# Patient Record
Sex: Male | Born: 1960
Health system: Southern US, Community
[De-identification: ages and names within clinical notes are randomized; demographics above are authoritative.]

## PROBLEM LIST (undated history)

## (undated) DIAGNOSIS — S42009A Fracture of unspecified part of unspecified clavicle, initial encounter for closed fracture: Secondary | ICD-10-CM

## (undated) DIAGNOSIS — S42401A Unspecified fracture of lower end of right humerus, initial encounter for closed fracture: Secondary | ICD-10-CM

## (undated) DIAGNOSIS — F339 Major depressive disorder, recurrent, unspecified: Secondary | ICD-10-CM

## (undated) DIAGNOSIS — J439 Emphysema, unspecified: Secondary | ICD-10-CM

## (undated) DIAGNOSIS — M1990 Unspecified osteoarthritis, unspecified site: Secondary | ICD-10-CM

## (undated) DIAGNOSIS — C801 Malignant (primary) neoplasm, unspecified: Secondary | ICD-10-CM

## (undated) DIAGNOSIS — K5790 Diverticulosis of intestine, part unspecified, without perforation or abscess without bleeding: Secondary | ICD-10-CM

## (undated) DIAGNOSIS — K649 Unspecified hemorrhoids: Secondary | ICD-10-CM

## (undated) DIAGNOSIS — D126 Benign neoplasm of colon, unspecified: Secondary | ICD-10-CM

## (undated) DIAGNOSIS — J449 Chronic obstructive pulmonary disease, unspecified: Secondary | ICD-10-CM

## (undated) HISTORY — DX: Chronic obstructive pulmonary disease, unspecified: J44.9

## (undated) HISTORY — DX: Major depressive disorder, recurrent, unspecified: F33.9

## (undated) HISTORY — DX: Emphysema, unspecified: J43.9

## (undated) HISTORY — DX: Fracture of unspecified part of unspecified clavicle, initial encounter for closed fracture: S42.009A

## (undated) HISTORY — DX: Benign neoplasm of colon, unspecified: D12.6

## (undated) HISTORY — DX: Unspecified fracture of lower end of right humerus, initial encounter for closed fracture: S42.401A

## (undated) HISTORY — DX: Unspecified osteoarthritis, unspecified site: M19.90

## (undated) HISTORY — PX: POLYPECTOMY: SHX149

## (undated) HISTORY — PX: JOINT REPLACEMENT: SHX530

## (undated) HISTORY — DX: Unspecified hemorrhoids: K64.9

## (undated) HISTORY — DX: Diverticulosis of intestine, part unspecified, without perforation or abscess without bleeding: K57.90

## (undated) HISTORY — PX: COLONOSCOPY: SHX174

---
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
## 1981-10-28 HISTORY — PX: WISDOM TOOTH EXTRACTION: SHX21

## 1984-10-28 DIAGNOSIS — S42009A Fracture of unspecified part of unspecified clavicle, initial encounter for closed fracture: Secondary | ICD-10-CM

## 1984-10-28 HISTORY — DX: Fracture of unspecified part of unspecified clavicle, initial encounter for closed fracture: S42.009A

## 2010-12-28 ENCOUNTER — Ambulatory Visit: Payer: Self-pay | Admitting: Internal Medicine

## 2011-07-08 ENCOUNTER — Other Ambulatory Visit: Payer: Self-pay | Admitting: Internal Medicine

## 2011-07-13 ENCOUNTER — Other Ambulatory Visit: Payer: Self-pay | Admitting: Internal Medicine

## 2011-10-29 HISTORY — PX: OTHER SURGICAL HISTORY: SHX169

## 2012-02-21 ENCOUNTER — Encounter: Payer: Self-pay | Admitting: Internal Medicine

## 2012-02-21 ENCOUNTER — Ambulatory Visit (INDEPENDENT_AMBULATORY_CARE_PROVIDER_SITE_OTHER): Payer: BC Managed Care – PPO | Admitting: Internal Medicine

## 2012-02-21 VITALS — BP 130/72 | HR 82 | Temp 98.1°F | Ht 68.5 in | Wt 154.0 lb

## 2012-02-21 DIAGNOSIS — F419 Anxiety disorder, unspecified: Secondary | ICD-10-CM | POA: Insufficient documentation

## 2012-02-21 DIAGNOSIS — F411 Generalized anxiety disorder: Secondary | ICD-10-CM

## 2012-02-21 DIAGNOSIS — H6691 Otitis media, unspecified, right ear: Secondary | ICD-10-CM | POA: Insufficient documentation

## 2012-02-21 DIAGNOSIS — H6692 Otitis media, unspecified, left ear: Secondary | ICD-10-CM

## 2012-02-21 DIAGNOSIS — Z0000 Encounter for general adult medical examination without abnormal findings: Secondary | ICD-10-CM

## 2012-02-21 DIAGNOSIS — H6690 Otitis media, unspecified, unspecified ear: Secondary | ICD-10-CM

## 2012-02-21 MED ORDER — AMOXICILLIN-POT CLAVULANATE 875-125 MG PO TABS: 1.0000 | ORAL_TABLET | Freq: Two times a day (BID) | ORAL | Status: AC

## 2012-02-21 MED ORDER — BUPROPION HCL ER (XL) 150 MG PO TB24: 150.0000 mg | ORAL_TABLET | Freq: Every day | ORAL | Status: DC

## 2012-02-21 MED ORDER — CLONAZEPAM 0.5 MG PO TABS: 0.5000 mg | ORAL_TABLET | Freq: Three times a day (TID) | ORAL | Status: DC | PRN

## 2012-02-21 MED ORDER — ANTIPYRINE-BENZOCAINE 5.4-1.4 % OT SOLN: 3.0000 [drp] | OTIC | Status: AC | PRN

## 2012-02-21 NOTE — Assessment & Plan Note (Signed)
Will restart Wellbutrin and Klonopin as this has worked well for him in the past. Followup in 3 months or as needed.

## 2012-02-21 NOTE — Progress Notes (Signed)
Subjective:    Patient ID: Paul Fields, male    DOB: [birth year], 51 y.o.   MRN: [id number]  HPI 51 year old male with history of anxiety presents for followup. In regards to his anxiety, he has recently been out of his Wellbutrin and Klonopin. He would like to restart these medications, as he notes significant improvement in his anxiety in the past while using them. He reports that his home situation has improved somewhat since his last visit. He also notes that things are going well at work.  He is concerned today about right ear pain. This has been persistent for approximately one week. He notes a history in the past of recurrent ear infections in his right ear. He denies any fever or chills. He reports that the pain is worse when lying down on his right side. He denies any drainage from his ear. He denies any nasal congestion.  Outpatient Encounter Prescriptions as of 02/21/2012  Medication Sig Dispense Refill  . acetaminophen (TYLENOL) 500 MG tablet Take 500 mg by mouth every 6 (six) hours as needed.      . clonazePAM (KLONOPIN) 0.5 MG tablet Take 1 tablet (0.5 mg total) by mouth 3 (three) times daily as needed for anxiety.  60 tablet  3  . Naproxen Sodium (ALEVE) 220 MG CAPS Take 1 capsule by mouth as needed.      Marland Kitchen DISCONTD: clonazePAM (KLONOPIN) 0.5 MG tablet TAKE ONE TABLET BY MOUTH TWICE DAILY AS NEEDED  60 tablet  5  . amoxicillin-clavulanate (AUGMENTIN) 875-125 MG per tablet Take 1 tablet by mouth 2 (two) times daily.  20 tablet  0  . antipyrine-benzocaine (AURALGAN) otic solution Place 3 drops into both ears every 2 (two) hours as needed for pain.  10 mL  0  . buPROPion (WELLBUTRIN XL) 150 MG 24 hr tablet Take 1 tablet (150 mg total) by mouth daily.  30 tablet  6   BP 130/72  Pulse 82  Temp(Src) 98.1 F (36.7 C) (Oral)  Ht 5' 8.5" (1.74 m)  Wt 154 lb (69.854 kg)  BMI 23.08 kg/m2  SpO2 96%  Review of Systems  Constitutional: Negative for fever, chills, activity change,  appetite change, fatigue and unexpected weight change.  HENT: Positive for ear pain. Negative for congestion, rhinorrhea, postnasal drip and sinus pressure.   Eyes: Negative for visual disturbance.  Respiratory: Negative for cough and shortness of breath.   Cardiovascular: Negative for chest pain, palpitations and leg swelling.  Gastrointestinal: Negative for abdominal pain and abdominal distention.  Genitourinary: Negative for dysuria, urgency and difficulty urinating.  Musculoskeletal: Negative for arthralgias and gait problem.  Skin: Negative for color change and rash.  Hematological: Negative for adenopathy.  Psychiatric/Behavioral: Negative for sleep disturbance and dysphoric mood. The patient is nervous/anxious.        Objective:   Physical Exam  Constitutional: He is oriented to person, place, and time. He appears well-developed and well-nourished. No distress.  HENT:  Head: Normocephalic and atraumatic.  Right Ear: External ear normal.  Left Ear: External ear normal.  Nose: Nose normal.  Mouth/Throat: Oropharynx is clear and moist. No oropharyngeal exudate.       Both ear canals tortuous with some hair and cerumen obstructing view of TM.  Eyes: Conjunctivae and EOM are normal. Pupils are equal, round, and reactive to light. Right eye exhibits no discharge. Left eye exhibits no discharge. No scleral icterus.  Neck: Normal range of motion. Neck supple. No tracheal deviation present. No thyromegaly present.  Cardiovascular: Normal rate, regular rhythm and normal heart sounds.  Exam reveals no gallop and no friction rub.   No murmur heard. Pulmonary/Chest: Effort normal and breath sounds normal. No respiratory distress. He has no wheezes. He has no rales. He exhibits no tenderness.  Musculoskeletal: Normal range of motion. He exhibits no edema.  Lymphadenopathy:    He has no cervical adenopathy.  Neurological: He is alert and oriented to person, place, and time. No cranial nerve  deficit. Coordination normal.  Skin: Skin is warm and dry. No rash noted. He is not diaphoretic. No erythema. No pallor.  Psychiatric: He has a normal mood and affect. His behavior is normal. Judgment and thought content normal.          Assessment & Plan:

## 2012-02-21 NOTE — Assessment & Plan Note (Addendum)
Symptoms are most consistent with otitis media of the right ear. However, visualization of the TM is very difficult because of tortuous canal and some cerumen buildup. Will set him up with ENT for further evaluation. We will start Augmentin x10 days.

## 2012-05-22 ENCOUNTER — Other Ambulatory Visit (INDEPENDENT_AMBULATORY_CARE_PROVIDER_SITE_OTHER): Payer: BC Managed Care – PPO | Admitting: *Deleted

## 2012-05-22 DIAGNOSIS — Z0000 Encounter for general adult medical examination without abnormal findings: Secondary | ICD-10-CM

## 2012-05-22 LAB — COMPREHENSIVE METABOLIC PANEL
ALT: 19 U/L (ref 0–53)
Albumin: 4.2 g/dL (ref 3.5–5.2)
Alkaline Phosphatase: 49 U/L (ref 39–117)
Glucose, Bld: 102 mg/dL — ABNORMAL HIGH (ref 70–99)
Potassium: 4.1 mEq/L (ref 3.5–5.1)
Sodium: 137 mEq/L (ref 135–145)
Total Protein: 6.8 g/dL (ref 6.0–8.3)

## 2012-05-22 LAB — LIPID PANEL
Cholesterol: 169 mg/dL (ref 0–200)
LDL Cholesterol: 96 mg/dL (ref 0–99)
Total CHOL/HDL Ratio: 3

## 2012-05-27 ENCOUNTER — Encounter: Payer: Self-pay | Admitting: Internal Medicine

## 2012-05-27 ENCOUNTER — Ambulatory Visit (INDEPENDENT_AMBULATORY_CARE_PROVIDER_SITE_OTHER): Payer: BC Managed Care – PPO | Admitting: Internal Medicine

## 2012-05-27 VITALS — BP 140/80 | HR 78 | Temp 98.7°F | Ht 68.5 in | Wt 155.0 lb

## 2012-05-27 DIAGNOSIS — R233 Spontaneous ecchymoses: Secondary | ICD-10-CM

## 2012-05-27 DIAGNOSIS — Z0000 Encounter for general adult medical examination without abnormal findings: Secondary | ICD-10-CM | POA: Insufficient documentation

## 2012-05-27 DIAGNOSIS — Z720 Tobacco use: Secondary | ICD-10-CM

## 2012-05-27 DIAGNOSIS — F419 Anxiety disorder, unspecified: Secondary | ICD-10-CM

## 2012-05-27 DIAGNOSIS — Z1211 Encounter for screening for malignant neoplasm of colon: Secondary | ICD-10-CM

## 2012-05-27 DIAGNOSIS — F17200 Nicotine dependence, unspecified, uncomplicated: Secondary | ICD-10-CM

## 2012-05-27 DIAGNOSIS — F17210 Nicotine dependence, cigarettes, uncomplicated: Secondary | ICD-10-CM | POA: Insufficient documentation

## 2012-05-27 DIAGNOSIS — Z23 Encounter for immunization: Secondary | ICD-10-CM

## 2012-05-27 DIAGNOSIS — F411 Generalized anxiety disorder: Secondary | ICD-10-CM

## 2012-05-27 MED ORDER — VARENICLINE TARTRATE 0.5 MG X 11 & 1 MG X 42 PO MISC: ORAL | Status: AC

## 2012-05-27 NOTE — Assessment & Plan Note (Signed)
Patient reports spontaneous bruising over his arms however no abnormalities noted on exam today. Will check CBC and PTT with labs.

## 2012-05-27 NOTE — Assessment & Plan Note (Signed)
Encourage smoking cessation. Patient would like to try Chantix. Prescription written today. Patient will followup in 3 months.

## 2012-05-27 NOTE — Assessment & Plan Note (Signed)
General medical exam is normal today. Health maintenance is up to date except for colonoscopy which will be scheduled. Tetanus and pertussis vaccine given today. Pneumonia vaccine given today. Recent fasting lab work including kidney, liver function and cholesterol were normal.
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
## 2012-05-27 NOTE — Assessment & Plan Note (Signed)
Symptoms well controlled with current medications. Will continue. Followup in 3 months worsening or as needed.

## 2012-05-28 ENCOUNTER — Encounter: Payer: Self-pay | Admitting: Internal Medicine

## 2012-05-29 ENCOUNTER — Other Ambulatory Visit (INDEPENDENT_AMBULATORY_CARE_PROVIDER_SITE_OTHER): Payer: BC Managed Care – PPO | Admitting: *Deleted

## 2012-05-29 DIAGNOSIS — R233 Spontaneous ecchymoses: Secondary | ICD-10-CM

## 2012-05-29 LAB — CBC WITH DIFFERENTIAL/PLATELET
Basophils Absolute: 0 10*3/uL (ref 0.0–0.1)
Basophils Relative: 1 % (ref 0–1)
Eosinophils Relative: 2 % (ref 0–5)
Lymphocytes Relative: 28 % (ref 12–46)
Neutro Abs: 3.4 10*3/uL (ref 1.7–7.7)
Platelets: 203 10*3/uL (ref 150–400)
RDW: 13.4 % (ref 11.5–15.5)
WBC: 5.8 10*3/uL (ref 4.0–10.5)

## 2012-05-29 NOTE — Addendum Note (Signed)
Addended by: Jobie Quaker on: 05/29/2012 04:55 PM   Modules accepted: Orders

## 2012-05-30 LAB — APTT: aPTT: 30 seconds (ref 24–37)

## 2012-06-26 ENCOUNTER — Ambulatory Visit (AMBULATORY_SURGERY_CENTER): Payer: BC Managed Care – PPO

## 2012-06-26 ENCOUNTER — Encounter: Payer: Self-pay | Admitting: Internal Medicine

## 2012-06-26 VITALS — Ht 69.0 in | Wt 158.8 lb

## 2012-06-26 DIAGNOSIS — Z1211 Encounter for screening for malignant neoplasm of colon: Secondary | ICD-10-CM

## 2012-06-26 MED ORDER — NA SULFATE-K SULFATE-MG SULF 17.5-3.13-1.6 GM/177ML PO SOLN: 1.0000 | Freq: Once | ORAL | Status: DC

## 2012-07-10 ENCOUNTER — Encounter: Payer: Self-pay | Admitting: Internal Medicine

## 2012-07-10 ENCOUNTER — Ambulatory Visit (AMBULATORY_SURGERY_CENTER): Payer: BC Managed Care – PPO | Admitting: Internal Medicine

## 2012-07-10 ENCOUNTER — Encounter: Payer: Self-pay | Admitting: *Deleted

## 2012-07-10 VITALS — BP 144/75 | HR 73 | Temp 97.1°F | Resp 18 | Ht 68.0 in | Wt 160.0 lb

## 2012-07-10 DIAGNOSIS — K635 Polyp of colon: Secondary | ICD-10-CM

## 2012-07-10 DIAGNOSIS — D126 Benign neoplasm of colon, unspecified: Secondary | ICD-10-CM

## 2012-07-10 DIAGNOSIS — Z1211 Encounter for screening for malignant neoplasm of colon: Secondary | ICD-10-CM

## 2012-07-10 MED ORDER — SODIUM CHLORIDE 0.9 % IV SOLN: 500.0000 mL | INTRAVENOUS | Status: DC

## 2012-07-10 NOTE — Patient Instructions (Addendum)
Handouts were given to your care partner on polyps, diverticulosis, high fiber diet and hemorrhoids.  Please hold any aspirin, aspirin products and any anti-inflammatory medications for 3 weeks until 07/31/12 per Dr. Rhea Belton. You may resume other current medications today.  Please call first of next week to schedule a follow up appointment with Dr.  Rhea Belton for 1 month.  Please try to stop smoking.  Assistance phone numbers are on the picture page if needed. Please call if any questions or concerns.    YOU HAD AN ENDOSCOPIC PROCEDURE TODAY AT THE [facility]: Refer to the procedure report that was given to you for any specific questions about what was found during the examination.  If the procedure report does not answer your questions, please call your gastroenterologist to clarify.  If you requested that your care partner not be given the details of your procedure findings, then the procedure report has been included in a sealed envelope for you to review at your convenience later.  YOU SHOULD EXPECT: Some feelings of bloating in the abdomen. Passage of more gas than usual.  Walking can help get rid of the air that was put into your GI tract during the procedure and reduce the bloating. If you had a lower endoscopy (such as a colonoscopy or flexible sigmoidoscopy) you may notice spotting of blood in your stool or on the toilet paper. If you underwent a bowel prep for your procedure, then you may not have a normal bowel movement for a few days.  DIET: Your first meal following the procedure should be a light meal and then it is ok to progress to your normal diet.  A half-sandwich or bowl of soup is an example of a good first meal.  Heavy or fried foods are harder to digest and may make you feel nauseous or bloated.  Likewise meals heavy in dairy and vegetables can cause extra gas to form and this can also increase the bloating.  Drink plenty of fluids but you should avoid alcoholic beverages for  24 hours.  ACTIVITY: Your care partner should take you home directly after the procedure.  You should plan to take it easy, moving slowly for the rest of the day.  You can resume normal activity the day after the procedure however you should NOT DRIVE or use heavy machinery for 24 hours (because of the sedation medicines used during the test).    SYMPTOMS TO REPORT IMMEDIATELY: A gastroenterologist can be reached at any hour.  During normal business hours, 8:30 AM to 5:00 PM Monday through Friday, call [phone].  After hours and on weekends, please call the GI answering service at [phone] who will take a message and have the physician on call contact you.   Following lower endoscopy (colonoscopy or flexible sigmoidoscopy):  Excessive amounts of blood in the stool  Significant tenderness or worsening of abdominal pains  Swelling of the abdomen that is new, acute  Fever of 100F or higher    FOLLOW UP: If any biopsies were taken you will be contacted by phone or by letter within the next 1-3 weeks.  Call your gastroenterologist if you have not heard about the biopsies in 3 weeks.  Our staff will call the home number listed on your records the next business day following your procedure to check on you and address any questions or concerns that you may have at that time regarding the information given to you following your procedure. This is a  courtesy call and so if there is no answer at the home number and we have not heard from you through the emergency physician on call, we will assume that you have returned to your regular daily activities without incident.  SIGNATURES/CONFIDENTIALITY: You and/or your care partner have signed paperwork which will be entered into your electronic medical record.  These signatures attest to the fact that that the information above on your After Visit Summary has been reviewed and is understood.  Full responsibility of the confidentiality of this  discharge information lies with you and/or your care-partner.

## 2012-07-10 NOTE — Progress Notes (Signed)
PROPOFOL PER D MERRITT CRNA, ALL MEDS TITRATED PER CRNA DURING PROCEDURE. SEE SCANNED INTRA PROCEDURE REPORT. EWM 

## 2012-07-10 NOTE — Progress Notes (Signed)
No complaints noted in the recovery room. Maw  Patient did not experience any of the following events: a burn prior to discharge; a fall within the facility; wrong site/side/patient/procedure/implant event; or a hospital transfer or hospital admission upon discharge from the facility. (G8907) Patient did not have preoperative order for IV antibiotic SSI prophylaxis. (G8918)  

## 2012-07-10 NOTE — Op Note (Signed)
[facility] [address] [place], [address]   COLONOSCOPY PROCEDURE REPORT  PATIENT: Paul, Fields  MR#: [id number] BIRTHDATE: 18-Mar-1961 , 51  yrs. old GENDER: Male ENDOSCOPIST: Beverley Fiedler, MD REFERRED [id number], Jennifer PROCEDURE DATE:  07/10/2012 PROCEDURE:   Colonoscopy with snare polypectomy and Submucosal injection, any substance ASA CLASS:   Class III INDICATIONS:average risk screening. MEDICATIONS: MAC sedation, administered by CRNA and Propofol (Diprivan) 600 mg IV  DESCRIPTION OF PROCEDURE:   After the risks benefits and alternatives of the procedure were thoroughly explained, informed consent was obtained.  A digital rectal exam revealed no rectal mass.   The LB [id number]  endoscope was introduced through the anus and advanced to the cecum, which was identified by both the appendix and ileocecal valve. No adverse events experienced. The quality of the prep was Suprep good  The instrument was then slowly withdrawn as the colon was fully examined.      COLON FINDINGS: A sessile polyp measuring 10 mm in size was found at the cecum at the appendiceal orifice.  A saline + indigo carmine Injection was given to lift the mucosal wall.  A polypectomy was performed using snare cautery in piecemeal fashion  The resection was complete and the polyp tissue was completely retrieved.   A sessile polyp measuring 5 mm in size was found at the cecum.  A polypectomy was performed with a cold snare.  The resection was complete and the polyp tissue was completely retrieved.   A flat polyp measuring 15 mm in size was found in the ascending colon.  A saline + indigo carmine Injection was given to lift the mucosal wall.  A polypectomy was performed using snare cautery.  The resection was complete and the polyp tissue was completely retrieved.   Four sessile polyps measuring 5-10 mm in size were found in the ascending colon.  Polypectomy was performed  using cold snare and using hot snare.  All resections were complete and all polyp tissue was completely retrieved.   Three sessile polyps measuring 5-9 mm in size were found in the transverse colon. Polypectomy was performed using cold snare and using hot snare. All resections were complete and all polyp tissue was completely retrieved.   Two sessile polyps ranging between 3-19mm in size were found in the descending colon.  Polypectomy was performed using cold snare.  All resections were complete and all polyp tissue was completely retrieved.   Six sessile polyps measuring 5-12 mm in size were found in the sigmoid colon.  Polypectomy was performed using cold snare and using hot snare.  All resections were complete and all polyp tissue was completely retrieved.   Four sessile polyps ranging between 3-17mm in size were found in the rectosigmoid colon and rectum.  Polypectomy was performed using cold snare.  All resections were complete and all polyp tissue was completely retrieved.   Small internal hemorrhoids were found.   Mild diverticulosis was noted in the sigmoid colon.  Retroflexed views revealed internal hemorrhoids. The time to cecum=2 minutes 21 seconds.  Withdrawal time=48 minutes 10 seconds.  The scope was withdrawn and the procedure completed.  COMPLICATIONS: There were no complications.  ENDOSCOPIC IMPRESSION: 1.   Sessile polyp measuring 10 mm in size was found at the cecum.; polypectomy was performed using snare cautery 2.   Sessile polyp measuring 5 mm in size was found at the cecum; polypectomy was performed with a cold snare 3.   Flat polyp measuring 15  mm in size was found in the ascending colon; polypectomy was performed using snare cautery 4.   Four sessile polyps measuring 5-10 mm in size were found in the ascending colon; Polypectomy was performed using cold snare and using hot snare 5.   Three sessile polyps measuring 5-9 mm in size were found in the transverse  colon; Polypectomy was performed using cold snare and using hot snare 6.   Two sessile polyps ranging between 3-65mm in size were found in the descending colon; Polypectomy was performed using cold snare 7.   Six sessile polyps measuring 5-12 mm in size were found in the sigmoid colon; Polypectomy was performed using cold snare and using hot snare 8.   Four sessile polyps ranging between 3-52mm in size were found in the rectosigmoid colon and rectum; Polypectomy was performed using cold snare 9.   Small internal hemorrhoids 10.   Mild diverticulosis was noted in the sigmoid colon  RECOMMENDATIONS: 1.  Hold aspirin, aspirin products, and anti-inflammatory medication for 2 weeks. 2.  Await pathology results 3.  Call to schedule a follow-up appointment with GI Clinic 1 month 4.  You will receive a letter within 1-2 weeks with the results of your biopsy as well as final recommendations.  Please call my office if you have not received a letter after 3 weeks. 5.  One of your biggest health concerns is your smoking.  This increases your risk for most cancers and serious cardiovascular diseases such as strokes, heart attacks.  You should try your best to stop.  If you need assistance, please contact your PCP or Smoking Cessation Class at [facility] [phone]) or [facility] Quit-Line (1-800-QUIT-NOW). 6..   Repeat colonoscopy in 3-6 months given numerous polyps removed (1 in piecemeal fashion).   eSigned:  Beverley Fiedler, MD 07/10/2012 11:32 AM   cc: Ronna Polio MD and The Patient   PATIENT NAME:  Paul Fields, Paul Fields MR#: [id number]

## 2012-07-13 ENCOUNTER — Telehealth: Payer: Self-pay | Admitting: *Deleted

## 2012-07-13 NOTE — Telephone Encounter (Signed)
  Follow up Call-  Call back number 07/10/2012  Post procedure Call Back phone  # [phone]  Permission to leave phone message Yes     Left message.

## 2012-07-14 ENCOUNTER — Encounter: Payer: Self-pay | Admitting: Internal Medicine

## 2012-07-15 ENCOUNTER — Telehealth: Payer: Self-pay | Admitting: *Deleted

## 2012-07-15 DIAGNOSIS — K635 Polyp of colon: Secondary | ICD-10-CM

## 2012-07-15 NOTE — Telephone Encounter (Signed)
Informed pt of his referral to a [employer] [facility] d/t his numerous polyps; pt stated understanding.

## 2012-07-15 NOTE — Telephone Encounter (Signed)
Message copied by Florene Glen on Wed Jul 15, 2012  9:03 AM ------      Message from: Beverley Fiedler      Created: Tue Jul 14, 2012  2:59 PM       Multiple tubular adenomas on first colonoscopy.      Please refer to genetics for evaluation of polyposis syndrome/hereditary polyp syndrome.      Letter sent

## 2012-08-10 ENCOUNTER — Encounter: Payer: Self-pay | Admitting: Internal Medicine

## 2012-08-11 ENCOUNTER — Telehealth: Payer: Self-pay | Admitting: Genetic Counselor

## 2012-08-11 ENCOUNTER — Encounter: Payer: Self-pay | Admitting: Internal Medicine

## 2012-08-11 ENCOUNTER — Ambulatory Visit (INDEPENDENT_AMBULATORY_CARE_PROVIDER_SITE_OTHER): Payer: BC Managed Care – PPO | Admitting: Internal Medicine

## 2012-08-11 VITALS — BP 130/80 | HR 70 | Ht 68.5 in | Wt 158.6 lb

## 2012-08-11 DIAGNOSIS — Z86010 Personal history of colonic polyps: Secondary | ICD-10-CM

## 2012-08-11 DIAGNOSIS — Z720 Tobacco use: Secondary | ICD-10-CM

## 2012-08-11 DIAGNOSIS — F17200 Nicotine dependence, unspecified, uncomplicated: Secondary | ICD-10-CM

## 2012-08-11 NOTE — Progress Notes (Signed)
Patient ID: Paul Fields, male   DOB: Jul 22, 1961, 51 y.o.   MRN: [id number]  SUBJECTIVE: HPI [name] is a 51 yo male with PMH of multiple adenomatous colon polyps, mild COPD, and tobacco use who is seen in followup after colonoscopy. He is accompanied today by his wife. He had a colonoscopy on 07/10/2012 for multiple adenomatous colon polyps removed throughout his colon. He had a piecemeal polypectomy in his cecum. He reports he did well after the procedure without bleeding. He denies abdominal pain today. No nausea or vomiting. Appetite is good and weight is stable. His bowel patterns are unchanged which for him this every other day bowel movements which are more loose than formed but without blood or melena.  He is not aware of any family history of colon cancer or polyps. He has 3 children all younger than age 62. No fevers or chills. He is smoking but plans on starting Chantix soon. He has tried this in the past with some success but relapsed and began smoking again. He was referred to genetics but never found out about the appointment. This was rescheduled for him today for early December  Review of Systems  As per history of present illness, otherwise negative   Past Medical History  Diagnosis Date  . Elbow fracture, right 1968  . Clavicle fracture 1986  . COPD (chronic obstructive pulmonary disease)   . Diverticulosis   . Hemorrhoid     Current Outpatient Prescriptions  Medication Sig Dispense Refill  . acetaminophen (TYLENOL) 500 MG tablet Take 500 mg by mouth every 6 (six) hours as needed.      Marland Kitchen buPROPion (WELLBUTRIN XL) 150 MG 24 hr tablet Take 1 tablet (150 mg total) by mouth daily.  30 tablet  6  . clonazePAM (KLONOPIN) 0.5 MG tablet Take 1 tablet (0.5 mg total) by mouth 3 (three) times daily as needed for anxiety.  60 tablet  3  . Naproxen Sodium (ALEVE) 220 MG CAPS Take 1 capsule by mouth as needed.      . varenicline (CHANTIX STARTING MONTH PAK) 0.5 MG X 11 & 1 MG X 42  tablet Take one 0.5 mg tablet by mouth once daily for 3 days, then increase to one 0.5 mg tablet twice daily for 4 days, then increase to one 1 mg tablet twice daily.  53 tablet  0    No Known Allergies  Family History  Problem Relation Age of Onset  . COPD Father   . Colon cancer Neg Hx   . Parkinsonism Mother   . Diabetes Mother     borderline    History  Substance Use Topics  . Smoking status: Current Every Day Smoker -- 1.5 packs/day    Types: Cigarettes  . Smokeless tobacco: Never Used   Comment: tobacco info given 08/11/12  . Alcohol Use: 3.6 oz/week    6 Cans of beer per week     6 pk beer on weekends    OBJECTIVE: BP 130/80  Pulse 70  Ht 5' 8.5" (1.74 m)  Wt 158 lb 9.6 oz (71.94 kg)  BMI 23.76 kg/m2 Constitutional: Well-developed and well-nourished. No distress. HEENT: Normocephalic and atraumatic. Oropharynx is clear and moist. No oropharyngeal exudate. Conjunctivae are normal. Pupils are equal round and reactive to light. No scleral icterus. Cardiovascular: Normal rate, regular rhythm and intact distal pulses. No M/R/G Pulmonary/chest: Effort normal and breath sounds normal. No wheezing, rales or rhonchi. Abdominal: Soft, nontender, nondistended. Bowel sounds active throughout. There are  no masses palpable. No hepatosplenomegaly. Extremities: no clubbing, cyanosis, or edema Neurological: Alert and oriented to person place and time. Skin: Skin is warm and dry. No rashes noted. Psychiatric: Normal mood and affect. Behavior is normal.  ASSESSMENT AND PLAN: 51 yo male with PMH of multiple adenomatous colon polyps, mild COPD, and tobacco use who is seen in followup after colonoscopy.  1.  Multiple adenomatous colon polyps/surveillance colonoscopy -- the patient had multiple adenomatous polyps, one removed piecemeal from the cecum. He is aware of my recommendation a repeat the test in 3-6 months to ensure complete resection remove any remaining polyps. He would prefer  this test be done in December 2013, which is within the appropriate time interval. It will be scheduled in December for him. He is aware of our referral to genetics and plans to attend this appointment in December. We will await their recommendations. He is aware that he will be high risk from a screening and surveillance standpoint going forward and his repeat interval will be based on the findings at his next colonoscopy.  2.  Tobacco use -- he is again encouraged to discontinue smoking and is aware of the association of smoking and colon polyps. He plans to start Chantix soon and we were able to get him an insurance savings card from primary care for this medication.

## 2012-08-11 NOTE — Telephone Encounter (Signed)
Referring office called to schedule appt for Genetic pt was in office.  12/02 @ 11 will send out calendar/packet.

## 2012-08-11 NOTE — Patient Instructions (Addendum)
You have been scheduled for a colonoscopy with propofol. Please follow written instructions given to you at your visit today.  Please pick up your prep kit at the pharmacy within the next 1-3 days. If you use inhalers (even only as needed), please bring them with you on the day of your procedure.  

## 2012-09-28 ENCOUNTER — Other Ambulatory Visit: Payer: BC Managed Care – PPO | Admitting: Lab

## 2012-09-28 ENCOUNTER — Ambulatory Visit (HOSPITAL_BASED_OUTPATIENT_CLINIC_OR_DEPARTMENT_OTHER): Payer: BC Managed Care – PPO | Admitting: Genetic Counselor

## 2012-09-28 ENCOUNTER — Encounter: Payer: Self-pay | Admitting: Genetic Counselor

## 2012-09-28 DIAGNOSIS — K635 Polyp of colon: Secondary | ICD-10-CM

## 2012-09-28 DIAGNOSIS — IMO0002 Reserved for concepts with insufficient information to code with codable children: Secondary | ICD-10-CM

## 2012-09-28 DIAGNOSIS — Z8042 Family history of malignant neoplasm of prostate: Secondary | ICD-10-CM

## 2012-09-28 DIAGNOSIS — Z800 Family history of malignant neoplasm of digestive organs: Secondary | ICD-10-CM

## 2012-09-28 DIAGNOSIS — Z86010 Personal history of colonic polyps: Secondary | ICD-10-CM

## 2012-09-28 NOTE — Progress Notes (Signed)
Dr. Erick Blinks requested a consultation for genetic counseling and risk assessment for Paul Fields, a 51 y.o. male, for discussion of his personal history of colon polyps. He presents to clinic today to discuss the possibility of a genetic predisposition to cancer, and to further clarify his risks, as well as his family members' risks for cancer.   HISTORY OF PRESENT ILLNESS: Paul Fields is a 51 y.o. male with no personal history of cancer.  A colonoscopy in September 2013 found innumerable polyps.  A total of 20 were biopsied and found to be tubular adenomas.  Past Medical History  Diagnosis Date  . Elbow fracture, right 1968  . Clavicle fracture 1986  . COPD (chronic obstructive pulmonary disease)   . Diverticulosis   . Hemorrhoid     Past Surgical History  Procedure Date  . Elbow fracture surgery     Pins, then hardware removal  . Elbow hardware removal     right  . Wisdom tooth extraction   . Polyps 2013    20 polyps biopsies as tubular adenomas    History  Substance Use Topics  . Smoking status: Current Every Day Smoker -- 1.5 packs/day for 36 years    Types: Cigarettes  . Smokeless tobacco: Never Used     Comment: tobacco info given 08/11/12  . Alcohol Use: 3.6 oz/week    6 Cans of beer per week     Comment: 6 pk beer on weekends    PERSONAL RISK ASSESSMENT FACTORS: Colonoscopy in late 20s for bleeding - no polyps found Colonoscopy September 2013 - innumerable polyps, 20 bx - tubular adenomas Current, every day smoker - for 36 years.  Heaviest smoking 3 packs per day   FAMILY HISTORY:  We obtained a detailed, 4-generation family history.  Significant diagnoses are listed below: Family History  Problem Relation Age of Onset  . COPD Father   . Parkinsonism Mother   . Diabetes Mother     borderline  . Colon cancer Maternal Uncle     diagnosed in his 60s  . Alzheimer's disease Paternal Aunt   . COPD Paternal Uncle   . Lung cancer Maternal Grandfather       Forensic scientist  . Prostate cancer Maternal Uncle     diagnosed in his 71s  . Alzheimer's disease Paternal Aunt   . Heart attack Paternal Uncle   . Brain cancer Paternal Uncle 72  . Lymphoma Cousin     paternal cousin  The patient was found to have 20 tubular adenoms.  His mother was diagnosed at age 49 with cervical cancer.  She had two brothers - one developed colon cancer in his 46s and then again in his 34s and he died at 71.  The other is 25 and has prostate cancer.  The patient's maternal grandfather was diagnosed with lung cancer, but was a Forensic scientist. The patient's father had COPD and he thinks possibly lung cancer.  His father had three brothers and two sisters.  One brother has a diangosis of brain cancer at age 80.  Two brothers died and the two sisters have dementia.  The patient has a paternal cousin with lymphoma.  There is no other reported history of cancer.  Patient's maternal ancestors are of [place] descent, and paternal ancestors are of [place], [place] and [place] descent. There is no reported Ashkenazi Jewish ancestry. There is no known consanguinity.  GENETIC COUNSELING RISK ASSESSMENT, DISCUSSION, AND SUGGESTED FOLLOW UP: We reviewed the natural  history and genetic etiology of sporadic, familial and hereditary cancer syndromes. About 10% of colon cancer is hereditary.  The most common form of colon cancer associated with overwhelming polyps is a result of mutations in the APC gene.  We discussed that there are multiple hereditary polyposis genes and that we could test them in a panel.  We will consider the colon cancer panel from [facility].  The patient's personal history of colon polyps and family history of colon cancer is suggestive of the following possible diagnosis: hereditary colon cancer syndrome  We discussed that identification of a hereditary cancer syndrome may help his care providers tailor the patients medical management. If a mutation indicating hereditary  cancer syndrome is detected in this case, the [employer] recommendations would include increased cancer surveillance and possible prophylactic surgery. If a mutation is detected, the patient will be referred back to the referring provider and to any additional appropriate care providers to discuss the relevant options.   If a mutation is not found in the patient, this will decrease the likelihood of a hereditary colon cancer syndrome as the explanation for his colon polyps. Cancer surveillance options would be discussed for the patient according to the appropriate standard National Comprehensive Cancer Network and American Cancer Society guidelines, with consideration of their personal and family history risk factors. In this case, the patient will be referred back to their care providers for discussions of management.   After considering the risks, benefits, and limitations, the patient provided informed consent for  the following  testing: Colon Cancer Panel through [facility].   Per the patient's request, we will contact him by telephone to discuss these results. A follow up genetic counseling visit will be scheduled if indicated.  The patient was seen for a total of 60 minutes, greater than 50% of which was spent face-to-face counseling.  This plan is being carried out per Dr. Erick Blinks recommendations.  This note will also be sent to the referring provider via the electronic medical record. The patient will be supplied with a summary of this genetic counseling discussion as well as educational information on the discussed hereditary cancer syndromes following the conclusion of their visit.   Patient was discussed with Dr. Drue Second.   _______________________________________________________________________ For Office Staff:  Number of people involved in session: 3 Was an Intern/ student involved with case: no

## 2012-10-12 ENCOUNTER — Ambulatory Visit (AMBULATORY_SURGERY_CENTER): Payer: BC Managed Care – PPO | Admitting: Internal Medicine

## 2012-10-12 ENCOUNTER — Encounter: Payer: Self-pay | Admitting: Internal Medicine

## 2012-10-12 VITALS — BP 121/69 | HR 57 | Temp 97.9°F | Resp 15 | Ht 68.5 in | Wt 158.0 lb

## 2012-10-12 DIAGNOSIS — Z86010 Personal history of colonic polyps: Secondary | ICD-10-CM

## 2012-10-12 DIAGNOSIS — D126 Benign neoplasm of colon, unspecified: Secondary | ICD-10-CM

## 2012-10-12 MED ORDER — SODIUM CHLORIDE 0.9 % IV SOLN: 500.0000 mL | INTRAVENOUS | Status: DC

## 2012-10-12 NOTE — Progress Notes (Signed)
Called to room to assist during endoscopic procedure.  Patient ID and intended procedure confirmed with present staff. Received instructions for my participation in the procedure from the performing physician.  

## 2012-10-12 NOTE — Progress Notes (Signed)
Patient did not experience any of the following events: a burn prior to discharge; a fall within the facility; wrong site/side/patient/procedure/implant event; or a hospital transfer or hospital admission upon discharge from the facility. (G8907) Patient did not have preoperative order for IV antibiotic SSI prophylaxis. (G8918)  

## 2012-10-12 NOTE — Patient Instructions (Signed)
Colon polyps removed today. Hold aspirin, aspirin products, and anti-inflammatory medications for 2 weeks!! Repeat colonoscopy in 1 year. Call us with any questions or concerns. Thank you!!  YOU HAD AN ENDOSCOPIC PROCEDURE TODAY AT THE [facility]: Refer to the procedure report that was given to you for any specific questions about what was found during the examination.  If the procedure report does not answer your questions, please call your gastroenterologist to clarify.  If you requested that your care partner not be given the details of your procedure findings, then the procedure report has been included in a sealed envelope for you to review at your convenience later.  YOU SHOULD EXPECT: Some feelings of bloating in the abdomen. Passage of more gas than usual.  Walking can help get rid of the air that was put into your GI tract during the procedure and reduce the bloating. If you had a lower endoscopy (such as a colonoscopy or flexible sigmoidoscopy) you may notice spotting of blood in your stool or on the toilet paper. If you underwent a bowel prep for your procedure, then you may not have a normal bowel movement for a few days.  DIET: Your first meal following the procedure should be a light meal and then it is ok to progress to your normal diet.  A half-sandwich or bowl of soup is an example of a good first meal.  Heavy or fried foods are harder to digest and may make you feel nauseous or bloated.  Likewise meals heavy in dairy and vegetables can cause extra gas to form and this can also increase the bloating.  Drink plenty of fluids but you should avoid alcoholic beverages for 24 hours.  ACTIVITY: Your care partner should take you home directly after the procedure.  You should plan to take it easy, moving slowly for the rest of the day.  You can resume normal activity the day after the procedure however you should NOT DRIVE or use heavy machinery for 24 hours (because of the sedation  medicines used during the test).    SYMPTOMS TO REPORT IMMEDIATELY: A gastroenterologist can be reached at any hour.  During normal business hours, 8:30 AM to 5:00 PM Monday through Friday, call [phone].  After hours and on weekends, please call the GI answering service at [phone] who will take a message and have the physician on call contact you.   Following lower endoscopy (colonoscopy or flexible sigmoidoscopy):  Excessive amounts of blood in the stool  Significant tenderness or worsening of abdominal pains  Swelling of the abdomen that is new, acute  Fever of 100F or higher  FOLLOW UP: If any biopsies were taken you will be contacted by phone or by letter within the next 1-3 weeks.  Call your gastroenterologist if you have not heard about the biopsies in 3 weeks.  Our staff will call the home number listed on your records the next business day following your procedure to check on you and address any questions or concerns that you may have at that time regarding the information given to you following your procedure. This is a courtesy call and so if there is no answer at the home number and we have not heard from you through the emergency physician on call, we will assume that you have returned to your regular daily activities without incident.  SIGNATURES/CONFIDENTIALITY: You and/or your care partner have signed paperwork which will be entered into your electronic medical record.  These  signatures attest to the fact that that the information above on your After Visit Summary has been reviewed and is understood.  Full responsibility of the confidentiality of this discharge information lies with you and/or your care-partner.

## 2012-10-12 NOTE — Op Note (Signed)
[facility] [address] [place], [address]   COLONOSCOPY PROCEDURE REPORT  PATIENT: Paul Fields, Paul Fields  MR#: [id number] BIRTHDATE: 10/13/1961 , 51  yrs. old GENDER: Male ENDOSCOPIST: Beverley Fiedler, MD REFERRED BY: PROCEDURE DATE:  10/12/2012 PROCEDURE:   Colonoscopy with snare polypectomy ASA CLASS:   Class III INDICATIONS:Patient's personal history of adenomatous colon polyps and follow up for previously diagnosed colonic polyps. MEDICATIONS: MAC sedation, administered by CRNA and Propofol (Diprivan) 480 mg IV  DESCRIPTION OF PROCEDURE:   After the risks benefits and alternatives of the procedure were thoroughly explained, informed consent was obtained.  A digital rectal exam revealed no rectal mass.   The LB [id number]  endoscope was introduced through the anus and advanced to the cecum, which was identified by both the appendix and ileocecal valve. No adverse events experienced. The quality of the prep was Moviprep fair  The instrument was then slowly withdrawn as the colon was fully examined.      COLON FINDINGS: The colonic mucosa appeared normal at the appendiceal orifice (previous site of piecemeal polypectomy). Multiple biopsies of the area were performed.   A sessile polyp measuring 5 mm in size was found in the ascending colon.  A polypectomy was performed with a cold snare.  The resection was complete and the polyp tissue was completely retrieved.   A sessile polyp measuring 6 mm in size was found in the transverse colon.  A polypectomy was performed using snare cautery.  The resection was complete and the polyp tissue was completely retrieved.   Two sessile polyps measuring 5-6 mm in size were found in the descending colon.  Polypectomy was performed using hot snare.  All resections were complete and all polyp tissue was completely retrieved.   Ten sessile polyps ranging between 3-55mm in size were found in the sigmoid colon and rectum.   Polypectomy was performed using cold snare and using hot snare.  All resections were complete and all polyp tissue was completely retrieved.  Retroflexed views revealed internal hemorrhoids. The time to cecum=4 minutes 30 seconds.  Withdrawal time=42 minutes 18 seconds.  The scope was withdrawn and the procedure completed.  COMPLICATIONS: There were no complications.  ENDOSCOPIC IMPRESSION: 1.   The colonic mucosa appeared normal at the appendiceal orifice (site of prior polypectomy); multiple biopsies of the area were performed 2.   Sessile polyp measuring 5 mm in size was found in the ascending colon; polypectomy was performed with a cold snare 3.   Sessile polyp measuring 6 mm in size was found in the transverse colon; polypectomy was performed using snare cautery 4.   Two sessile polyps measuring 5-6 mm in size were found in the descending colon; Polypectomy was performed using hot snare 5.   Ten sessile polyps ranging between 3-9mm in size were found in the sigmoid colon and rectum; Polypectomy was performed using cold snare and using hot snare  RECOMMENDATIONS: 1.  Hold aspirin, aspirin products, and anti-inflammatory medication for 2 weeks. 2.  Await pathology results 3.  Await results of genetics testing 4.  Repeat Colonoscopy in 1 year in 1 HOUR SLOT.   eSigned:  Beverley Fiedler, MD 10/12/2012 2:39 PM   cc: Ronna Polio MD and The Patient   PATIENT NAME:  Vasil, Juhasz MR#: [id number]

## 2012-10-13 ENCOUNTER — Telehealth: Payer: Self-pay

## 2012-10-13 NOTE — Telephone Encounter (Signed)
Left a message on the pt's answering machine at [phone] to call if any questions or concerns. Maw

## 2012-10-19 ENCOUNTER — Encounter: Payer: Self-pay | Admitting: Internal Medicine

## 2012-12-08 ENCOUNTER — Telehealth: Payer: Self-pay | Admitting: Genetic Counselor

## 2012-12-08 NOTE — Telephone Encounter (Signed)
Left good news message about genetic test results and asked to call me back.

## 2012-12-17 ENCOUNTER — Telehealth: Payer: Self-pay | Admitting: Genetic Counselor

## 2012-12-17 NOTE — Telephone Encounter (Signed)
Left good news message and asked that he CB

## 2013-01-01 ENCOUNTER — Encounter: Payer: Self-pay | Admitting: Genetic Counselor

## 2013-06-01 ENCOUNTER — Ambulatory Visit (INDEPENDENT_AMBULATORY_CARE_PROVIDER_SITE_OTHER): Payer: Managed Care, Other (non HMO) | Admitting: Adult Health

## 2013-06-01 ENCOUNTER — Encounter: Payer: Self-pay | Admitting: Adult Health

## 2013-06-01 VITALS — BP 108/60 | HR 76 | Temp 98.0°F | Resp 12 | Wt 154.5 lb

## 2013-06-01 DIAGNOSIS — R3129 Other microscopic hematuria: Secondary | ICD-10-CM | POA: Insufficient documentation

## 2013-06-01 LAB — POCT URINALYSIS DIPSTICK
Blood, UA: NEGATIVE
Glucose, UA: NEGATIVE
Leukocytes, UA: NEGATIVE
Nitrite, UA: NEGATIVE
Urobilinogen, UA: 0.2

## 2013-06-01 NOTE — Progress Notes (Signed)
  Subjective:    Patient ID: Paul Fields, male    DOB: January 24, 1961, 53 y.o.   MRN: [id number]  HPI  Patient reasons to clinic with report that he had microscopic hematuria on DOT physical. He was asked to follow up with his PCP. He denies any urinary symptoms of dysuria, flank pain, hematuria, fever or chills.   Review of Systems  Constitutional: Negative for fever and chills.  Genitourinary: Negative for dysuria, urgency, frequency, hematuria and flank pain.    BP 108/60  Pulse 76  Temp(Src) 98 F (36.7 C) (Oral)  Resp 12  Wt 154 lb 8 oz (70.081 kg)  BMI 23.15 kg/m2  SpO2 97%    Objective:   Physical Exam  Constitutional: He is oriented to person, place, and time. He appears well-developed and well-nourished.  Cardiovascular: Normal rate, regular rhythm and normal heart sounds.  Exam reveals no gallop.   No murmur heard. Pulmonary/Chest: Effort normal and breath sounds normal. No respiratory distress. He has no wheezes. He has no rales.  Abdominal: Soft. Bowel sounds are normal.  Neurological: He is alert and oriented to person, place, and time.  Skin: Skin is warm and dry.  Psychiatric: He has a normal mood and affect. His behavior is normal. Judgment and thought content normal.          Assessment & Plan:

## 2013-06-01 NOTE — Patient Instructions (Addendum)
  Please remember to schedule your yearly physical with Dr. walker.

## 2013-06-01 NOTE — Assessment & Plan Note (Signed)
Repeat exam is negative for microscopic hematuria.

## 2013-06-14 ENCOUNTER — Telehealth: Payer: Self-pay | Admitting: *Deleted

## 2013-06-14 NOTE — Telephone Encounter (Signed)
Patient left a message asking if you would prescribe him some Chantix?

## 2013-06-14 NOTE — Telephone Encounter (Signed)
OK. Fine to call in Chantix starter pack. Pt will need to set up a follow up visit within the next month. Please make sure he realizes that Chantix can lead to increased anxiety, vivid dreams.

## 2013-06-14 NOTE — Telephone Encounter (Signed)
Spoke with patient and he has taken it before, stated it was a long time ago. It worked for him, he just stopped using it.

## 2013-06-14 NOTE — Telephone Encounter (Signed)
Has he taken this before?

## 2013-06-15 MED ORDER — VARENICLINE TARTRATE 0.5 MG X 11 & 1 MG X 42 PO MISC: ORAL | Status: DC

## 2013-06-15 NOTE — Telephone Encounter (Signed)
Patient informed and verbalized understanding. Rx sent to pharmacy and patient will call back to schedule appointment.

## 2013-07-21 ENCOUNTER — Encounter: Payer: Self-pay | Admitting: Internal Medicine

## 2013-09-10 ENCOUNTER — Ambulatory Visit (INDEPENDENT_AMBULATORY_CARE_PROVIDER_SITE_OTHER): Payer: Managed Care, Other (non HMO) | Admitting: Internal Medicine

## 2013-09-10 ENCOUNTER — Encounter: Payer: Self-pay | Admitting: Internal Medicine

## 2013-09-10 VITALS — BP 136/82 | HR 68 | Temp 98.1°F | Resp 12 | Ht 69.5 in | Wt 159.5 lb

## 2013-09-10 DIAGNOSIS — Z0000 Encounter for general adult medical examination without abnormal findings: Secondary | ICD-10-CM

## 2013-09-10 DIAGNOSIS — Z23 Encounter for immunization: Secondary | ICD-10-CM

## 2013-09-10 DIAGNOSIS — Z86010 Personal history of colonic polyps: Secondary | ICD-10-CM

## 2013-09-10 LAB — LIPID PANEL
Cholesterol: 170 mg/dL (ref 0–200)
LDL Cholesterol: 107 mg/dL — ABNORMAL HIGH (ref 0–99)
Triglycerides: 44 mg/dL (ref 0.0–149.0)
VLDL: 8.8 mg/dL (ref 0.0–40.0)

## 2013-09-10 LAB — CBC WITH DIFFERENTIAL/PLATELET
Basophils Absolute: 0 10*3/uL (ref 0.0–0.1)
Basophils Relative: 0.7 % (ref 0.0–3.0)
Eosinophils Absolute: 0.1 10*3/uL (ref 0.0–0.7)
Eosinophils Relative: 2.6 % (ref 0.0–5.0)
Hemoglobin: 16.2 g/dL (ref 13.0–17.0)
Lymphocytes Relative: 31.8 % (ref 12.0–46.0)
Lymphs Abs: 1.8 10*3/uL (ref 0.7–4.0)
MCHC: 34.2 g/dL (ref 30.0–36.0)
MCV: 97.6 fl (ref 78.0–100.0)
Monocytes Absolute: 0.4 10*3/uL (ref 0.1–1.0)
Neutro Abs: 3.3 10*3/uL (ref 1.4–7.7)
Neutrophils Relative %: 58.3 % (ref 43.0–77.0)
Platelets: 223 10*3/uL (ref 150.0–400.0)
RBC: 4.85 Mil/uL (ref 4.22–5.81)
RDW: 12.9 % (ref 11.5–14.6)
WBC: 5.7 10*3/uL (ref 4.5–10.5)

## 2013-09-10 LAB — COMPREHENSIVE METABOLIC PANEL
Alkaline Phosphatase: 45 U/L (ref 39–117)
Glucose, Bld: 86 mg/dL (ref 70–99)
Sodium: 136 mEq/L (ref 135–145)
Total Bilirubin: 0.6 mg/dL (ref 0.3–1.2)
Total Protein: 6.8 g/dL (ref 6.0–8.3)

## 2013-09-10 MED ORDER — VARENICLINE TARTRATE 0.5 MG X 11 & 1 MG X 42 PO MISC: ORAL | Status: DC

## 2013-09-10 NOTE — Progress Notes (Signed)
Pre visit review using our clinic review tool, if applicable. No additional management support is needed unless otherwise documented below in the visit note. 

## 2013-09-10 NOTE — Assessment & Plan Note (Signed)
General medical exam normal today. Encouraged smoking cessation. Will restart Chantix. Will check labs today including CBC, CMP, lipid profile, PSA, TSH. We discussed potential limitations of PSA testing. Patient was given contact information to reschedule his followup colonoscopy. Flu vaccine given today.

## 2013-09-10 NOTE — Patient Instructions (Signed)
Smoking Cessation Quitting smoking is important to your health and has many advantages. However, it is not always easy to quit since nicotine is a very addictive drug. Often times, people try 3 times or more before being able to quit. This document explains the best ways for you to prepare to quit smoking. Quitting takes hard work and a lot of effort, but you can do it. ADVANTAGES OF QUITTING SMOKING  You will live longer, feel better, and live better.  Your body will feel the impact of quitting smoking almost immediately.  Within 20 minutes, blood pressure decreases. Your pulse returns to its normal level.  After 8 hours, carbon monoxide levels in the blood return to normal. Your oxygen level increases.  After 24 hours, the chance of having a heart attack starts to decrease. Your breath, hair, and body stop smelling like smoke.  After 48 hours, damaged nerve endings begin to recover. Your sense of taste and smell improve.  After 72 hours, the body is virtually free of nicotine. Your bronchial tubes relax and breathing becomes easier.  After 2 to 12 weeks, lungs can hold more air. Exercise becomes easier and circulation improves.  The risk of having a heart attack, stroke, cancer, or lung disease is greatly reduced.  After 1 year, the risk of coronary heart disease is cut in half.  After 5 years, the risk of stroke falls to the same as a nonsmoker.  After 10 years, the risk of lung cancer is cut in half and the risk of other cancers decreases significantly.  After 15 years, the risk of coronary heart disease drops, usually to the level of a nonsmoker.  If you are pregnant, quitting smoking will improve your chances of having a healthy baby.  The people you live with, especially any children, will be healthier.  You will have extra money to spend on things other than cigarettes. QUESTIONS TO THINK ABOUT BEFORE ATTEMPTING TO QUIT You may want to talk about your answers with your  caregiver.  Why do you want to quit?  If you tried to quit in the past, what helped and what did not?  What will be the most difficult situations for you after you quit? How will you plan to handle them?  Who can help you through the tough times? Your family? Friends? A caregiver?  What pleasures do you get from smoking? What ways can you still get pleasure if you quit? Here are some questions to ask your caregiver:  How can you help me to be successful at quitting?  What medicine do you think would be best for me and how should I take it?  What should I do if I need more help?  What is smoking withdrawal like? How can I get information on withdrawal? GET READY  Set a quit date.  Change your environment by getting rid of all cigarettes, ashtrays, matches, and lighters in your home, car, or work. Do not let people smoke in your home.  Review your past attempts to quit. Think about what worked and what did not. GET SUPPORT AND ENCOURAGEMENT You have a better chance of being successful if you have help. You can get support in many ways.  Tell your family, friends, and co-workers that you are going to quit and need their support. Ask them not to smoke around you.  Get individual, group, or telephone counseling and support. Programs are available at local hospitals and health centers. Call your local health department for   information about programs in your area.  Spiritual beliefs and practices may help some smokers quit.  Download a "quit meter" on your computer to keep track of quit statistics, such as how long you have gone without smoking, cigarettes not smoked, and money saved.  Get a self-help book about quitting smoking and staying off of tobacco. LEARN NEW SKILLS AND BEHAVIORS  Distract yourself from urges to smoke. Talk to someone, go for a walk, or occupy your time with a task.  Change your normal routine. Take a different route to work. Drink tea instead of coffee.  Eat breakfast in a different place.  Reduce your stress. Take a hot bath, exercise, or read a book.  Plan something enjoyable to do every day. Reward yourself for not smoking.  Explore interactive web-based programs that specialize in helping you quit. GET MEDICINE AND USE IT CORRECTLY Medicines can help you stop smoking and decrease the urge to smoke. Combining medicine with the above behavioral methods and support can greatly increase your chances of successfully quitting smoking.  Nicotine replacement therapy helps deliver nicotine to your body without the negative effects and risks of smoking. Nicotine replacement therapy includes nicotine gum, lozenges, inhalers, nasal sprays, and skin patches. Some may be available over-the-counter and others require a prescription.  Antidepressant medicine helps people abstain from smoking, but how this works is unknown. This medicine is available by prescription.  Nicotinic receptor partial agonist medicine simulates the effect of nicotine in your brain. This medicine is available by prescription. Ask your caregiver for advice about which medicines to use and how to use them based on your health history. Your caregiver will tell you what side effects to look out for if you choose to be on a medicine or therapy. Carefully read the information on the package. Do not use any other product containing nicotine while using a nicotine replacement product.  RELAPSE OR DIFFICULT SITUATIONS Most relapses occur within the first 3 months after quitting. Do not be discouraged if you start smoking again. Remember, most people try several times before finally quitting. You may have symptoms of withdrawal because your body is used to nicotine. You may crave cigarettes, be irritable, feel very hungry, cough often, get headaches, or have difficulty concentrating. The withdrawal symptoms are only temporary. They are strongest when you first quit, but they will go away within  10 14 days. To reduce the chances of relapse, try to:  Avoid drinking alcohol. Drinking lowers your chances of successfully quitting.  Reduce the amount of caffeine you consume. Once you quit smoking, the amount of caffeine in your body increases and can give you symptoms, such as a rapid heartbeat, sweating, and anxiety.  Avoid smokers because they can make you want to smoke.  Do not let weight gain distract you. Many smokers will gain weight when they quit, usually less than 10 pounds. Eat a healthy diet and stay active. You can always lose the weight gained after you quit.  Find ways to improve your mood other than smoking. FOR MORE INFORMATION  www.smokefree.gov  Document Released: 10/08/2001 Document Revised: 04/14/2012 Document Reviewed: 01/23/2012 [employer] Patient Information 2014 [employer].  

## 2013-09-10 NOTE — Progress Notes (Signed)
  Subjective:    Patient ID: Paul Fields, male    DOB: 07-16-1961, 52 y.o.   MRN: [id number]  HPI 52 year old male presents for annual exam. He reports he has been feeling well. He denies any concerns today. He would like to quit smoking and request a refill on Chantix which she would like to start again. In the past, cost is limited his use of this medication. He recently underwent colonoscopy in December 2013. He was found to have numerous polyps in genetic testing was suggestive of Lynch syndrome. He is due for repeat colonoscopy. He denies any change in bowel habits. He does occasionally have bright red blood on the toilet paper when straining to have a bowel movement. He reports a good appetite and normal energy level. He requests flu vaccine today.  Outpatient Encounter Prescriptions as of 09/10/2013  Medication Sig  . varenicline (CHANTIX STARTING MONTH PAK) 0.5 MG X 11 & 1 MG X 42 tablet Take one 0.5 mg tablet by mouth once daily for 3 days, then increase to one 0.5 mg tablet twice daily for 4 days, then increase to one 1 mg tablet twice daily.   BP 136/82  Pulse 68  Temp(Src) 98.1 F (36.7 C) (Oral)  Resp 12  Ht 5' 9.5" (1.765 m)  Wt 159 lb 8 oz (72.349 kg)  BMI 23.22 kg/m2  SpO2 98%  Review of Systems  Constitutional: Negative for fever, chills, activity change, appetite change, fatigue and unexpected weight change.  Eyes: Negative for visual disturbance.  Respiratory: Negative for cough and shortness of breath.   Cardiovascular: Negative for chest pain, palpitations and leg swelling.  Gastrointestinal: Negative for abdominal pain and abdominal distention.  Genitourinary: Negative for dysuria, urgency and difficulty urinating.  Musculoskeletal: Negative for arthralgias and gait problem.  Skin: Negative for color change and rash.  Hematological: Negative for adenopathy.  Psychiatric/Behavioral: Negative for sleep disturbance and dysphoric mood. The patient is not  nervous/anxious.        Objective:   Physical Exam  Constitutional: He is oriented to person, place, and time. He appears well-developed and well-nourished. No distress.  HENT:  Head: Normocephalic and atraumatic.  Right Ear: External ear normal.  Left Ear: External ear normal.  Nose: Nose normal.  Mouth/Throat: Oropharynx is clear and moist. No oropharyngeal exudate.  Eyes: Conjunctivae and EOM are normal. Pupils are equal, round, and reactive to light. Right eye exhibits no discharge. Left eye exhibits no discharge. No scleral icterus.  Neck: Normal range of motion. Neck supple. No tracheal deviation present. No thyromegaly present.  Cardiovascular: Normal rate, regular rhythm and normal heart sounds.  Exam reveals no gallop and no friction rub.   No murmur heard. Pulmonary/Chest: Effort normal and breath sounds normal. No respiratory distress. He has no wheezes. He has no rales. He exhibits no tenderness.  Abdominal: Soft. Bowel sounds are normal. He exhibits no distension and no mass. There is no tenderness. There is no rebound and no guarding.  Musculoskeletal: Normal range of motion. He exhibits no edema.  Lymphadenopathy:    He has no cervical adenopathy.  Neurological: He is alert and oriented to person, place, and time. No cranial nerve deficit. Coordination normal.  Skin: Skin is warm and dry. No rash noted. He is not diaphoretic. No erythema. No pallor.  Psychiatric: He has a normal mood and affect. His behavior is normal. Judgment and thought content normal.          Assessment & Plan:

## 2013-09-11 LAB — VITAMIN D 25 HYDROXY (VIT D DEFICIENCY, FRACTURES): Vit D, 25-Hydroxy: 35 ng/mL (ref 30–89)

## 2014-03-26 ENCOUNTER — Encounter: Payer: Self-pay | Admitting: Internal Medicine

## 2014-05-12 ENCOUNTER — Encounter: Payer: Self-pay | Admitting: Internal Medicine

## 2014-05-16 ENCOUNTER — Ambulatory Visit: Payer: BC Managed Care – PPO | Admitting: Psychology

## 2014-05-18 ENCOUNTER — Ambulatory Visit (INDEPENDENT_AMBULATORY_CARE_PROVIDER_SITE_OTHER): Payer: BC Managed Care – PPO | Admitting: Psychology

## 2014-05-18 DIAGNOSIS — F4323 Adjustment disorder with mixed anxiety and depressed mood: Secondary | ICD-10-CM

## 2014-05-20 ENCOUNTER — Telehealth: Payer: Self-pay | Admitting: *Deleted

## 2014-05-20 NOTE — Telephone Encounter (Signed)
Pt's wife, Lattie Haw, called stating that pt needs refill on Chantix, Okay to refill?

## 2014-05-20 NOTE — Telephone Encounter (Signed)
Fine to refill 

## 2014-05-23 ENCOUNTER — Other Ambulatory Visit: Payer: Self-pay | Admitting: *Deleted

## 2014-05-24 NOTE — Telephone Encounter (Signed)
Rx sent yesterday

## 2014-06-16 MED ORDER — VARENICLINE TARTRATE 0.5 MG X 11 & 1 MG X 42 PO MISC: ORAL | Status: DC

## 2014-06-17 ENCOUNTER — Telehealth: Payer: Self-pay | Admitting: Internal Medicine

## 2014-06-17 NOTE — Telephone Encounter (Signed)
Given to Dr. Gilford Rile for signature

## 2014-06-17 NOTE — Telephone Encounter (Signed)
Called wife's phone, left message that form was faxed and if she could call back with contact # for patient

## 2014-06-17 NOTE — Telephone Encounter (Signed)
Form faxed. Attempted to notify pt it was done, number "temporarily disconnected".

## 2014-06-17 NOTE — Telephone Encounter (Signed)
Pt came in and dropped off a biometric screening packet that he would like Dr. Gilford Rile to fill out and fax in. Placed in Dr. Derry Skill box.

## 2014-06-22 ENCOUNTER — Ambulatory Visit (INDEPENDENT_AMBULATORY_CARE_PROVIDER_SITE_OTHER): Payer: BC Managed Care – PPO | Admitting: Adult Health

## 2014-06-22 ENCOUNTER — Encounter: Payer: Managed Care, Other (non HMO) | Admitting: Internal Medicine

## 2014-06-22 ENCOUNTER — Encounter: Payer: Self-pay | Admitting: Adult Health

## 2014-06-22 VITALS — BP 136/85 | HR 77 | Temp 97.9°F | Resp 14 | Wt 158.0 lb

## 2014-06-22 DIAGNOSIS — R220 Localized swelling, mass and lump, head: Secondary | ICD-10-CM

## 2014-06-22 DIAGNOSIS — R221 Localized swelling, mass and lump, neck: Secondary | ICD-10-CM

## 2014-06-22 NOTE — Progress Notes (Signed)
Patient ID: Paul Fields, male   DOB: 1961-02-22, 53 y.o.   MRN: [id number]   Subjective:    Patient ID: Paul Fields, male    DOB: 18-Nov-1960, 53 y.o.   MRN: [id number]  HPI  Pt is a 53 y/o smoker who presents to clinic with concerns of a painful mass on the left side of his neck. He denies dizziness, lightheadedness. He reports it is more painful with activity. Pulsatile. Reported first noticing it on Sunday. Denies shortness of breath, malaise, fever/chills. He denies feeling sick or having any known sick contacts. Denies sore throat, tooth or mouth pain. No unexpected weight loss. No trouble swallowing. No hoarseness. Presents to clinic with his wife.   Past Medical History  Diagnosis Date  . Elbow fracture, right 1968  . Clavicle fracture 1986  . COPD (chronic obstructive pulmonary disease)   . Diverticulosis   . Hemorrhoid   . Lynch syndrome     possible based on genetic testing    Current Outpatient Prescriptions on File Prior to Visit  Medication Sig Dispense Refill  . varenicline (CHANTIX STARTING MONTH PAK) 0.5 MG X 11 & 1 MG X 42 tablet Take one 0.5 mg tablet by mouth once daily for 3 days, then increase to one 0.5 mg tablet twice daily for 4 days, then increase to one 1 mg tablet twice daily.  53 tablet  0   No current facility-administered medications on file prior to visit.     Review of Systems  Constitutional: Negative for fever and chills.  HENT: Negative for congestion, dental problem, ear pain, facial swelling, mouth sores, sore throat, trouble swallowing and voice change.        Left neck mass. Painful  Respiratory: Negative.  Negative for choking and shortness of breath.   Cardiovascular: Negative.   Musculoskeletal: Negative.   Neurological: Positive for headaches (no headache at present time). Negative for dizziness, syncope and light-headedness.  Psychiatric/Behavioral: Negative.        Objective:  BP 136/85  Pulse 77  Temp(Src) 97.9 F (36.6  C) (Oral)  Resp 14  Wt 158 lb (71.668 kg)  SpO2 97%   Physical Exam  Constitutional: He is oriented to person, place, and time. He appears well-developed and well-nourished. No distress.  Appears concerned  HENT:  Head: Normocephalic and atraumatic.  Mouth/Throat: Oropharynx is clear and moist.  Eyes: Conjunctivae and EOM are normal.  Neck:  Palpable mass on left side of neck.  Cardiovascular: Normal rate and regular rhythm.   No carotid bruits appreciated  Pulmonary/Chest: Effort normal. No respiratory distress.  Musculoskeletal: Normal range of motion.  Lymphadenopathy:    He has no cervical adenopathy.  Neurological: He is alert and oriented to person, place, and time. Coordination normal.  Skin: Skin is warm and dry.  Psychiatric: He has a normal mood and affect. His behavior is normal. Judgment and thought content normal.      Assessment & Plan:   1. Neck mass Referring to ENT for evaluation; possible bx. Hx of smoking. Infectious etiology considered; however, pt does not present with any symptoms of illness. Concerns that mass may be malignant given long hx of smoking. Send for CT neck.  - CT Soft Tissue Neck W Contrast; Future - Ambulatory referral to ENT

## 2014-06-22 NOTE — Progress Notes (Signed)
Pre visit review using our clinic review tool, if applicable. No additional management support is needed unless otherwise documented below in the visit note. 

## 2014-06-24 ENCOUNTER — Ambulatory Visit: Payer: Self-pay | Admitting: Adult Health

## 2014-06-28 ENCOUNTER — Telehealth: Payer: Self-pay | Admitting: Adult Health

## 2014-06-28 NOTE — Telephone Encounter (Signed)
CT shows no mass in the neck. If you are still having pain, please schedule appt with your doctor.

## 2014-06-28 NOTE — Telephone Encounter (Signed)
Called and advised patient of results, verbalized understanding. States pain has improved

## 2014-06-29 ENCOUNTER — Ambulatory Visit: Payer: BC Managed Care – PPO

## 2014-07-06 ENCOUNTER — Encounter: Payer: Self-pay | Admitting: Adult Health

## 2015-01-30 ENCOUNTER — Encounter: Payer: Self-pay | Admitting: Internal Medicine

## 2015-03-08 LAB — HM COLONOSCOPY

## 2015-03-24 ENCOUNTER — Ambulatory Visit (AMBULATORY_SURGERY_CENTER): Payer: Self-pay | Admitting: *Deleted

## 2015-03-24 VITALS — Ht 69.0 in | Wt 162.8 lb

## 2015-03-24 DIAGNOSIS — Z86010 Personal history of colonic polyps: Secondary | ICD-10-CM

## 2015-03-24 MED ORDER — NA SULFATE-K SULFATE-MG SULF 17.5-3.13-1.6 GM/177ML PO SOLN: ORAL | Status: DC

## 2015-03-24 NOTE — Progress Notes (Signed)
No allergies to eggs or soy. No problems with anesthesia.  Pt given Emmi instructions for colonoscopy  No oxygen use  No diet drug use  

## 2015-04-04 ENCOUNTER — Encounter: Payer: Self-pay | Admitting: Internal Medicine

## 2015-04-07 ENCOUNTER — Encounter: Payer: Self-pay | Admitting: Internal Medicine

## 2015-04-17 ENCOUNTER — Ambulatory Visit (AMBULATORY_SURGERY_CENTER): Payer: BLUE CROSS/BLUE SHIELD | Admitting: Internal Medicine

## 2015-04-17 ENCOUNTER — Encounter: Payer: Self-pay | Admitting: Internal Medicine

## 2015-04-17 VITALS — BP 113/67 | HR 72 | Temp 97.1°F | Resp 22 | Ht 69.0 in | Wt 162.0 lb

## 2015-04-17 DIAGNOSIS — D123 Benign neoplasm of transverse colon: Secondary | ICD-10-CM

## 2015-04-17 DIAGNOSIS — Z86010 Personal history of colonic polyps: Secondary | ICD-10-CM | POA: Diagnosis not present

## 2015-04-17 DIAGNOSIS — D125 Benign neoplasm of sigmoid colon: Secondary | ICD-10-CM | POA: Diagnosis not present

## 2015-04-17 MED ORDER — SODIUM CHLORIDE 0.9 % IV SOLN: 500.0000 mL | INTRAVENOUS | Status: DC

## 2015-04-17 NOTE — Patient Instructions (Signed)
Discharge instructions given. Handout polyp. Resume previous medications. YOU HAD AN ENDOSCOPIC PROCEDURE TODAY AT [facility]:   Refer to the procedure report that was given to you for any specific questions about what was found during the examination.  If the procedure report does not answer your questions, please call your gastroenterologist to clarify.  If you requested that your care partner not be given the details of your procedure findings, then the procedure report has been included in a sealed envelope for you to review at your convenience later.  YOU SHOULD EXPECT: Some feelings of bloating in the abdomen. Passage of more gas than usual.  Walking can help get rid of the air that was put into your GI tract during the procedure and reduce the bloating. If you had a lower endoscopy (such as a colonoscopy or flexible sigmoidoscopy) you may notice spotting of blood in your stool or on the toilet paper. If you underwent a bowel prep for your procedure, you may not have a normal bowel movement for a few days.  Please Note:  You might notice some irritation and congestion in your nose or some drainage.  This is from the oxygen used during your procedure.  There is no need for concern and it should clear up in a day or so.  SYMPTOMS TO REPORT IMMEDIATELY:   Following lower endoscopy (colonoscopy or flexible sigmoidoscopy):  Excessive amounts of blood in the stool  Significant tenderness or worsening of abdominal pains  Swelling of the abdomen that is new, acute  Fever of 100F or higher   For urgent or emergent issues, a gastroenterologist can be reached at any hour by calling [phone].   DIET: Your first meal following the procedure should be a small meal and then it is ok to progress to your normal diet. Heavy or fried foods are harder to digest and may make you feel nauseous or bloated.  Likewise, meals heavy in dairy and vegetables can increase bloating.  Drink  plenty of fluids but you should avoid alcoholic beverages for 24 hours.  ACTIVITY:  You should plan to take it easy for the rest of today and you should NOT DRIVE or use heavy machinery until tomorrow (because of the sedation medicines used during the test).    FOLLOW UP: Our staff will call the number listed on your records the next business day following your procedure to check on you and address any questions or concerns that you may have regarding the information given to you following your procedure. If we do not reach you, we will leave a message.  However, if you are feeling well and you are not experiencing any problems, there is no need to return our call.  We will assume that you have returned to your regular daily activities without incident.  If any biopsies were taken you will be contacted by phone or by letter within the next 1-3 weeks.  Please call us at [phone] if you have not heard about the biopsies in 3 weeks.    SIGNATURES/CONFIDENTIALITY: You and/or your care partner have signed paperwork which will be entered into your electronic medical record.  These signatures attest to the fact that that the information above on your After Visit Summary has been reviewed and is understood.  Full responsibility of the confidentiality of this discharge information lies with you and/or your care-partner.

## 2015-04-17 NOTE — Progress Notes (Signed)
Report to PACU, RN, vss, BBS= Clear.  

## 2015-04-17 NOTE — Progress Notes (Signed)
Called to room to assist during endoscopic procedure.  Patient ID and intended procedure confirmed with present staff. Received instructions for my participation in the procedure from the performing physician.  

## 2015-04-17 NOTE — Op Note (Signed)
[place]  [address] [place], [id number]   COLONOSCOPY PROCEDURE REPORT  PATIENT: Paul Fields, Paul Fields  MR#: [id number] BIRTHDATE: February 08, 1961 , 15  yrs. old GENDER: male ENDOSCOPIST: Jerene Bears, MD PROCEDURE DATE:  04/17/2015 PROCEDURE:   Colonoscopy, surveillance and Colonoscopy with snare polypectomy First Screening Colonoscopy - Avg.  risk and is 50 yrs.  old or older - No.  Prior Negative Screening - Now for repeat screening. N/A  History of Adenoma - Now for follow-up colonoscopy & has been > or = to 3 yrs.  Yes hx of adenoma.  Has been 3 or more years since last colonoscopy.  Polyps removed today? Yes ASA CLASS:   Class II INDICATIONS:Surveillance due to prior colonic neoplasia, history of multiple adenomatous colon polyps (>20), last colon Dec 2013 MEDICATIONS: Monitored anesthesia care and Propofol 270 mg IV  DESCRIPTION OF PROCEDURE:   After the risks benefits and alternatives of the procedure were thoroughly explained, informed consent was obtained.  The digital rectal exam revealed no rectal mass.   The LB [id number]  endoscope was introduced through the anus and advanced to the cecum, which was identified by both the appendix and ileocecal valve. No adverse events experienced. The quality of the prep was good.  (Suprep was used)  The instrument was then slowly withdrawn as the colon was fully examined. Estimated blood loss is zero unless otherwise noted in this procedure report.   COLON FINDINGS: Five sessile polyps ranging between 3-43mm in size were found in the transverse colon (1) and sigmoid colon (4). Polypectomies were performed with a cold snare.  The resection was complete, the polyp tissue was completely retrieved and sent to histology.   The examination was otherwise normal.  Retroflexed views revealed internal hemorrhoids. The time to cecum = 1.5 Withdrawal time = 21.1   The scope was withdrawn and the  procedure completed.  COMPLICATIONS: There were no immediate complications.  ENDOSCOPIC IMPRESSION: 1.   Five sessile polyps ranging between 3-72mm in size were found in the transverse colon and sigmoid colon; polypectomies were performed with a cold snare 2.   The examination was otherwise normal  RECOMMENDATIONS: 1.  Await pathology results 2.  Repeat Colonoscopy in 3 years. 3.  You will receive a letter within 1-2 weeks with the results of your biopsy as well as final recommendations.  Please call my office if you have not received a letter after 3 weeks.  eSigned:  Jerene Bears, MD 04/17/2015 3:39 PM   cc: The Patient and Ronette Deter MD   PATIENT NAME:  [name], [name] MR#: [id number]

## 2015-04-18 ENCOUNTER — Telehealth: Payer: Self-pay | Admitting: Emergency Medicine

## 2015-04-18 NOTE — Telephone Encounter (Signed)
Left message to f/u, no identifier

## 2015-04-27 ENCOUNTER — Encounter: Payer: Self-pay | Admitting: Internal Medicine

## 2015-04-28 HISTORY — PX: COLONOSCOPY W/ BIOPSIES: SHX1374

## 2016-03-07 ENCOUNTER — Ambulatory Visit (INDEPENDENT_AMBULATORY_CARE_PROVIDER_SITE_OTHER): Payer: BLUE CROSS/BLUE SHIELD | Admitting: Internal Medicine

## 2016-03-07 ENCOUNTER — Encounter: Payer: Self-pay | Admitting: Internal Medicine

## 2016-03-07 VITALS — BP 142/82 | HR 79 | Temp 97.9°F | Ht 69.0 in | Wt 162.4 lb

## 2016-03-07 DIAGNOSIS — F419 Anxiety disorder, unspecified: Secondary | ICD-10-CM

## 2016-03-07 DIAGNOSIS — Z720 Tobacco use: Secondary | ICD-10-CM | POA: Diagnosis not present

## 2016-03-07 DIAGNOSIS — Z0000 Encounter for general adult medical examination without abnormal findings: Secondary | ICD-10-CM | POA: Diagnosis not present

## 2016-03-07 DIAGNOSIS — F332 Major depressive disorder, recurrent severe without psychotic features: Secondary | ICD-10-CM

## 2016-03-07 DIAGNOSIS — F339 Major depressive disorder, recurrent, unspecified: Secondary | ICD-10-CM | POA: Insufficient documentation

## 2016-03-07 DIAGNOSIS — Z0001 Encounter for general adult medical examination with abnormal findings: Secondary | ICD-10-CM | POA: Diagnosis not present

## 2016-03-07 HISTORY — DX: Major depressive disorder, recurrent, unspecified: F33.9

## 2016-03-07 LAB — COMPREHENSIVE METABOLIC PANEL
ALBUMIN: 4.5 g/dL (ref 3.5–5.2)
ALT: 14 U/L (ref 0–53)
AST: 12 U/L (ref 0–37)
Alkaline Phosphatase: 59 U/L (ref 39–117)
BUN: 13 mg/dL (ref 6–23)
CALCIUM: 9.4 mg/dL (ref 8.4–10.5)
CHLORIDE: 102 meq/L (ref 96–112)
CO2: 26 meq/L (ref 19–32)
Creatinine, Ser: 1.08 mg/dL (ref 0.40–1.50)
GFR: 74.98 mL/min (ref >60.00)
Glucose, Bld: 92 mg/dL (ref 70–99)
POTASSIUM: 4.5 meq/L (ref 3.5–5.1)
Sodium: 138 mEq/L (ref 135–145)
Total Bilirubin: 0.5 mg/dL (ref 0.2–1.2)
Total Protein: 6.5 g/dL (ref 6.0–8.3)

## 2016-03-07 LAB — CBC WITH DIFFERENTIAL/PLATELET
BASOS PCT: 0.6 % (ref 0.0–3.0)
Basophils Absolute: 0.1 10*3/uL (ref 0.0–0.1)
EOS ABS: 0.2 10*3/uL (ref 0.0–0.7)
EOS PCT: 2.5 % (ref 0.0–5.0)
HEMATOCRIT: 48 % (ref 39.0–52.0)
HEMOGLOBIN: 16.5 g/dL (ref 13.0–17.0)
LYMPHS PCT: 22.4 % (ref 12.0–46.0)
Lymphs Abs: 2 10*3/uL (ref 0.7–4.0)
MCHC: 34.3 g/dL (ref 30.0–36.0)
MCV: 95.7 fl (ref 78.0–100.0)
Monocytes Absolute: 0.6 10*3/uL (ref 0.1–1.0)
Monocytes Relative: 6.4 % (ref 3.0–12.0)
NEUTROS ABS: 6.2 10*3/uL (ref 1.4–7.7)
Neutrophils Relative %: 68.1 % (ref 43.0–77.0)
PLATELETS: 255 10*3/uL (ref 150.0–400.0)
RBC: 5.02 Mil/uL (ref 4.22–5.81)
RDW: 13.2 % (ref 11.5–15.5)
WBC: 9.1 10*3/uL (ref 4.0–10.5)

## 2016-03-07 LAB — LIPID PANEL
CHOL/HDL RATIO: 3
CHOLESTEROL: 161 mg/dL (ref 0–200)
HDL: 49.7 mg/dL (ref >39.00)
LDL CALC: 97 mg/dL (ref 0–99)
NonHDL: 111.67
TRIGLYCERIDES: 73 mg/dL (ref 0.0–149.0)
VLDL: 14.6 mg/dL (ref 0.0–40.0)

## 2016-03-07 LAB — TSH: TSH: 1.16 u[IU]/mL (ref 0.35–4.50)

## 2016-03-07 LAB — VITAMIN D 25 HYDROXY (VIT D DEFICIENCY, FRACTURES): VITD: 20.6 ng/mL — ABNORMAL LOW (ref 30.00–100.00)

## 2016-03-07 MED ORDER — BUPROPION HCL ER (XL) 150 MG PO TB24: 150.0000 mg | ORAL_TABLET | Freq: Every day | ORAL | Status: DC

## 2016-03-07 NOTE — Patient Instructions (Addendum)
Start Wellbutrin 150mg  in the morning.  Health Maintenance, Male A healthy lifestyle and preventative care can promote health and wellness.  Maintain regular health, dental, and eye exams.  Eat a healthy diet. Foods like vegetables, fruits, whole grains, low-fat dairy products, and lean protein foods contain the nutrients you need and are low in calories. Decrease your intake of foods high in solid fats, added sugars, and salt. Get information about a proper diet from your health care provider, if necessary.  Regular physical exercise is one of the most important things you can do for your health. Most adults should get at least 150 minutes of moderate-intensity exercise (any activity that increases your heart rate and causes you to sweat) each week. In addition, most adults need muscle-strengthening exercises on 2 or more days a week.   Maintain a healthy weight. The body mass index (BMI) is a screening tool to identify possible weight problems. It provides an estimate of body fat based on height and weight. Your health care provider can find your BMI and can help you achieve or maintain a healthy weight. For males 20 years and older:  A BMI below 18.5 is considered underweight.  A BMI of 18.5 to 24.9 is normal.  A BMI of 25 to 29.9 is considered overweight.  A BMI of 30 and above is considered obese.  Maintain normal blood lipids and cholesterol by exercising and minimizing your intake of saturated fat. Eat a balanced diet with plenty of fruits and vegetables. Blood tests for lipids and cholesterol should begin at age 66 and be repeated every 5 years. If your lipid or cholesterol levels are high, you are over age 43, or you are at high risk for heart disease, you may need your cholesterol levels checked more frequently.Ongoing high lipid and cholesterol levels should be treated with medicines if diet and exercise are not working.  If you smoke, find out from your health care provider how  to quit. If you do not use tobacco, do not start.  Lung cancer screening is recommended for adults aged 59-80 years who are at high risk for developing lung cancer because of a history of smoking. A yearly low-dose CT scan of the lungs is recommended for people who have at least a 30-pack-year history of smoking and are current smokers or have quit within the past 15 years. A pack year of smoking is smoking an average of 1 pack of cigarettes a day for 1 year (for example, a 30-pack-year history of smoking could mean smoking 1 pack a day for 30 years or 2 packs a day for 15 years). Yearly screening should continue until the smoker has stopped smoking for at least 15 years. Yearly screening should be stopped for people who develop a health problem that would prevent them from having lung cancer treatment.  If you choose to drink alcohol, do not have more than 2 drinks per day. One drink is considered to be 12 oz (360 mL) of beer, 5 oz (150 mL) of wine, or 1.5 oz (45 mL) of liquor.  Avoid the use of street drugs. Do not share needles with anyone. Ask for help if you need support or instructions about stopping the use of drugs.  High blood pressure causes heart disease and increases the risk of stroke. High blood pressure is more likely to develop in:  People who have blood pressure in the end of the normal range (100-139/85-89 mm Hg).  People who are overweight or obese.  People who are African American.  If you are 94-79 years of age, have your blood pressure checked every 3-5 years. If you are 52 years of age or older, have your blood pressure checked every year. You should have your blood pressure measured twice--once when you are at a hospital or clinic, and once when you are not at a hospital or clinic. Record the average of the two measurements. To check your blood pressure when you are not at a hospital or clinic, you can use:  An automated blood pressure machine at a pharmacy.  A home blood  pressure monitor.  If you are 31-94 years old, ask your health care provider if you should take aspirin to prevent heart disease.  Diabetes screening involves taking a blood sample to check your fasting blood sugar level. This should be done once every 3 years after age 96 if you are at a normal weight and without risk factors for diabetes. Testing should be considered at a younger age or be carried out more frequently if you are overweight and have at least 1 risk factor for diabetes.  Colorectal cancer can be detected and often prevented. Most routine colorectal cancer screening begins at the age of 17 and continues through age 73. However, your health care provider may recommend screening at an earlier age if you have risk factors for colon cancer. On a yearly basis, your health care provider may provide home test kits to check for hidden blood in the stool. A small camera at the end of a tube may be used to directly examine the colon (sigmoidoscopy or colonoscopy) to detect the earliest forms of colorectal cancer. Talk to your health care provider about this at age 40 when routine screening begins. A direct exam of the colon should be repeated every 5-10 years through age 73, unless early forms of precancerous polyps or small growths are found.  People who are at an increased risk for hepatitis B should be screened for this virus. You are considered at high risk for hepatitis B if:  You were born in a country where hepatitis B occurs often. Talk with your health care provider about which countries are considered high risk.  Your parents were born in a high-risk country and you have not received a shot to protect against hepatitis B (hepatitis B vaccine).  You have HIV or AIDS.  You use needles to inject street drugs.  You live with, or have sex with, someone who has hepatitis B.  You are a man who has sex with other men (MSM).  You get hemodialysis treatment.  You take certain medicines  for conditions like cancer, organ transplantation, and autoimmune conditions.  Hepatitis C blood testing is recommended for all people born from [birth year] through 1967 and any individual with known risk factors for hepatitis C.  Healthy men should no longer receive prostate-specific antigen (PSA) blood tests as part of routine cancer screening. Talk to your health care provider about prostate cancer screening.  Testicular cancer screening is not recommended for adolescents or adult males who have no symptoms. Screening includes self-exam, a health care provider exam, and other screening tests. Consult with your health care provider about any symptoms you have or any concerns you have about testicular cancer.  Practice safe sex. Use condoms and avoid high-risk sexual practices to reduce the spread of sexually transmitted infections (STIs).  You should be screened for STIs, including gonorrhea and chlamydia if:  You are sexually active and are  younger than 24 years.  You are older than 24 years, and your health care provider tells you that you are at risk for this type of infection.  Your sexual activity has changed since you were last screened, and you are at an increased risk for chlamydia or gonorrhea. Ask your health care provider if you are at risk.  If you are at risk of being infected with HIV, it is recommended that you take a prescription medicine daily to prevent HIV infection. This is called pre-exposure prophylaxis (PrEP). You are considered at risk if:  You are a man who has sex with other men (MSM).  You are a heterosexual man who is sexually active with multiple partners.  You take drugs by injection.  You are sexually active with a partner who has HIV.  Talk with your health care provider about whether you are at high risk of being infected with HIV. If you choose to begin PrEP, you should first be tested for HIV. You should then be tested every 3 months for as long as you are  taking PrEP.  Use sunscreen. Apply sunscreen liberally and repeatedly throughout the day. You should seek shade when your shadow is shorter than you. Protect yourself by wearing long sleeves, pants, a wide-brimmed hat, and sunglasses year round whenever you are outdoors.  Tell your health care provider of new moles or changes in moles, especially if there is a change in shape or color. Also, tell your health care provider if a mole is larger than the size of a pencil eraser.  A one-time screening for abdominal aortic aneurysm (AAA) and surgical repair of large AAAs by ultrasound is recommended for men aged 7-75 years who are current or former smokers.  Stay current with your vaccines (immunizations).   This information is not intended to replace advice given to you by your health care provider. Make sure you discuss any questions you have with your health care provider.   Document Released: 04/11/2008 Document Revised: 11/04/2014 Document Reviewed: 03/11/2011 Elsevier Interactive Patient Education [employer].

## 2016-03-07 NOTE — Assessment & Plan Note (Signed)
Symptoms of anxiety and depression. PHQ9 - 13. Will set up psychiatry evaluation. Will start Wellbutrin, given that other family members have responded very well to this. Discussed potential risks of this medication.He will call or email if any changes. No current suicidal ideation.

## 2016-03-07 NOTE — Assessment & Plan Note (Signed)
General medical exam normal today except as noted. Labs today. Prostate cancer screen with PSA. Discussed potential limits and benefits of PSA and DRE. Colonoscopy UTD and reviewed. Immunizations UTD. Will set up lung cancer screen with CT chest. Encouraged smoking cessation. Encouraged healthy diet and exercise.

## 2016-03-07 NOTE — Progress Notes (Signed)
Pre visit review using our clinic review tool, if applicable. No additional management support is needed unless otherwise documented below in the visit note. 

## 2016-03-07 NOTE — Assessment & Plan Note (Signed)
Worsening symptoms of anxiety and depression. Will set up psychiatry evaluation and start Wellbutrin. Discussed potential risks of this medication. Follow up in 4 weeks and prn.

## 2016-03-07 NOTE — Progress Notes (Signed)
Subjective:    Patient ID: Paul Fields, male    DOB: April 28, 1961, 55 y.o.   MRN: [id number]  HPI  55YO male presents for physical exam.  Anxiety and Depression - Worsening symptoms of anxiety and depressed mood. Notes apathy about normal activities. Continues to have home stressors with children and grandchildren. Denies work stressors.  In the past, used alcohol to treat symptoms, however completed program at [facility] and has not used alcohol for years. Mother and sister both with depression and anxiety. Sister has done well with Wellbutrin. He would like to see a psychiatrist and potentially start medication.  Aside from this, feeling well. No other concerns.   Wt Readings from Last 3 Encounters:  03/07/16 162 lb 6.4 oz (73.664 kg)  04/17/15 162 lb (73.483 kg)  03/24/15 162 lb 12.8 oz (73.846 kg)   BP Readings from Last 3 Encounters:  03/07/16 142/82  04/17/15 113/67  06/22/14 136/85    Past Medical History  Diagnosis Date  . Elbow fracture, right 1968  . Clavicle fracture 1986  . COPD (chronic obstructive pulmonary disease) ([place])   . Diverticulosis   . Hemorrhoid   . Polyp of colon, adenomatous     possible based on genetic testing   Family History  Problem Relation Age of Onset  . COPD Father   . Arthritis Father   . Parkinsonism Mother   . Diabetes Mother     borderline  . Colon cancer Maternal Uncle     diagnosed in his 48s  . Alzheimer's disease Paternal Aunt   . COPD Paternal Uncle   . Lung cancer Maternal Grandfather     Ecologist  . Prostate cancer Maternal Uncle     diagnosed in his 27s  . Alzheimer's disease Paternal Aunt   . Heart attack Paternal Uncle   . Brain cancer Paternal Uncle 33  . Lymphoma Cousin     paternal cousin  . Heart disease Neg Hx   . Stomach cancer Neg Hx   . Rectal cancer Neg Hx   . COPD Brother     prostate   Past Surgical History  Procedure Laterality Date  . Elbow fracture surgery Right 1967    Pins,  then hardware removal  . Elbow hardware removal Right 1969    right  . Wisdom tooth extraction  1983  . Polyps  2013    20 polyps biopsies as tubular adenomas   Social History   Social History  . Marital Status: Married    Spouse Name: N/A  . Number of Children: 3  . Years of Education: N/A   Occupational History  . truck driver    Social History Main Topics  . Smoking status: Current Every Day Smoker -- 1.50 packs/day for 36 years    Types: Cigarettes  . Smokeless tobacco: Never Used     Comment: tobacco info given 08/11/12  . Alcohol Use: No     Comment: 6 pk beer on weekends- no beer for 4 months  . Drug Use: No  . Sexual Activity: Not Asked   Other Topics Concern  . None   Social History Narrative    Review of Systems  Constitutional: Negative for fever, chills, activity change, appetite change, fatigue and unexpected weight change.  HENT: Negative for congestion, postnasal drip, rhinorrhea, sinus pressure and trouble swallowing.   Eyes: Negative for visual disturbance.  Respiratory: Positive for cough (occasional). Negative for shortness of breath.   Cardiovascular: Negative for  chest pain, palpitations and leg swelling.  Gastrointestinal: Negative for nausea, vomiting, abdominal pain, diarrhea, constipation and abdominal distention.  Genitourinary: Negative for dysuria, urgency and difficulty urinating.  Musculoskeletal: Negative for arthralgias and gait problem.  Skin: Negative for color change and rash.  Neurological: Negative for weakness.  Hematological: Negative for adenopathy.  Psychiatric/Behavioral: Positive for dysphoric mood. Negative for suicidal ideas and sleep disturbance. The patient is nervous/anxious.        Objective:    BP 142/82 mmHg  Pulse 79  Temp(Src) 97.9 F (36.6 C) (Oral)  Ht 5\' 9"  (1.753 m)  Wt 162 lb 6.4 oz (73.664 kg)  BMI 23.97 kg/m2  SpO2 97% Physical Exam  Constitutional: He is oriented to person, place, and time. He  appears well-developed and well-nourished. No distress.  HENT:  Head: Normocephalic and atraumatic.  Right Ear: External ear normal.  Left Ear: External ear normal.  Nose: Nose normal.  Mouth/Throat: Oropharynx is clear and moist. No oropharyngeal exudate.  Eyes: Conjunctivae and EOM are normal. Pupils are equal, round, and reactive to light. Right eye exhibits no discharge. Left eye exhibits no discharge. No scleral icterus.  Neck: Normal range of motion. Neck supple. No tracheal deviation present. No thyromegaly present.  Cardiovascular: Normal rate, regular rhythm and normal heart sounds.  Exam reveals no gallop and no friction rub.   No murmur heard. Pulmonary/Chest: Effort normal and breath sounds normal. No respiratory distress. He has no wheezes. He has no rales. He exhibits no tenderness.  Abdominal: Soft. Bowel sounds are normal. He exhibits no distension and no mass. There is no tenderness. There is no rebound and no guarding.  Musculoskeletal: Normal range of motion. He exhibits no edema.  Lymphadenopathy:    He has no cervical adenopathy.  Neurological: He is alert and oriented to person, place, and time. No cranial nerve deficit. Coordination normal.  Skin: Skin is warm and dry. No rash noted. He is not diaphoretic. No erythema. No pallor.  Psychiatric: His speech is normal and behavior is normal. Judgment and thought content normal. His mood appears anxious. Cognition and memory are normal. He exhibits a depressed mood. He expresses no suicidal ideation. He expresses no suicidal plans.          Assessment & Plan:   Problem List Items Addressed This Visit      Unprioritized   Anxiety    Worsening symptoms of anxiety and depression. Will set up psychiatry evaluation and start Wellbutrin. Discussed potential risks of this medication. Follow up in 4 weeks and prn.      Relevant Medications   buPROPion (WELLBUTRIN XL) 150 MG 24 hr tablet   Other Relevant Orders    Ambulatory referral to Psychiatry   Major depressive disorder, recurrent episode ([place])    Symptoms of anxiety and depression. PHQ9 - 13. Will set up psychiatry evaluation. Will start Wellbutrin, given that other family members have responded very well to this. Discussed potential risks of this medication.He will call or email if any changes. No current suicidal ideation.      Relevant Medications   buPROPion (WELLBUTRIN XL) 150 MG 24 hr tablet   Other Relevant Orders   Ambulatory referral to Psychiatry   Routine general medical examination at a health care facility - Primary    General medical exam normal today except as noted. Labs today. Prostate cancer screen with PSA. Discussed potential limits and benefits of PSA and DRE. Colonoscopy UTD and reviewed. Immunizations UTD. Will set up lung cancer  screen with CT chest. Encouraged smoking cessation. Encouraged healthy diet and exercise.      Relevant Orders   CBC with Differential/Platelet   Comprehensive metabolic panel   Lipid panel   VITAMIN D 25 Hydroxy (Vit-D Deficiency, Fractures)   TSH   PSA, total and free    Other Visit Diagnoses    Tobacco abuse        Relevant Orders    CT CHEST LUNG CA SCREEN LOW DOSE W/O CM        Return in about 4 weeks (around 04/04/2016) for Recheck.  Ronette Deter, MD [facility]

## 2016-03-08 ENCOUNTER — Telehealth: Payer: Self-pay | Admitting: Internal Medicine

## 2016-03-08 LAB — PSA, TOTAL AND FREE
PSA FREE PCT: 28 % (ref >25)
PSA, Free: 0.28 ng/mL
PSA: 1.01 ng/mL (ref <=4.00)

## 2016-03-08 NOTE — Telephone Encounter (Signed)
Spoke with patient and reviewed his labs, see result note. thanks

## 2016-03-08 NOTE — Telephone Encounter (Signed)
Patient called back for lab results 

## 2016-03-11 ENCOUNTER — Telehealth: Payer: Self-pay | Admitting: *Deleted

## 2016-03-11 ENCOUNTER — Encounter: Payer: Self-pay | Admitting: *Deleted

## 2016-03-11 NOTE — Telephone Encounter (Signed)
Spoke with patient today regarding a referral from Dr Derrel Nip for a initial  lung cancer screening low dose CT scan is due. Confirmed that patient is within age range of 55-77, asymptomatic of lung cancer, and no other serious disease processes that would make treatment of lung cancer not possible. The patient is a  current smoker with a 1.5 pack per day for 38 years history. The shared decision making visit is scheduled for 03/15/2016 at 2 PM The patient is agreeable for CT scan to be scheduled once insurance has been authorized.

## 2016-03-14 ENCOUNTER — Other Ambulatory Visit: Payer: Self-pay | Admitting: Family Medicine

## 2016-03-14 DIAGNOSIS — Z87891 Personal history of nicotine dependence: Secondary | ICD-10-CM

## 2016-03-15 ENCOUNTER — Encounter: Payer: Self-pay | Admitting: Family Medicine

## 2016-03-15 ENCOUNTER — Ambulatory Visit
Admission: RE | Admit: 2016-03-15 | Discharge: 2016-03-15 | Disposition: A | Discharge status: Home / Self Care | Payer: BLUE CROSS/BLUE SHIELD | Source: Ambulatory Visit | Attending: Family Medicine | Admitting: Family Medicine

## 2016-03-15 ENCOUNTER — Inpatient Hospital Stay: Payer: BLUE CROSS/BLUE SHIELD | Attending: Family Medicine | Admitting: Family Medicine

## 2016-03-15 DIAGNOSIS — D71 Functional disorders of polymorphonuclear neutrophils: Secondary | ICD-10-CM | POA: Diagnosis not present

## 2016-03-15 DIAGNOSIS — R918 Other nonspecific abnormal finding of lung field: Secondary | ICD-10-CM | POA: Insufficient documentation

## 2016-03-15 DIAGNOSIS — Z122 Encounter for screening for malignant neoplasm of respiratory organs: Secondary | ICD-10-CM

## 2016-03-15 DIAGNOSIS — Z87891 Personal history of nicotine dependence: Secondary | ICD-10-CM | POA: Insufficient documentation

## 2016-03-15 NOTE — Progress Notes (Signed)
In accordance with CMS guidelines, patient has meet eligibility criteria including age, absence of signs or symptoms of lung cancer, the specific calculation of cigarette smoking pack-years was 57 years and is a current smoker.   A shared decision-making session was conducted prior to the performance of CT scan. This includes one or more decision aids, includes benefits and harms of screening, follow-up diagnostic testing, over-diagnosis, false positive rate, and total radiation exposure.  Counseling on the importance of adherence to annual lung cancer LDCT screening, impact of co-morbidities, and ability or willingness to undergo diagnosis and treatment is imperative for compliance of the program.  Counseling on the importance of continued smoking cessation for former smokers; the importance of smoking cessation for current smokers and information about tobacco cessation interventions have been given to patient including the [place] at [facility], 1800 quit [place], as well as [place] specific smoking cessation programs.  Written order for lung cancer screening with LDCT has been given to the patient and any and all questions have been answered to the best of my abilities.   Yearly follow up will be scheduled by Burgess Estelle, Thoracic Navigator.

## 2016-03-18 ENCOUNTER — Other Ambulatory Visit: Payer: Self-pay | Admitting: Family Medicine

## 2016-03-18 ENCOUNTER — Telehealth: Payer: Self-pay | Admitting: *Deleted

## 2016-03-18 DIAGNOSIS — R9389 Abnormal findings on diagnostic imaging of other specified body structures: Secondary | ICD-10-CM

## 2016-03-18 NOTE — Telephone Encounter (Signed)
Message left for Dr. Ronette Deter re: LDCT results.  Also notified patient of abnormal findings. Appt with Dr. Genevive Bi has been made for Friday May 26th @ 9:30. Patients questions were answered fully and advised him to please call us back with any further questions.

## 2016-03-18 NOTE — Telephone Encounter (Signed)
FYI The nurse practitioner from radiology reported the low dose CT scan screening found a lung nodule. The patient will be seen by Dr.Oaks, the cardio lung surgeon this week.

## 2016-03-22 ENCOUNTER — Encounter: Payer: Self-pay | Admitting: Cardiothoracic Surgery

## 2016-03-22 ENCOUNTER — Ambulatory Visit (INDEPENDENT_AMBULATORY_CARE_PROVIDER_SITE_OTHER): Payer: BLUE CROSS/BLUE SHIELD | Admitting: Cardiothoracic Surgery

## 2016-03-22 ENCOUNTER — Other Ambulatory Visit: Payer: Self-pay | Admitting: Internal Medicine

## 2016-03-22 ENCOUNTER — Telehealth: Payer: Self-pay | Admitting: *Deleted

## 2016-03-22 ENCOUNTER — Telehealth: Payer: Self-pay

## 2016-03-22 ENCOUNTER — Encounter (INDEPENDENT_AMBULATORY_CARE_PROVIDER_SITE_OTHER): Payer: Self-pay

## 2016-03-22 VITALS — BP 146/89 | HR 66 | Temp 98.7°F | Ht 69.0 in | Wt 159.0 lb

## 2016-03-22 DIAGNOSIS — R911 Solitary pulmonary nodule: Secondary | ICD-10-CM

## 2016-03-22 NOTE — Telephone Encounter (Signed)
This pt has extreme anxiety. He needs to be seen before starting Chantix

## 2016-03-22 NOTE — Progress Notes (Signed)
Patient ID: Paul Fields, male   DOB: 12-27-1960, 55 y.o.   MRN: [id number]  Chief Complaint  Patient presents with  . Other    Lung Cancer    HPI Paul Fields is a 55 y.o. male.  This patient is a 55 year old man with a long smoking history who has an episode of major depression and was scheduled to follow-up with a psychiatrist. As part of that overall evaluation he was scheduled to meet with his primary care physician to get a referral to the psychiatrist and as such a CT scan was ordered. CT scan was performed because of his long-standing smoking history of between 2 and 3 packs cigarettes per day for over 40 years. States that he had an abnormal CT scan was subsequently referred here. He does have a long smoking history and a nonproductive cough. He states that he does not get short of breath and is able to walk considerable distances. He's not had any hemoptysis fever or chills or weight loss. He has a history of reflux. He's had an upper GI in the past. He has not had a prior CT scan. There is a strong family history of lung cancer in a maternal grandfather and in his father. In addition he has a history of colon cancer in his father and uncle and a sister with breast cancer.    Past Medical History  Diagnosis Date  . Elbow fracture, right 1968  . Clavicle fracture 1986  . COPD (chronic obstructive pulmonary disease) ([place])   . Diverticulosis   . Hemorrhoid   . Polyp of colon, adenomatous     possible based on genetic testing    Past Surgical History  Procedure Laterality Date  . Elbow fracture surgery Right 1967    Pins, then hardware removal  . Elbow hardware removal Right 1969    right  . Wisdom tooth extraction  1983  . Polyps  2013    20 polyps biopsies as tubular adenomas  . Colonoscopy w/ biopsies  04/2015    polyps-benign    Family History  Problem Relation Age of Onset  . COPD Father   . Arthritis Father   . Parkinsonism Mother   . Diabetes Mother    borderline  . Colon cancer Maternal Uncle     diagnosed in his 51s  . Alzheimer's disease Paternal Aunt   . COPD Paternal Uncle   . Lung cancer Maternal Grandfather     Ecologist  . Prostate cancer Maternal Uncle     diagnosed in his 67s  . Alzheimer's disease Paternal Aunt   . Heart attack Paternal Uncle   . Brain cancer Paternal Uncle 6  . Lymphoma Cousin     paternal cousin  . Heart disease Neg Hx   . Stomach cancer Neg Hx   . Rectal cancer Neg Hx   . COPD Brother     prostate  . Cancer Paternal Grandfather     lung    Social History Social History  Substance Use Topics  . Smoking status: Current Every Day Smoker -- 2.00 packs/day for 38 years    Types: Cigarettes  . Smokeless tobacco: Never Used     Comment: tobacco info given 08/11/12  . Alcohol Use: No     Comment: 6 pk beer on weekends- no beer for 4 months    No Known Allergies  Current Outpatient Prescriptions  Medication Sig Dispense Refill  . buPROPion (WELLBUTRIN XL) 150 MG 24 hr  tablet Take 1 tablet (150 mg total) by mouth daily. 30 tablet 3  . cholecalciferol (VITAMIN D) 1000 units tablet Take 1,000 Units by mouth 2 (two) times daily.      No current facility-administered medications for this visit.    Location, Quality, Duration, Severity, Timing, Context, Modifying Factors, Associated Signs and Symptoms.  Review of Systems A complete review of systems was asked and was negative except for the following positive findings nonproductive cough. Shortness of breath with exertion. Depression.  Blood pressure 146/89, pulse 66, temperature 98.7 F (37.1 C), temperature source Oral, height 5\' 9"  (1.753 m), weight 159 lb (72.122 kg), SpO2 97 %.  Physical Exam CONSTITUTIONAL:  Pleasant, well-developed, well-nourished, and in no acute distress. EYES: Pupils equal and reactive to light, Sclera non-icteric EARS, NOSE, MOUTH AND THROAT:  The oropharynx was clear.  Dentition is good repair.  Oral mucosa pink  and moist. LYMPH NODES:  Lymph nodes in the neck and axillae were normal RESPIRATORY:  Lungs were clear.  Normal respiratory effort without pathologic use of accessory muscles of respiration CARDIOVASCULAR: Heart was regular without murmurs.  There were no carotid bruits. GI: The abdomen was soft, nontender, and nondistended. There were no palpable masses. There was no hepatosplenomegaly. There were normal bowel sounds in all quadrants. GU:   MUSCULOSKELETAL:  Normal muscle strength and tone.  No clubbing or cyanosis.   SKIN:  There were no pathologic skin lesions.  There were no nodules on palpation. NEUROLOGIC:  Sensation is normal.  Cranial nerves are grossly intact. PSYCH:  Oriented to person, place and time.  Mood and affect are normal.  Data Reviewed Low-dose screening CT  I have personally reviewed the patient's imaging and medical records.    Assessment    I have independently reviewed the patient's CT scan. There is a suspicious nodule measuring about 1 cm in the right upper lobe. This is more centrally located would be difficult to biopsy percutaneously.    Plan    I had a long discussion with the patient today about the options. Options of CT scan in 3 months, PET scan and/or ENB were discussed. I also reviewed with him the necessity for smoking cessation. He has requested some Chantix but I see that he is already taking Wellbutrin and I therefore asked that he follow-up with his primary care physician for those medications. He will think about his options and contact me next week. He did get my business card. All of his questions were answered.       Nestor Lewandowsky, MD 03/22/2016, 10:31 AM

## 2016-03-22 NOTE — Telephone Encounter (Signed)
Called patient to let him know that his PET Scan will be on Friday 03/29/2016 at 9:30 AM but to arrive at 9:00 AM at the [facility]. Patient was instructed not to eat or drink after midnight.  I also told patient that I called his PCP to let them know that patient would like to start Chantix to help him stop smoking.  I then told him that Dr. Genevive Bi will see him on 04/02/2016 at 9:30 AM at the [place]. Patient understood everything.

## 2016-03-22 NOTE — Patient Instructions (Addendum)
Please go ome and think on what you would want to do. Remember that we spoke about a CT Scan in three months, PET Scan and a Bronchoscopy soon. Once you decide on what you want to do, please give [place] a call.  Please review your medical history and let your primary care doctor know which one doesn't belong.

## 2016-03-22 NOTE — Telephone Encounter (Signed)
Dr.Oaks (cardio Tree surgeon) has requested for patient to stop smoking, he requested the PCP prescribe chantix. For any question please call Mar[place]za at [phone]

## 2016-03-26 NOTE — Telephone Encounter (Signed)
Can you please schedule a 30 OV for this patient to discuss prior to starting Chantix, thanks

## 2016-03-29 ENCOUNTER — Ambulatory Visit
Admission: RE | Admit: 2016-03-29 | Discharge: 2016-03-29 | Disposition: A | Discharge status: Home / Self Care | Payer: BLUE CROSS/BLUE SHIELD | Source: Ambulatory Visit | Attending: Cardiothoracic Surgery | Admitting: Cardiothoracic Surgery

## 2016-03-29 ENCOUNTER — Telehealth: Payer: Self-pay | Admitting: Cardiothoracic Surgery

## 2016-03-29 DIAGNOSIS — R911 Solitary pulmonary nodule: Secondary | ICD-10-CM | POA: Diagnosis not present

## 2016-03-29 DIAGNOSIS — I898 Other specified noninfective disorders of lymphatic vessels and lymph nodes: Secondary | ICD-10-CM | POA: Diagnosis not present

## 2016-03-29 DIAGNOSIS — D71 Functional disorders of polymorphonuclear neutrophils: Secondary | ICD-10-CM | POA: Diagnosis not present

## 2016-03-29 DIAGNOSIS — R918 Other nonspecific abnormal finding of lung field: Secondary | ICD-10-CM | POA: Diagnosis not present

## 2016-03-29 LAB — GLUCOSE, CAPILLARY: Glucose-Capillary: 87 mg/dL (ref 65–99)

## 2016-03-29 MED ORDER — FLUDEOXYGLUCOSE F - 18 (FDG) INJECTION
13.1400 | Freq: Once | INTRAVENOUS | Status: AC | PRN
  Administered 2016-03-29: 13.14 via INTRAVENOUS

## 2016-03-29 NOTE — Telephone Encounter (Signed)
I have sent a referral to [facility] through [facility]. I will continue to check on this until the appointment is obtained.

## 2016-04-02 ENCOUNTER — Ambulatory Visit (INDEPENDENT_AMBULATORY_CARE_PROVIDER_SITE_OTHER): Payer: BLUE CROSS/BLUE SHIELD | Admitting: Cardiothoracic Surgery

## 2016-04-02 ENCOUNTER — Telehealth: Payer: Self-pay

## 2016-04-02 ENCOUNTER — Encounter: Payer: Self-pay | Admitting: Cardiothoracic Surgery

## 2016-04-02 VITALS — BP 110/72 | HR 75 | Temp 98.0°F | Ht 69.0 in | Wt 157.2 lb

## 2016-04-02 DIAGNOSIS — R911 Solitary pulmonary nodule: Secondary | ICD-10-CM

## 2016-04-02 NOTE — Telephone Encounter (Signed)
Called patient to let him know that his appointment for his CT Scan will be on 05/16/2016 at 2:00 PM but to arrive at 1:45 PM at the [facility] location. Patient was also told to be fasting four hours prior to his appointment. Patient understood.

## 2016-04-02 NOTE — Patient Instructions (Signed)
Please come to your appointment in 6 weeks. We will schedule your CT Scan before so we could see your results when you come in on 05/17/2016.

## 2016-04-02 NOTE — Addendum Note (Signed)
Addended by: Wayna Chalet on: 04/02/2016 02:18 PM   Modules accepted: Orders

## 2016-04-02 NOTE — Progress Notes (Signed)
[facility] Post-Op Note  Patient ID: NAVRAJ SAMPAYO, male   DOB: 1961/09/25, 55 y.o.   MRN: [id number]  HISTORY: This patient underwent a PET scan which revealed only slight uptake in the right upper lobe nodule. However this was still concerning for malignancy. There is no evidence of distant disease. Unfortunately does continue to smoke. He has an appointment with his primary care physician for smoking cessation issues. He denies any new problems. He denies any fevers or chills. He denies any shortness of breath.   Filed Vitals:   04/02/16 0923  BP: 110/72  Pulse: 75  Temp: 98 F (36.7 C)     EXAM: Resp: Lungs are clear bilaterally.  No respiratory distress, normal effort. Heart:  Regular without murmurs Abd:  Abdomen is soft, non distended and non tender. No masses are palpable.  There is no rebound and no guarding.  Neurological: Alert and oriented to person, place, and time. Coordination normal.  Skin: Skin is warm and dry. No rash noted. No diaphoretic. No erythema. No pallor.  Psychiatric: Normal mood and affect. Normal behavior. Judgment and thought content normal.    ASSESSMENT: I have independently reviewed the PET scan discuss the results with the patient. There is slight uptake in the right upper lobe nodule which would be consistent with either a malignancy or an infectious or inflammatory process.   PLAN:   After extensive review with the patient at this point we have elected to bring him back in one month for repeat chest CT. If the lesion is still present then we could consider biopsy or resection. He will follow-up with his primary care physician later this week for smoking cessation.    Nestor Lewandowsky, MD

## 2016-04-04 ENCOUNTER — Ambulatory Visit (INDEPENDENT_AMBULATORY_CARE_PROVIDER_SITE_OTHER): Payer: BLUE CROSS/BLUE SHIELD | Admitting: Internal Medicine

## 2016-04-04 ENCOUNTER — Encounter: Payer: Self-pay | Admitting: Internal Medicine

## 2016-04-04 VITALS — BP 132/60 | HR 75 | Ht 69.0 in | Wt 160.0 lb

## 2016-04-04 DIAGNOSIS — Z720 Tobacco use: Secondary | ICD-10-CM

## 2016-04-04 DIAGNOSIS — F419 Anxiety disorder, unspecified: Secondary | ICD-10-CM | POA: Diagnosis not present

## 2016-04-04 DIAGNOSIS — R911 Solitary pulmonary nodule: Secondary | ICD-10-CM | POA: Diagnosis not present

## 2016-04-04 MED ORDER — VARENICLINE TARTRATE 0.5 MG X 11 & 1 MG X 42 PO MISC: ORAL | Status: DC

## 2016-04-04 NOTE — Assessment & Plan Note (Signed)
Symptoms unchanged on Wellbutin. Discussed increase in dose of medication, but will hold for now, while starting Chantix.

## 2016-04-04 NOTE — Patient Instructions (Signed)
Start Chantix.  Continue Wellbutrin.  Follow up 2 weeks.

## 2016-04-04 NOTE — Assessment & Plan Note (Addendum)
Reviewed CT chest and PET scan. Reviewed notes from Dr. Faith Rogue. Reviewed potential diagnoses with pt. Plan for follow up imaging in 1 month with CT chest and neck.

## 2016-04-04 NOTE — Assessment & Plan Note (Signed)
Encouraged smoking cessation. Will start Chantix. Discussed risks of this medication. Follow up in 2 weeks and prn.

## 2016-04-04 NOTE — Progress Notes (Signed)
Subjective:    Patient ID: Paul Fields, male    DOB: 1961/03/08, 55 y.o.   MRN: [id number]  HPI  55YO male presents for follow up.  Recent CT chest showed right upper lung opacity. PET CT showed "low but measurable" metabolic activity in this area.  Feeling well. Would like to quit smoking. Taking Wellbutrin with no improvement.   Wt Readings from Last 3 Encounters:  04/04/16 160 lb (72.576 kg)  04/02/16 157 lb 3.2 oz (71.305 kg)  03/22/16 159 lb (72.122 kg)   BP Readings from Last 3 Encounters:  04/04/16 132/60  04/02/16 110/72  03/22/16 146/89    Past Medical History  Diagnosis Date  . Elbow fracture, right 1968  . Clavicle fracture 1986  . COPD (chronic obstructive pulmonary disease) ([place])   . Diverticulosis   . Hemorrhoid   . Polyp of colon, adenomatous     possible based on genetic testing   Family History  Problem Relation Age of Onset  . COPD Father   . Arthritis Father   . Parkinsonism Mother   . Diabetes Mother     borderline  . Colon cancer Maternal Uncle     diagnosed in his 30s  . Alzheimer's disease Paternal Aunt   . COPD Paternal Uncle   . Lung cancer Maternal Grandfather     Ecologist  . Prostate cancer Maternal Uncle     diagnosed in his 39s  . Alzheimer's disease Paternal Aunt   . Heart attack Paternal Uncle   . Brain cancer Paternal Uncle 38  . Lymphoma Cousin     paternal cousin  . Heart disease Neg Hx   . Stomach cancer Neg Hx   . Rectal cancer Neg Hx   . COPD Brother     prostate  . Cancer Paternal Grandfather     lung   Past Surgical History  Procedure Laterality Date  . Elbow fracture surgery Right 1967    Pins, then hardware removal  . Elbow hardware removal Right 1969    right  . Wisdom tooth extraction  1983  . Polyps  2013    20 polyps biopsies as tubular adenomas  . Colonoscopy w/ biopsies  04/2015    polyps-benign   Social History   Social History  . Marital Status: Married    Spouse Name: N/A  .  Number of Children: 3  . Years of Education: N/A   Occupational History  . truck driver    Social History Main Topics  . Smoking status: Current Every Day Smoker -- 2.00 packs/day for 38 years    Types: Cigarettes  . Smokeless tobacco: Never Used     Comment: tobacco info given 08/11/12  . Alcohol Use: No     Comment: 6 pk beer on weekends- no beer for 4 months  . Drug Use: No  . Sexual Activity: Not Asked   Other Topics Concern  . None   Social History Narrative    Review of Systems  Constitutional: Negative for fever, chills, activity change, appetite change, fatigue and unexpected weight change.  Eyes: Negative for visual disturbance.  Respiratory: Negative for cough and shortness of breath.   Cardiovascular: Negative for chest pain, palpitations and leg swelling.  Gastrointestinal: Negative for abdominal pain, diarrhea, constipation and abdominal distention.  Genitourinary: Negative for dysuria, urgency and difficulty urinating.  Musculoskeletal: Negative for arthralgias and gait problem.  Skin: Negative for color change and rash.  Hematological: Negative for adenopathy.  Psychiatric/Behavioral:  Negative for sleep disturbance and dysphoric mood. The patient is nervous/anxious.        Objective:    BP 132/60 mmHg  Pulse 75  Ht 5\' 9"  (1.753 m)  Wt 160 lb (72.576 kg)  BMI 23.62 kg/m2  SpO2 96% Physical Exam  Constitutional: He is oriented to person, place, and time. He appears well-developed and well-nourished. No distress.  HENT:  Head: Normocephalic and atraumatic.  Right Ear: External ear normal.  Left Ear: External ear normal.  Nose: Nose normal.  Mouth/Throat: Oropharynx is clear and moist.  Eyes: Conjunctivae and EOM are normal. Pupils are equal, round, and reactive to light. Right eye exhibits no discharge. Left eye exhibits no discharge. No scleral icterus.  Neck: Normal range of motion. Neck supple. No tracheal deviation present. No thyromegaly present.    Cardiovascular: Normal rate, regular rhythm and normal heart sounds.  Exam reveals no gallop and no friction rub.   No murmur heard. Pulmonary/Chest: Effort normal and breath sounds normal. No accessory muscle usage. No tachypnea. No respiratory distress. He has no decreased breath sounds. He has no wheezes. He has no rhonchi. He has no rales. He exhibits no tenderness.  Musculoskeletal: Normal range of motion. He exhibits no edema.  Lymphadenopathy:    He has no cervical adenopathy.  Neurological: He is alert and oriented to person, place, and time. No cranial nerve deficit. Coordination normal.  Skin: Skin is warm and dry. No rash noted. He is not diaphoretic. No erythema. No pallor.  Psychiatric: He has a normal mood and affect. His behavior is normal. Judgment and thought content normal.          Assessment & Plan:   Problem List Items Addressed This Visit      Unprioritized   Anxiety    Symptoms unchanged on Wellbutin. Discussed increase in dose of medication, but will hold for now, while starting Chantix.      Nodule of right lung - Primary    Reviewed CT chest and PET scan. Reviewed notes from Dr. Faith Rogue. Reviewed potential diagnoses with pt. Plan for follow up imaging in 1 month with CT chest and neck.      Tobacco use    Encouraged smoking cessation. Will start Chantix. Discussed risks of this medication. Follow up in 2 weeks and prn.          Return in about 2 weeks (around 04/18/2016) for Recheck.  Ronette Deter, MD Internal Medicine [facility]

## 2016-04-10 ENCOUNTER — Telehealth: Payer: Self-pay | Admitting: Cardiothoracic Surgery

## 2016-04-10 NOTE — Telephone Encounter (Signed)
Patient called and was concerned if he should go to [place] before he sees Dr. Genevive Bi in July. He said he wasn't sure what was going on. Please call.

## 2016-04-10 NOTE — Telephone Encounter (Signed)
After reviewing chart, patient was told to hold off on Pulmonary Rehab until patient is seen in office next and scan has been repeated.

## 2016-04-24 ENCOUNTER — Ambulatory Visit (INDEPENDENT_AMBULATORY_CARE_PROVIDER_SITE_OTHER): Payer: BLUE CROSS/BLUE SHIELD | Admitting: Internal Medicine

## 2016-04-24 ENCOUNTER — Encounter: Payer: Self-pay | Admitting: Internal Medicine

## 2016-04-24 VITALS — BP 128/56 | HR 86 | Ht 69.0 in | Wt 163.6 lb

## 2016-04-24 DIAGNOSIS — Z720 Tobacco use: Secondary | ICD-10-CM

## 2016-04-24 DIAGNOSIS — R911 Solitary pulmonary nodule: Secondary | ICD-10-CM | POA: Diagnosis not present

## 2016-04-24 DIAGNOSIS — F419 Anxiety disorder, unspecified: Secondary | ICD-10-CM

## 2016-04-24 MED ORDER — VARENICLINE TARTRATE 1 MG PO TABS: 1.0000 mg | ORAL_TABLET | Freq: Two times a day (BID) | ORAL | Status: DC

## 2016-04-24 NOTE — Assessment & Plan Note (Signed)
Doing well on Chantix. Stopped smoking 6/20. Tolerating medication well. Encouraged him to continue efforts.

## 2016-04-24 NOTE — Assessment & Plan Note (Signed)
Follow up after CT chest on 7/20

## 2016-04-24 NOTE — Progress Notes (Signed)
Subjective:    Patient ID: Paul Fields, male    DOB: Sep 08, 1961, 55 y.o.   MRN: [id number]  HPI  55YO male presents for follow up.  Recently started on Chantix. Tolerating well. No side effects noted.   Seen by Dr. Nicolasa Ducking. Changed to short acting Wellbutrin 75mg  bid, but has not yet started. Has follow up on 7/12. Anxiety has been well controlled with Wellbutrin.  Wt Readings from Last 3 Encounters:  04/24/16 163 lb 9.6 oz (74.208 kg)  04/04/16 160 lb (72.576 kg)  04/02/16 157 lb 3.2 oz (71.305 kg)   BP Readings from Last 3 Encounters:  04/24/16 128/56  04/04/16 132/60  04/02/16 110/72    Past Medical History  Diagnosis Date  . Elbow fracture, right 1968  . Clavicle fracture 1986  . COPD (chronic obstructive pulmonary disease) ([place])   . Diverticulosis   . Hemorrhoid   . Polyp of colon, adenomatous     possible based on genetic testing   Family History  Problem Relation Age of Onset  . COPD Father   . Arthritis Father   . Parkinsonism Mother   . Diabetes Mother     borderline  . Colon cancer Maternal Uncle     diagnosed in his 94s  . Alzheimer's disease Paternal Aunt   . COPD Paternal Uncle   . Lung cancer Maternal Grandfather     Ecologist  . Prostate cancer Maternal Uncle     diagnosed in his 5s  . Alzheimer's disease Paternal Aunt   . Heart attack Paternal Uncle   . Brain cancer Paternal Uncle 11  . Lymphoma Cousin     paternal cousin  . Heart disease Neg Hx   . Stomach cancer Neg Hx   . Rectal cancer Neg Hx   . COPD Brother     prostate  . Cancer Paternal Grandfather     lung   Past Surgical History  Procedure Laterality Date  . Elbow fracture surgery Right 1967    Pins, then hardware removal  . Elbow hardware removal Right 1969    right  . Wisdom tooth extraction  1983  . Polyps  2013    20 polyps biopsies as tubular adenomas  . Colonoscopy w/ biopsies  04/2015    polyps-benign   Social History   Social History  . Marital  Status: Married    Spouse Name: N/A  . Number of Children: 3  . Years of Education: N/A   Occupational History  . truck driver    Social History Main Topics  . Smoking status: Former Smoker -- 2.00 packs/day for 38 years    Types: Cigarettes    Quit date: 04/16/2016  . Smokeless tobacco: Never Used     Comment: tobacco info given 08/11/12  . Alcohol Use: No     Comment: 6 pk beer on weekends- no beer for 4 months  . Drug Use: No  . Sexual Activity: Not Asked   Other Topics Concern  . None   Social History Narrative    Review of Systems  Constitutional: Negative for fever, chills, activity change, appetite change, fatigue and unexpected weight change.  Eyes: Negative for visual disturbance.  Respiratory: Negative for cough and shortness of breath.   Cardiovascular: Negative for chest pain, palpitations and leg swelling.  Gastrointestinal: Negative for abdominal pain and abdominal distention.  Genitourinary: Negative for dysuria, urgency and difficulty urinating.  Musculoskeletal: Negative for arthralgias and gait problem.  Skin: Negative for  color change and rash.  Hematological: Negative for adenopathy.  Psychiatric/Behavioral: Negative for sleep disturbance and dysphoric mood. The patient is nervous/anxious.        Objective:    BP 128/56 mmHg  Pulse 86  Ht 5\' 9"  (1.753 m)  Wt 163 lb 9.6 oz (74.208 kg)  BMI 24.15 kg/m2  SpO2 95% Physical Exam  Constitutional: He is oriented to person, place, and time. He appears well-developed and well-nourished. No distress.  HENT:  Head: Normocephalic and atraumatic.  Right Ear: External ear normal.  Left Ear: External ear normal.  Nose: Nose normal.  Mouth/Throat: Oropharynx is clear and moist. No oropharyngeal exudate.  Eyes: Conjunctivae and EOM are normal. Pupils are equal, round, and reactive to light. Right eye exhibits no discharge. Left eye exhibits no discharge. No scleral icterus.  Neck: Normal range of motion.  Neck supple. No tracheal deviation present. No thyromegaly present.  Cardiovascular: Normal rate, regular rhythm and normal heart sounds.  Exam reveals no gallop and no friction rub.   No murmur heard. Pulmonary/Chest: Effort normal and breath sounds normal. No accessory muscle usage. No tachypnea. No respiratory distress. He has no decreased breath sounds. He has no wheezes. He has no rhonchi. He has no rales. He exhibits no tenderness.  Musculoskeletal: Normal range of motion. He exhibits no edema.  Lymphadenopathy:    He has no cervical adenopathy.  Neurological: He is alert and oriented to person, place, and time. No cranial nerve deficit. Coordination normal.  Skin: Skin is warm and dry. No rash noted. He is not diaphoretic. No erythema. No pallor.  Psychiatric: He has a normal mood and affect. His behavior is normal. Judgment and thought content normal.          Assessment & Plan:   Problem List Items Addressed This Visit      Unprioritized   Anxiety    Reviewed notes from Dr. Nicolasa Ducking. Will continue Wellbutrin.      Nodule of right lung    Follow up after CT chest on 7/20      Tobacco use - Primary    Doing well on Chantix. Stopped smoking 6/20. Tolerating medication well. Encouraged him to continue efforts.          Return in about 4 weeks (around 05/22/2016) for Recheck.  Ronette Deter, MD Internal Medicine [facility]

## 2016-04-24 NOTE — Progress Notes (Signed)
Pre visit review using our clinic review tool, if applicable. No additional management support is needed unless otherwise documented below in the visit note. 

## 2016-04-24 NOTE — Assessment & Plan Note (Signed)
Reviewed notes from Dr. Nicolasa Ducking. Will continue Wellbutrin.

## 2016-05-14 ENCOUNTER — Other Ambulatory Visit: Payer: Self-pay

## 2016-05-16 ENCOUNTER — Ambulatory Visit
Admission: RE | Admit: 2016-05-16 | Discharge: 2016-05-16 | Disposition: A | Discharge status: Home / Self Care | Payer: BLUE CROSS/BLUE SHIELD | Source: Ambulatory Visit | Attending: Cardiothoracic Surgery | Admitting: Cardiothoracic Surgery

## 2016-05-16 DIAGNOSIS — R911 Solitary pulmonary nodule: Secondary | ICD-10-CM | POA: Diagnosis present

## 2016-05-16 DIAGNOSIS — R918 Other nonspecific abnormal finding of lung field: Secondary | ICD-10-CM | POA: Insufficient documentation

## 2016-05-16 DIAGNOSIS — J439 Emphysema, unspecified: Secondary | ICD-10-CM | POA: Diagnosis not present

## 2016-05-16 DIAGNOSIS — I700 Atherosclerosis of aorta: Secondary | ICD-10-CM | POA: Diagnosis not present

## 2016-05-16 MED ORDER — IOPAMIDOL (ISOVUE-300) INJECTION 61%
75.0000 mL | Freq: Once | INTRAVENOUS | Status: AC | PRN
  Administered 2016-05-16: 75 mL via INTRAVENOUS

## 2016-05-17 ENCOUNTER — Encounter: Payer: Self-pay | Admitting: Cardiothoracic Surgery

## 2016-05-17 ENCOUNTER — Ambulatory Visit (INDEPENDENT_AMBULATORY_CARE_PROVIDER_SITE_OTHER): Payer: BLUE CROSS/BLUE SHIELD | Admitting: Cardiothoracic Surgery

## 2016-05-17 VITALS — BP 120/71 | HR 67 | Temp 98.2°F | Ht 69.0 in | Wt 169.2 lb

## 2016-05-17 DIAGNOSIS — R918 Other nonspecific abnormal finding of lung field: Secondary | ICD-10-CM

## 2016-05-17 NOTE — Patient Instructions (Signed)
We will have you follow-up in 4 months with a CT Scan before. I will call you when it is time to schedule this.

## 2016-05-17 NOTE — Progress Notes (Signed)
[facility] Post-Op [facility]  Patient ID: Paul Fields, male   DOB: May 26, 1961, 55 y.o.   MRN: [id number]  HISTORY: He returns today in follow-up. He has now quit smoking for a month. He states his breathing is improved. He is less short of breath unless coughing. With the extreme he does get somewhat short of breath whenever he is working outside.   Filed Vitals:   05/17/16 0856  BP: 120/71  Pulse: 67  Temp: 98.2 F (36.8 C)     EXAM: Resp: Lungs are clear bilaterally.  No respiratory distress, normal effort. Heart:  Regular without murmurs Abd:  Abdomen is soft, non distended and non tender. No masses are palpable.  There is no rebound and no guarding.  Neurological: Alert and oriented to person, place, and time. Coordination normal.  Skin: Skin is warm and dry. No rash noted. No diaphoretic. No erythema. No pallor.  Psychiatric: Normal mood and affect. Normal behavior. Judgment and thought content normal.    ASSESSMENT: He did have a chest CT scan made. I reviewed that. The lesion in the right upper lobe is considerably smaller. It's not completely gone however. This makes the diagnosis of a malignancy very remote.   PLAN:   I would like to see him back again in 4 months time. We will repeat the chest CT at that point. I congratulated him on his smoking cessation.    Nestor Lewandowsky, MD

## 2016-05-31 ENCOUNTER — Ambulatory Visit: Payer: BLUE CROSS/BLUE SHIELD | Admitting: Internal Medicine

## 2016-05-31 DIAGNOSIS — Z0289 Encounter for other administrative examinations: Secondary | ICD-10-CM

## 2016-10-28 HISTORY — PX: OTHER SURGICAL HISTORY: SHX169

## 2016-11-26 ENCOUNTER — Ambulatory Visit (INDEPENDENT_AMBULATORY_CARE_PROVIDER_SITE_OTHER): Payer: BLUE CROSS/BLUE SHIELD | Admitting: Family Medicine

## 2016-11-26 ENCOUNTER — Encounter: Payer: Self-pay | Admitting: Family Medicine

## 2016-11-26 VITALS — BP 104/56 | HR 72 | Temp 98.0°F | Resp 16 | Ht 69.0 in | Wt 167.0 lb

## 2016-11-26 DIAGNOSIS — M542 Cervicalgia: Secondary | ICD-10-CM | POA: Diagnosis not present

## 2016-11-26 DIAGNOSIS — R490 Dysphonia: Secondary | ICD-10-CM | POA: Diagnosis not present

## 2016-11-26 DIAGNOSIS — R911 Solitary pulmonary nodule: Secondary | ICD-10-CM

## 2016-11-26 DIAGNOSIS — M79671 Pain in right foot: Secondary | ICD-10-CM | POA: Diagnosis not present

## 2016-11-26 NOTE — Patient Instructions (Signed)
Nice to see you. We will refer you to podiatry for your toe. We will refer you back to thoracic surgery for your lung nodule. Monitor your hoarseness and if it does not improve over the next 1-2 weeks please let us know so we can refer you to ENT. Please do the following exercises for your neck. You may take ibuprofen or Aleve to help with any neck discomfort. If you develop numbness, weakness, worsening neck pain, or any new or changing symptoms please seek medical attention immediately.   Cervical Strain and Sprain Rehab Ask your health care provider which exercises are safe for you. Do exercises exactly as told by your health care provider and adjust them as directed. It is normal to feel mild stretching, pulling, tightness, or discomfort as you do these exercises, but you should stop right away if you feel sudden pain or your pain gets worse.Do not begin these exercises until told by your health care provider. Stretching and range of motion exercises These exercises warm up your muscles and joints and improve the movement and flexibility of your neck. These exercises also help to relieve pain, numbness, and tingling. Exercise A: Cervical side bend 1. Using good posture, sit on a stable chair or stand up. 2. Without moving your shoulders, slowly tilt your left / right ear to your shoulder until you feel a stretch in your neck muscles. You should be looking straight ahead. 3. Hold for __________ seconds. 4. Repeat with the other side of your neck. Repeat __________ times. Complete this exercise __________ times a day. Exercise B: Cervical rotation 1. Using good posture, sit on a stable chair or stand up. 2. Slowly turn your head to the side as if you are looking over your left / right shoulder.  Keep your eyes level with the ground.  Stop when you feel a stretch along the side and the back of your neck. 3. Hold for __________ seconds. 4. Repeat this by turning to your other side. Repeat  __________ times. Complete this exercise __________ times a day. Exercise C: Thoracic extension and pectoral stretch 1. Roll a towel or a small blanket so it is about 4 inches (10 cm) in diameter. 2. Lie down on your back on a firm surface. 3. Put the towel lengthwise, under your spine in the middle of your back. It should not be not under your shoulder blades. The towel should line up with your spine from your middle back to your lower back. 4. Put your hands behind your head and let your elbows fall out to your sides. 5. Hold for __________ seconds. Repeat __________ times. Complete this exercise __________ times a day. Strengthening exercises These exercises build strength and endurance in your neck. Endurance is the ability to use your muscles for a long time, even after your muscles get tired. Exercise D: Upper cervical flexion, isometric 1. Lie on your back with a thin pillow behind your head and a small rolled-up towel under your neck. 2. Gently tuck your chin toward your chest and nod your head down to look toward your feet. Do not lift your head off the pillow. 3. Hold for __________ seconds. 4. Release the tension slowly. Relax your neck muscles completely before you repeat this exercise. Repeat __________ times. Complete this exercise __________ times a day. Exercise E: Cervical extension, isometric 1. Stand about 6 inches (15 cm) away from a wall, with your back facing the wall. 2. Place a soft object, about 6-8 inches (15-20  cm) in diameter, between the back of your head and the wall. A soft object could be a small pillow, a ball, or a folded towel. 3. Gently tilt your head back and press into the soft object. Keep your jaw and forehead relaxed. 4. Hold for __________ seconds. 5. Release the tension slowly. Relax your neck muscles completely before you repeat this exercise. Repeat __________ times. Complete this exercise __________ times a day. Posture and body mechanics   Body  mechanics refers to the movements and positions of your body while you do your daily activities. Posture is part of body mechanics. Good posture and healthy body mechanics can help to relieve stress in your body's tissues and joints. Good posture means that your spine is in its natural S-curve position (your spine is neutral), your shoulders are pulled back slightly, and your head is not tipped forward. The following are general guidelines for applying improved posture and body mechanics to your everyday activities. Standing  When standing, keep your spine neutral and keep your feet about hip-width apart. Keep a slight bend in your knees. Your ears, shoulders, and hips should line up.  When you do a task in which you stand in one place for a long time, place one foot up on a stable object that is 2-4 inches (5-10 cm) high, such as a footstool. This helps keep your spine neutral. Sitting  When sitting, keep your spine neutral and your keep feet flat on the floor. Use a footrest, if necessary, and keep your thighs parallel to the floor. Avoid rounding your shoulders, and avoid tilting your head forward.  When working at a desk or a computer, keep your desk at a height where your hands are slightly lower than your elbows. Slide your chair under your desk so you are close enough to maintain good posture.  When working at a computer, place your monitor at a height where you are looking straight ahead and you do not have to tilt your head forward or downward to look at the screen. Resting When lying down and resting, avoid positions that are most painful for you. Try to support your neck in a neutral position. You can use a contour pillow or a small rolled-up towel. Your pillow should support your neck but not push on it. This information is not intended to replace advice given to you by your health care provider. Make sure you discuss any questions you have with your health care provider. Document  Released: 10/14/2005 Document Revised: 06/20/2016 Document Reviewed: 09/20/2015 Elsevier Interactive Patient Education  2017 [employer].

## 2016-11-26 NOTE — Assessment & Plan Note (Addendum)
Suspect related to recent flulike illness. His other symptoms have improved. Discussed monitoring for 1-2 weeks and if not improving contacting us so we can refer to ENT for further evaluation given his history of smoking.

## 2016-11-26 NOTE — Progress Notes (Signed)
Paul Rumps, MD Phone: [phone]  Paul Fields is a 56 y.o. male who presents today for same day visit.  Patient notes right big toe swelling at the first MCP joint. It appears to be a bony deformity. Has been getting larger over the years though started to be painful over the last 2 months. Particularly hurts if he has shoes on. Does get swollen at times. No prior XR.  Hoarseness: Patient notes this been going on for a couple weeks. He had the flu with cough and chest congestion for several days about 3 weeks ago. Those symptoms have improved though he continues to have some hoarseness. He notes no postnasal drip. No reflux symptoms. He notes no other symptoms with the hoarseness.  Patient also notes that he felt as though he strained his back at the end of last week. He pushed on the clutch in his truck and felt a twinge. Notes this has gone away. He notes no radiation, numbness, weakness, loss of bowel or bladder function, saddle anesthesia, or fevers. He does feel as though he pulled a muscle in his right posterior neck as well. Hurts if he turns his neck to the left. Notes this has not improved though has not worsened. He has not been doing anything for this. No radiation down his arms or numbness or weakness.  PMH: Smoker   ROS see history of present illness  Objective  Physical Exam Vitals:   11/26/16 0838  BP: (!) 104/56  Pulse: 72  Resp: 16  Temp: 98 F (36.7 C)    BP Readings from Last 3 Encounters:  11/26/16 (!) 104/56  05/17/16 120/71  04/24/16 (!) 128/56   Wt Readings from Last 3 Encounters:  11/26/16 167 lb (75.8 kg)  05/17/16 169 lb 3.2 oz (76.7 kg)  04/24/16 163 lb 9.6 oz (74.2 kg)    Physical Exam  Constitutional: No distress.  HENT:  Head: Normocephalic and atraumatic.  Mouth/Throat: Oropharynx is clear and moist. No oropharyngeal exudate.  Cardiovascular: Normal rate, regular rhythm and normal heart sounds.   Pulmonary/Chest: Effort normal  and breath sounds normal.  Musculoskeletal:  No midline spine tenderness, no midline spine step-off, no muscular back or muscular neck tenderness, right first MCP joint with bony nodule on the dorsal aspect of the foot at this joint, there is no tenderness, warmth, or erythema of this, 2+ DP pulses bilateral feet   Neurological: He is alert.  5/5 strength in bilateral biceps, triceps, grip, quads, hamstrings, plantar and dorsiflexion, sensation to light touch intact in bilateral UE and LE, normal gait, 2+ patellar reflexes  Skin: He is not diaphoretic.     Assessment/Plan: Please see individual problem list.  Nodule of right lung Patient missed his appointment with thoracic surgery for follow-up. Referral placed back to them.  Foot pain, right Suspect arthritis as cause of nodule and discomfort. Refer to podiatry for evaluation.  Hoarseness of voice Suspect related to recent flulike illness. His other symptoms have improved. Discussed monitoring for 1-2 weeks and if not improving contacting us so we can refer to ENT for further evaluation given his history of smoking.  Neck pain Suspect muscular strain in his neck as cause of discomfort. His back has resolved. He is neurologically intact. Given exercises. NSAIDs for any discomfort. If not improving he'll let us know.   Orders Placed This Encounter  Procedures  . Ambulatory referral to Podiatry    Referral Priority:   Routine    Referral Type:  Consultation    Referral Reason:   Specialty Services Required    Requested Specialty:   Podiatry    Number of Visits Requested:   1  . Ambulatory referral to Cardiothoracic Surgery    Referral Priority:   Routine    Referral Type:   Surgical    Referral Reason:   Specialty Services Required    Requested Specialty:   Cardiothoracic Surgery    Number of Visits Requested:   1    Paul Rumps, MD [place]

## 2016-11-26 NOTE — Progress Notes (Signed)
Pre-visit discussion using our clinic review tool. No additional management support is needed unless otherwise documented below in the visit note.  

## 2016-11-26 NOTE — Assessment & Plan Note (Signed)
Patient missed his appointment with thoracic surgery for follow-up. Referral placed back to them.

## 2016-11-26 NOTE — Assessment & Plan Note (Signed)
Suspect muscular strain in his neck as cause of discomfort. His back has resolved. He is neurologically intact. Given exercises. NSAIDs for any discomfort. If not improving he'll let us know.

## 2016-11-26 NOTE — Assessment & Plan Note (Signed)
Suspect arthritis as cause of nodule and discomfort. Refer to podiatry for evaluation.

## 2016-11-27 ENCOUNTER — Other Ambulatory Visit: Payer: Self-pay | Admitting: Cardiothoracic Surgery

## 2016-11-27 DIAGNOSIS — R918 Other nonspecific abnormal finding of lung field: Secondary | ICD-10-CM

## 2016-11-27 NOTE — Progress Notes (Signed)
Patient was notified about his appointment. He was also told that he needed to be fasting four hours prior to his appointment. Patient was also told that his CT Scan would be done at the [facility] location. He stated that he knew where that was. Patient understood and had no further questions.

## 2016-12-04 ENCOUNTER — Ambulatory Visit
Admission: RE | Admit: 2016-12-04 | Discharge: 2016-12-04 | Disposition: A | Discharge status: Home / Self Care | Payer: BLUE CROSS/BLUE SHIELD | Source: Ambulatory Visit | Attending: Cardiothoracic Surgery | Admitting: Cardiothoracic Surgery

## 2016-12-04 DIAGNOSIS — R918 Other nonspecific abnormal finding of lung field: Secondary | ICD-10-CM | POA: Diagnosis not present

## 2016-12-04 DIAGNOSIS — I700 Atherosclerosis of aorta: Secondary | ICD-10-CM | POA: Insufficient documentation

## 2016-12-04 DIAGNOSIS — J432 Centrilobular emphysema: Secondary | ICD-10-CM | POA: Insufficient documentation

## 2016-12-04 MED ORDER — IOPAMIDOL (ISOVUE-300) INJECTION 61%
75.0000 mL | Freq: Once | INTRAVENOUS | Status: AC | PRN
  Administered 2016-12-04: 75 mL via INTRAVENOUS

## 2016-12-06 ENCOUNTER — Encounter: Payer: Self-pay | Admitting: Cardiothoracic Surgery

## 2016-12-06 ENCOUNTER — Ambulatory Visit (INDEPENDENT_AMBULATORY_CARE_PROVIDER_SITE_OTHER): Payer: BLUE CROSS/BLUE SHIELD | Admitting: Cardiothoracic Surgery

## 2016-12-06 VITALS — BP 125/77 | HR 91 | Temp 98.6°F | Ht 69.0 in | Wt 165.4 lb

## 2016-12-06 DIAGNOSIS — R911 Solitary pulmonary nodule: Secondary | ICD-10-CM

## 2016-12-06 NOTE — Progress Notes (Signed)
  Patient ID: Paul Fields, male   DOB: [birth year], 56 y.o.   MRN: [id number]  HISTORY: He returns today in follow-up. He did have a chest CT scan made to follow-up on his multiple bilateral pulmonary nodules. He states that he has been a little more short of breath and has been coughing a little bit more back to smoking 2 packs cigarettes. He denied any hemoptysis.   Vitals:   12/06/16 1323  BP: 125/77  Pulse: 91  Temp: 98.6 F (37 C)     EXAM:    Resp: Lungs are clear bilaterally.  No respiratory distress, normal effort. Heart:  Regular without murmurs Abd:  Abdomen is soft, non distended and non tender. No masses are palpable.  There is no rebound and no guarding.  Neurological: Alert and oriented to person, place, and time. Coordination normal.  Skin: Skin is warm and dry. No rash noted. No diaphoretic. No erythema. No pallor.  Psychiatric: Normal mood and affect. Normal behavior. Judgment and thought content normal.    ASSESSMENT: I have independently reviewed his chest CTs. We compared them. There is no change in the multiple bilateral pulmonary nodules. There were 2 areas in the right upper lobe that have regressed significantly.   PLAN:   I would like to bring the patient back again in one year with another CT scan the chest. Smoking cessation was presented to him in the past.    Nestor Lewandowsky, MD

## 2016-12-06 NOTE — Patient Instructions (Signed)
I will contact you at the end of December with a CT Scan appointment and then a follow up appointment with Dr. Genevive Bi.

## 2016-12-09 ENCOUNTER — Ambulatory Visit (INDEPENDENT_AMBULATORY_CARE_PROVIDER_SITE_OTHER): Payer: BLUE CROSS/BLUE SHIELD | Admitting: Podiatry

## 2016-12-09 ENCOUNTER — Ambulatory Visit (INDEPENDENT_AMBULATORY_CARE_PROVIDER_SITE_OTHER): Payer: BLUE CROSS/BLUE SHIELD

## 2016-12-09 ENCOUNTER — Encounter: Payer: Self-pay | Admitting: Podiatry

## 2016-12-09 VITALS — BP 113/65 | HR 91 | Resp 16

## 2016-12-09 DIAGNOSIS — M79671 Pain in right foot: Secondary | ICD-10-CM | POA: Diagnosis not present

## 2016-12-09 DIAGNOSIS — M205X1 Other deformities of toe(s) (acquired), right foot: Secondary | ICD-10-CM | POA: Diagnosis not present

## 2016-12-09 NOTE — Patient Instructions (Signed)
Pre-Operative Instructions  Congratulations, you have decided to take an important step to improving your quality of life.  You can be assured that the doctors of [facility] will be with you every step of the way.  1. Plan to be at the surgery center/hospital at least 1 (one) hour prior to your scheduled time unless otherwise directed by the surgical center/hospital staff.  You must have a responsible adult accompany you, remain during the surgery and drive you home.  Make sure you have directions to the surgical center/hospital and know how to get there on time. 2. For hospital based surgery you will need to obtain a history and physical form from your family physician within 1 month prior to the date of surgery- we will give you a form for you primary physician.  3. We make every effort to accommodate the date you request for surgery.  There are however, times where surgery dates or times have to be moved.  We will contact you as soon as possible if a change in schedule is required.   4. No Aspirin/Ibuprofen for one week before surgery.  If you are on aspirin, any non-steroidal anti-inflammatory medications (Mobic, Aleve, Ibuprofen) you should stop taking it 7 days prior to your surgery.  You make take Tylenol  For pain prior to surgery.  5. Medications- If you are taking daily heart and blood pressure medications, seizure, reflux, allergy, asthma, anxiety, pain or diabetes medications, make sure the surgery center/hospital is aware before the day of surgery so they may notify you which medications to take or avoid the day of surgery. 6. No food or drink after midnight the night before surgery unless directed otherwise by surgical center/hospital staff. 7. No alcoholic beverages 24 hours prior to surgery.  No smoking 24 hours prior to or 24 hours after surgery. 8. Wear loose pants or shorts- loose enough to fit over bandages, boots, and casts. 9. No slip on shoes, sneakers are best. 10. Bring  your boot with you to the surgery center/hospital.  Also bring crutches or a walker if your physician has prescribed it for you.  If you do not have this equipment, it will be provided for you after surgery. 11. If you have not been contracted by the surgery center/hospital by the day before your surgery, call to confirm the date and time of your surgery. 12. Leave-time from work may vary depending on the type of surgery you have.  Appropriate arrangements should be made prior to surgery with your employer. 13. Prescriptions will be provided immediately following surgery by your doctor.  Have these filled as soon as possible after surgery and take the medication as directed. 14. Remove nail polish on the operative foot. 15. Wash the night before surgery.  The night before surgery wash the foot and leg well with the antibacterial soap provided and water paying special attention to beneath the toenails and in between the toes.  Rinse thoroughly with water and dry well with a towel.  Perform this wash unless told not to do so by your physician.  Enclosed: 1 Ice pack (please put in freezer the night before surgery)   1 Hibiclens skin cleaner   Pre-op Instructions  If you have any questions regarding the instructions, do not hesitate to call our office.  [place]: [address] [place], [place] [address] [phone]  [place]: [address] [place], [place] [address] [phone]  [place]: [address]  [place], [place] [address] [phone]   Dr.   Norman Regal DPM, Dr. Matthew Wagoner DPM, Dr. M. Todd Hyatt DPM, Dr. Titorya Stover DPM 

## 2016-12-09 NOTE — Progress Notes (Signed)
   Subjective:    Patient ID: Paul Fields, male    DOB: 1961-01-22, 56 y.o.   MRN: [id number]  HPI: He presents today with chief complaint of pain to the first metatarsophalangeal joint of the right foot. States that this deformity is been here for many years but is starting to ache about a year ago and has noticed the inability to perform his daily activities has decreased. 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 He smokes 2 packs a day but has no medical history in general.    Review of Systems  All other systems reviewed and are negative.      Objective:   Physical Exam: Vital signs are stable he is alert and oriented 3. Pulses are strongly palpable. Neurologic sensorium is intact. Deep tendon reflexes are intact. Muscle strength +5 over 5 dorsiflexion plantar flexors and inverters everters on his musculature is intact. Orthopedic evaluation was strength all joints distal to the ankle for range of motion without crepitation. Cutaneous evaluation demonstrates supple well-hydrated cutis no open lesions or wounds. Orthopedic evaluation does demonstrate a limitation on range of motion of the first metatarsophalangeal joint right foot greater than the left there is 0 range of motion at the first metatarsophalangeal joint of the right foot. He does have normal range of motion at the hallux interphalangeal joint. He has a large nonpulsatile osseous mass to the dorsal medial aspect of the first metatarsophalangeal joint was which is exquisitely tender on palpation. Radiographs taken today do demonstrate 3 views of the right foot which is consistent with severe hallux limitus osteoarthritic changes first metatarsophalangeal joint right foot with joint space narrowing and subchondral sclerosis and eburnation and spurring. No fractures are identified.      Assessment & Plan:    Hallux limitus hallux rigidus first metatarsophalangeal joint right foot.  Plan: Discussed etiology pathology conservative versus surgical therapies. At this point I consented him for a Keller arthroplasty with a single silicone implant to the right first metatarsophalangeal joint. We discussed all of the possible options including fusion and osteotomies. We decided on the implant today. We discussed the possible postoperative palpitations which may include but are not limited to postop pain bleeding swelling infection recurrence and need for further surgery overcorrection and under correction loss of digit loss of limb loss of life chronic pain to the first metatarsophalangeal joint and sesamoiditis. He understands this is amenable to assign R3 patient the consent form. We discussed anesthesia and a sciatic block as well as [place] [facility] and dislocation. I will follow-up with him in the near future for surgical intervention and we dispensed a cam walker today for his postop recovery.

## 2016-12-18 ENCOUNTER — Telehealth: Payer: Self-pay | Admitting: *Deleted

## 2016-12-18 NOTE — Telephone Encounter (Signed)
"  I'm calling to schedule my surgery."  Do you have a date in mind?  "It needs to be done in next couple of weeks because I have to get my FMLA stuff squared away."  Dr. Milinda Pointer can do it on 01/10/2017 or 02/07/2017.  "Let's do it on 01/10/2017."  I'll get it scheduled.  You can register now with the surgical center, instructions are in the surgical center brochure.  Someone from the surgical center will call you with an arrival time a day or two prior to surgery date.  "I have a question, is it okay for my wife to bring my FMLA papers to the office to have them filled out?"  Yes, that will be fine.

## 2017-01-08 ENCOUNTER — Other Ambulatory Visit: Payer: Self-pay | Admitting: Podiatry

## 2017-01-08 MED ORDER — HYDROMORPHONE HCL 4 MG PO TABS: 4.0000 mg | ORAL_TABLET | ORAL | 0 refills | Status: DC | PRN

## 2017-01-08 MED ORDER — ONDANSETRON HCL 4 MG PO TABS: 4.0000 mg | ORAL_TABLET | Freq: Three times a day (TID) | ORAL | 0 refills | Status: DC | PRN

## 2017-01-08 MED ORDER — CEPHALEXIN 500 MG PO CAPS: 500.0000 mg | ORAL_CAPSULE | Freq: Three times a day (TID) | ORAL | 0 refills | Status: DC

## 2017-01-09 ENCOUNTER — Telehealth: Payer: Self-pay | Admitting: *Deleted

## 2017-01-09 NOTE — Telephone Encounter (Signed)
"  I'm scheduled for surgery tomorrow at the surgical center.  I just want to make sure I have the correct address.  I'm not a [place]."  You will be going to [address]  The address is on the brochure that was given to you.  "Okay, thank you."

## 2017-01-10 ENCOUNTER — Encounter: Payer: Self-pay | Admitting: Podiatry

## 2017-01-10 DIAGNOSIS — M2011 Hallux valgus (acquired), right foot: Secondary | ICD-10-CM | POA: Diagnosis not present

## 2017-01-10 DIAGNOSIS — M21611 Bunion of right foot: Secondary | ICD-10-CM | POA: Diagnosis not present

## 2017-01-10 DIAGNOSIS — J439 Emphysema, unspecified: Secondary | ICD-10-CM | POA: Diagnosis not present

## 2017-01-10 DIAGNOSIS — M25571 Pain in right ankle and joints of right foot: Secondary | ICD-10-CM | POA: Diagnosis not present

## 2017-01-10 DIAGNOSIS — M205X1 Other deformities of toe(s) (acquired), right foot: Secondary | ICD-10-CM | POA: Diagnosis not present

## 2017-01-15 ENCOUNTER — Encounter: Payer: Self-pay | Admitting: Podiatry

## 2017-01-15 ENCOUNTER — Ambulatory Visit (INDEPENDENT_AMBULATORY_CARE_PROVIDER_SITE_OTHER): Payer: BLUE CROSS/BLUE SHIELD

## 2017-01-15 ENCOUNTER — Ambulatory Visit (INDEPENDENT_AMBULATORY_CARE_PROVIDER_SITE_OTHER): Payer: Self-pay | Admitting: Podiatry

## 2017-01-15 VITALS — BP 141/76 | HR 103 | Resp 16

## 2017-01-15 DIAGNOSIS — M205X1 Other deformities of toe(s) (acquired), right foot: Secondary | ICD-10-CM | POA: Diagnosis not present

## 2017-01-15 NOTE — Progress Notes (Signed)
He presents today 1 week status post Keller arthroplasty single silicone implant right foot. He states it seems to be doing very well though is somewhat tender. He states that the foot may be hanging down little more than it should've been and it feels tight. He is very happy with the way it looks once we undressed today.  Objective: Vital signs are stable he is alert and oriented 3. Pulses are palpable. Dry sterile dressing was removed demonstrate mild erythema over the surgical site no cellulitis drainage or odor. Incision line is still clearly coapted there is no drainage. He has good range of motion first metatarsophalangeal joint.  Radiographic evaluation demonstrates well-healing surgical foot well placed Keller arthroplasty with a single silicone implant right.  Assessment: Well-healing surgical foot 1 week status post Jake Michaelis.  Plan: Redressed today dry sterile compressive dressing encouraged range of motion exercises.

## 2017-01-16 DIAGNOSIS — H2512 Age-related nuclear cataract, left eye: Secondary | ICD-10-CM | POA: Diagnosis not present

## 2017-01-22 ENCOUNTER — Encounter: Payer: Self-pay | Admitting: Podiatry

## 2017-01-22 ENCOUNTER — Ambulatory Visit (INDEPENDENT_AMBULATORY_CARE_PROVIDER_SITE_OTHER): Payer: BLUE CROSS/BLUE SHIELD | Admitting: Podiatry

## 2017-01-22 DIAGNOSIS — M205X1 Other deformities of toe(s) (acquired), right foot: Secondary | ICD-10-CM

## 2017-01-23 NOTE — Progress Notes (Signed)
He presents today nearly 2 weeks status post Keller bunion repair right foot. States it is doing very well he has good range of motion of the first metatarsophalangeal joint and it does not hurt like it did prior to surgery.  Objective: Vital signs are stable he is alert and oriented 3 he has no calf pain he has good range of motion of the first metatarsophalangeal joint of the right foot. Sutures are intact margins well coapted and sutures were removed today margins remain well coapted with no signs of infection no edema no cellulitis drainage or odor.  Assessment: Well-healing surgical foot right.  Plan: We'll allow him to soak in Epsom salts and warm water for the next 2-3 days before he starts showering. We will put him in a compression anklet and a Darco shoe I will follow-up with him in 2 weeks for recheck.

## 2017-01-28 ENCOUNTER — Encounter: Payer: Self-pay | Admitting: Podiatry

## 2017-01-28 ENCOUNTER — Ambulatory Visit (INDEPENDENT_AMBULATORY_CARE_PROVIDER_SITE_OTHER): Payer: BLUE CROSS/BLUE SHIELD | Admitting: Podiatry

## 2017-01-28 ENCOUNTER — Telehealth: Payer: Self-pay | Admitting: *Deleted

## 2017-01-28 DIAGNOSIS — Z9889 Other specified postprocedural states: Secondary | ICD-10-CM

## 2017-01-28 MED ORDER — SULFAMETHOXAZOLE-TRIMETHOPRIM 800-160 MG PO TABS: 1.0000 | ORAL_TABLET | Freq: Two times a day (BID) | ORAL | 1 refills | Status: DC

## 2017-01-28 NOTE — Telephone Encounter (Signed)
Pt states he is having some seepage from the suture line and swelling and asked if that was normal. I informed pt it was not normal and I would like to get him in to see Dr. Amalia Hailey in the [place] office today. Transferred pt to schedulers.

## 2017-01-30 NOTE — Progress Notes (Signed)
Subjective: Patient presents today status post  Bunionectomy repair by Dr. Milinda Pointer  To the right foot. Date of surgery 01/08/2017. Patient presents today for concerns about drainage from the incision site. Patient has noticed purulent drainage for the past 2-3 days.   Objective: Today there does not appear to be a significant amount of drainage. Minimal serosanguineous drainage noted from the incision site. Moderate pain to touch. Localized cellulitis noted around the incision site.  Assessment: Status post bunionectomy repair right foot. Date of surgery 01/08/2017  With possible localized cellulitis   plan of care: Today the patient was evaluated. Dressings were changed. Prescription for Bactrim DS. Return to clinic in 2 weeks for follow-up evaluation with Dr. Milinda Pointer.  Edrick Kins, DPM [facility]  Dr. Edrick Kins, [address]                                        [place], [place] [address]                Office [phone]  Fax [phone]

## 2017-02-05 ENCOUNTER — Ambulatory Visit (INDEPENDENT_AMBULATORY_CARE_PROVIDER_SITE_OTHER): Payer: BLUE CROSS/BLUE SHIELD | Admitting: Podiatry

## 2017-02-05 ENCOUNTER — Encounter: Payer: Self-pay | Admitting: Podiatry

## 2017-02-05 ENCOUNTER — Ambulatory Visit: Payer: BLUE CROSS/BLUE SHIELD

## 2017-02-05 DIAGNOSIS — M205X1 Other deformities of toe(s) (acquired), right foot: Secondary | ICD-10-CM

## 2017-02-05 NOTE — Progress Notes (Signed)
He presents today for follow-up of his Jake Michaelis arthroplasty with single silicone implant. Date of surgery 01/10/2017. He sparsely 3 weeks out at this point. He was taking antibiotics provided by Dr. Amalia Hailey last week for a superficial wound infection. He states that he is doing much better denies fever chills nausea vomiting muscle H pains. No calf pain.  Vital signs are stable he is alert and oriented 3 pulses are palpable. Dry sterile dressing was removed demonstrates well-healing surgical site very small area of dehiscence most distally. He has a good range of motion of the first metatarsophalangeal joint.  Assessment: Well-healing surgical foot slightly delayed due to distal dehiscence. Slightly delayed due to superficial infection. Infection has resolved.  Plan: Cover with a dry sterile compressive dressing today and instructed him not to get this wet will follow-up with him in 1 week prior to his departure for the [facility].

## 2017-02-11 ENCOUNTER — Encounter: Payer: Self-pay | Admitting: Podiatry

## 2017-02-11 ENCOUNTER — Ambulatory Visit (INDEPENDENT_AMBULATORY_CARE_PROVIDER_SITE_OTHER): Payer: BLUE CROSS/BLUE SHIELD | Admitting: Podiatry

## 2017-02-11 DIAGNOSIS — Z9889 Other specified postprocedural states: Secondary | ICD-10-CM

## 2017-02-11 DIAGNOSIS — M205X1 Other deformities of toe(s) (acquired), right foot: Secondary | ICD-10-CM

## 2017-02-12 ENCOUNTER — Telehealth: Payer: Self-pay | Admitting: Family Medicine

## 2017-02-12 DIAGNOSIS — N529 Male erectile dysfunction, unspecified: Secondary | ICD-10-CM

## 2017-02-12 NOTE — Telephone Encounter (Signed)
Referral placed.

## 2017-02-12 NOTE — Progress Notes (Signed)
Subjective: Patient presents today status post bunionectomy repair by Dr. Milinda Pointer to the right foot. Date of surgery 01/10/17. He reports some throbbing pain while lying down. He states his toes hurt when he walks  Objective: Today there does not appear to be a significant amount of drainage. Minimal serosanguineous drainage noted from the incision site. Moderate pain to touch. Localized cellulitis noted around the incision site  Assessment: Status post Keller arthroplasty of the right foot. Date of surgery 01/08/2017.  Plan of care: Today the patient was evaluated. Debrided incisional eschar. Antibiotics and Band-Aid daily. Return to clinic in 2 weeks with Dr. Milinda Pointer

## 2017-02-12 NOTE — Telephone Encounter (Signed)
Pt lvm requesting a referral to urology for ED. Please advise

## 2017-02-26 ENCOUNTER — Encounter: Payer: Self-pay | Admitting: Podiatry

## 2017-02-26 ENCOUNTER — Ambulatory Visit (INDEPENDENT_AMBULATORY_CARE_PROVIDER_SITE_OTHER): Payer: BLUE CROSS/BLUE SHIELD

## 2017-02-26 ENCOUNTER — Ambulatory Visit (INDEPENDENT_AMBULATORY_CARE_PROVIDER_SITE_OTHER): Payer: BLUE CROSS/BLUE SHIELD | Admitting: Podiatry

## 2017-02-26 DIAGNOSIS — M205X1 Other deformities of toe(s) (acquired), right foot: Secondary | ICD-10-CM

## 2017-02-27 NOTE — Progress Notes (Signed)
He presents today for follow-up of his Keller arthroplasty single silicone implant right foot. Date of surgery was 01/10/2017. He states that he is doing much better as he refers to not only the motion of the joint but also the wound healing to the dorsal aspect of the foot. Denies fever chills nausea vomiting muscle aches and pains denies chest pain shortness of breath.  Objective: Pulses are palpable. Open wound dorsal aspect of the foot overlying the extensor hallucis longus is granulating in and epithelialization is occurring. He will be some time before this is durable skin. But he has great range of motion of the first metatarsophalangeal joint without crepitation and without pain. He is very excited about the ability to walk.  Assessment: These were redressed the foot daily I'm allow him back into regular tennis shoe but he's not start work yet because of the type shoe gear he needs to wear. I also want this wound healed and I will follow-up with him in 2 weeks we may need to consider him out of work for another month.

## 2017-03-03 ENCOUNTER — Encounter: Payer: BLUE CROSS/BLUE SHIELD | Admitting: Podiatry

## 2017-03-05 ENCOUNTER — Encounter: Payer: Self-pay | Admitting: Podiatry

## 2017-03-06 ENCOUNTER — Ambulatory Visit: Payer: BLUE CROSS/BLUE SHIELD | Admitting: Urology

## 2017-03-12 ENCOUNTER — Encounter: Payer: Self-pay | Admitting: Podiatry

## 2017-03-12 ENCOUNTER — Ambulatory Visit (INDEPENDENT_AMBULATORY_CARE_PROVIDER_SITE_OTHER): Payer: BLUE CROSS/BLUE SHIELD

## 2017-03-12 ENCOUNTER — Ambulatory Visit (INDEPENDENT_AMBULATORY_CARE_PROVIDER_SITE_OTHER): Payer: BLUE CROSS/BLUE SHIELD | Admitting: Podiatry

## 2017-03-12 DIAGNOSIS — M205X1 Other deformities of toe(s) (acquired), right foot: Secondary | ICD-10-CM

## 2017-03-12 NOTE — Progress Notes (Signed)
He presents today status post Keller arthroplasty first metatarsophalangeal joint right foot. He states that he is doing very well and will be going back to work Monday.  Objective: Vital signs are stable he's alert and oriented 3 has great range of motion of the first metatarsophalangeal joint without symptoms. Radius demonstrated complete arthroplasty using silicone implant healing well.  Assessment: Well-healed surgical foot right.  Plan: Increase activities and follow up with me in 6 weeks I will let him go back to work Monday.

## 2017-03-18 NOTE — Progress Notes (Signed)
DOS 01/10/2017 Paul Fields arthroplasty with silicone implant 1st MPJ right foot

## 2017-04-15 DIAGNOSIS — R6882 Decreased libido: Secondary | ICD-10-CM | POA: Diagnosis not present

## 2017-04-15 DIAGNOSIS — N5201 Erectile dysfunction due to arterial insufficiency: Secondary | ICD-10-CM | POA: Diagnosis not present

## 2017-04-15 DIAGNOSIS — F524 Premature ejaculation: Secondary | ICD-10-CM | POA: Diagnosis not present

## 2017-04-16 DIAGNOSIS — N5201 Erectile dysfunction due to arterial insufficiency: Secondary | ICD-10-CM | POA: Diagnosis not present

## 2017-04-28 ENCOUNTER — Ambulatory Visit: Payer: BLUE CROSS/BLUE SHIELD | Admitting: Podiatry

## 2017-05-19 ENCOUNTER — Ambulatory Visit (INDEPENDENT_AMBULATORY_CARE_PROVIDER_SITE_OTHER): Payer: BLUE CROSS/BLUE SHIELD

## 2017-05-19 ENCOUNTER — Ambulatory Visit (INDEPENDENT_AMBULATORY_CARE_PROVIDER_SITE_OTHER): Payer: BLUE CROSS/BLUE SHIELD | Admitting: Podiatry

## 2017-05-19 ENCOUNTER — Encounter: Payer: Self-pay | Admitting: Podiatry

## 2017-05-19 DIAGNOSIS — M205X1 Other deformities of toe(s) (acquired), right foot: Secondary | ICD-10-CM

## 2017-05-19 NOTE — Progress Notes (Signed)
He presents today for his final postop visit date of surgery 01/10/2017 he states it is doing just fine have no problems with it.  Objective: Vital signs are stable he is alert and oriented 3. Great range of motion of the first metatarsophalangeal joint most of the scar tissue is going to resolve radiographs taken today do demonstrate well-healing Keller arthroplasty of the single silicone implants on soft tissue edema or scarring is noted dorsal aspect of the foot overlying the joint and first metatarsals.  Assessment: Well-healed surgical foot status post Paul Fields with implant right.  Plan: Encourage range of motion activity about it and get back to his regular activities. Follow up with me as needed.

## 2017-09-08 ENCOUNTER — Telehealth: Payer: Self-pay | Admitting: Cardiothoracic Surgery

## 2017-09-08 DIAGNOSIS — R911 Solitary pulmonary nodule: Secondary | ICD-10-CM

## 2017-09-08 DIAGNOSIS — IMO0001 Reserved for inherently not codable concepts without codable children: Secondary | ICD-10-CM

## 2017-09-08 NOTE — Telephone Encounter (Signed)
Patient is calling said he saw Dr. Genevive Bi back in February and said he needed to have another  CT scan done before the end of the year. Please call patient and advice.

## 2017-09-09 NOTE — Telephone Encounter (Signed)
Called patient back and had to leave him a detailed message letting him know that his CT Scan and follow up appointment by mail.

## 2017-10-03 ENCOUNTER — Ambulatory Visit: Payer: BLUE CROSS/BLUE SHIELD

## 2017-10-03 ENCOUNTER — Telehealth: Payer: Self-pay

## 2017-10-03 NOTE — Telephone Encounter (Signed)
Patient called stating that his CT Scan is scheduled for 10/15/2017. I told him that Dr. Genevive Bi did not have any appointments left for this year. However, Dr. Genevive Bi is able to see after the beginning of the year to go over his CT Scan results. Patient needs CT Scan by 10/27/2017 since he had met his deductible. I told him that it was okay to see him until 11/07/2017. Patient agreed. I also told patient that I would mail him the appointments information. Patient had no further questions.

## 2017-10-03 NOTE — Telephone Encounter (Signed)
I tried calling patient and his phone number is no longer in service. I then called his wife, Lattie Haw and I was able to speak with her. I told her that her husband had a scheduled CT Scan for today and he did not show up. I told her that I could give her the number to central scheduling for them to reschedule before 10/10/2017. She stated that she would call them to reschedule.

## 2017-10-10 ENCOUNTER — Ambulatory Visit: Payer: Self-pay | Admitting: Cardiothoracic Surgery

## 2017-10-15 ENCOUNTER — Ambulatory Visit: Payer: BLUE CROSS/BLUE SHIELD

## 2017-10-15 ENCOUNTER — Ambulatory Visit
Admission: RE | Admit: 2017-10-15 | Discharge: 2017-10-15 | Disposition: A | Discharge status: Home / Self Care | Payer: BLUE CROSS/BLUE SHIELD | Source: Ambulatory Visit | Attending: Cardiothoracic Surgery | Admitting: Cardiothoracic Surgery

## 2017-10-15 DIAGNOSIS — I700 Atherosclerosis of aorta: Secondary | ICD-10-CM | POA: Insufficient documentation

## 2017-10-15 DIAGNOSIS — R911 Solitary pulmonary nodule: Secondary | ICD-10-CM | POA: Insufficient documentation

## 2017-10-15 DIAGNOSIS — J439 Emphysema, unspecified: Secondary | ICD-10-CM | POA: Diagnosis not present

## 2017-10-15 DIAGNOSIS — IMO0001 Reserved for inherently not codable concepts without codable children: Secondary | ICD-10-CM

## 2017-10-15 DIAGNOSIS — R918 Other nonspecific abnormal finding of lung field: Secondary | ICD-10-CM | POA: Diagnosis not present

## 2017-10-16 ENCOUNTER — Telehealth: Payer: Self-pay

## 2017-10-16 NOTE — Telephone Encounter (Signed)
Called patient and had to leave him a voicemail to return my call. 

## 2017-10-16 NOTE — Telephone Encounter (Signed)
Patient called back and I was able to give him his CT Scan results. I also told him the importance of coming in to see Dr. Genevive Bi on 11/07/2017. Patient understood and had no further questions.

## 2017-10-17 ENCOUNTER — Ambulatory Visit: Payer: Self-pay | Admitting: Cardiothoracic Surgery

## 2017-11-07 ENCOUNTER — Telehealth: Payer: Self-pay | Admitting: Family Medicine

## 2017-11-07 ENCOUNTER — Encounter: Payer: Self-pay | Admitting: Cardiothoracic Surgery

## 2017-11-07 ENCOUNTER — Ambulatory Visit: Payer: BLUE CROSS/BLUE SHIELD | Admitting: Cardiothoracic Surgery

## 2017-11-07 VITALS — BP 131/83 | HR 67 | Temp 98.1°F | Wt 167.0 lb

## 2017-11-07 DIAGNOSIS — R911 Solitary pulmonary nodule: Secondary | ICD-10-CM | POA: Diagnosis not present

## 2017-11-07 NOTE — Telephone Encounter (Signed)
Pt just came from Dr. Genevive Bi office. Dr. Genevive Bi suggested that pt quit smoking. Pt would like to be prescribed Chantix. Please send prescription  To [facility].  Thanks

## 2017-11-07 NOTE — Patient Instructions (Signed)
We will call you with your CT Scan and Dr. Genevive Bi appointments.  Please give [place] a call in case you have any questions or concerns.

## 2017-11-07 NOTE — Progress Notes (Signed)
  Patient ID: Paul Fields, male   DOB: 11/01/1960, 57 y.o.   MRN: [id number]  HISTORY: He returns today in follow-up.  He states that he has had a cough recently and thinks that he might have a cold.  Otherwise he has been getting along pretty well.  He does continue to smoke unfortunately.  He does not appear to have much interest in quitting.   Vitals:   11/07/17 0850  BP: 131/83  Pulse: 67  Temp: 98.1 F (36.7 C)     EXAM:    Resp: Lungs are clear bilaterally.  No respiratory distress, normal effort. Heart:  Regular without murmurs Abd:  Abdomen is soft, non distended and non tender. No masses are palpable.  There is no rebound and no guarding.  Neurological: Alert and oriented to person, place, and time. Coordination normal.  Skin: Skin is warm and dry. No rash noted. No diaphoretic. No erythema. No pallor.  Psychiatric: Normal mood and affect. Normal behavior. Judgment and thought content normal.    ASSESSMENT: I have independently reviewed the patient's CT scan.  There is no change in the lung nodules which measure about 7 mm overall.  I did explain to him that we would need to continue to follow these for a minimum of 2 years.   PLAN:   We will bring him back again for repeat CT scan without contrast at the two-year mark.  All of his questions were answered.    Nestor Lewandowsky, MD

## 2017-11-07 NOTE — Telephone Encounter (Signed)
Please advise 

## 2017-11-08 NOTE — Telephone Encounter (Signed)
Please see how much the patient is currently smoking. Please see how long he has smoked for. Please also confirm that he has a history of depression and anxiety. Based on his chart he does have this history. Chantix does have the potential to worsen psychiatric issues and he needs to be aware of this. Its been close to a year since he was seen. He will need a follow-up scheduled as well. Thanks.

## 2017-11-10 NOTE — Telephone Encounter (Signed)
Patient states he has smoked for 40 years and smokes an average of 2 pack a day. Patient states he has taken chantix before and it worked. Patient states he saw the psychiatrist when he had a lot going on, he states all of his grand children were living with him but they have all moved out now and he has been doing a lot better.

## 2017-11-10 NOTE — Telephone Encounter (Signed)
Please advise 

## 2017-11-11 MED ORDER — VARENICLINE TARTRATE 0.5 MG X 11 & 1 MG X 42 PO MISC: ORAL | 0 refills | Status: DC

## 2017-11-11 NOTE — Telephone Encounter (Signed)
Please fax.  Please get the patient set up with an office visit for around a month.  If he has any adverse effects such as odd dreams, anxiety, depression, or other symptoms he needs to be evaluated.  Thanks.

## 2017-11-12 NOTE — Telephone Encounter (Signed)
Patient notified

## 2017-12-24 ENCOUNTER — Ambulatory Visit: Payer: BLUE CROSS/BLUE SHIELD | Admitting: Family Medicine

## 2018-01-14 ENCOUNTER — Ambulatory Visit: Payer: BLUE CROSS/BLUE SHIELD | Admitting: Family Medicine

## 2018-01-14 ENCOUNTER — Other Ambulatory Visit: Payer: Self-pay

## 2018-01-14 ENCOUNTER — Encounter: Payer: Self-pay | Admitting: Family Medicine

## 2018-01-14 VITALS — BP 112/70 | HR 67 | Temp 97.8°F | Wt 165.2 lb

## 2018-01-14 DIAGNOSIS — Z720 Tobacco use: Secondary | ICD-10-CM

## 2018-01-14 DIAGNOSIS — Z13220 Encounter for screening for lipoid disorders: Secondary | ICD-10-CM

## 2018-01-14 DIAGNOSIS — J432 Centrilobular emphysema: Secondary | ICD-10-CM | POA: Diagnosis not present

## 2018-01-14 DIAGNOSIS — L819 Disorder of pigmentation, unspecified: Secondary | ICD-10-CM | POA: Insufficient documentation

## 2018-01-14 DIAGNOSIS — E559 Vitamin D deficiency, unspecified: Secondary | ICD-10-CM | POA: Diagnosis not present

## 2018-01-14 DIAGNOSIS — R911 Solitary pulmonary nodule: Secondary | ICD-10-CM

## 2018-01-14 MED ORDER — VARENICLINE TARTRATE 1 MG PO TABS: 1.0000 mg | ORAL_TABLET | Freq: Two times a day (BID) | ORAL | 1 refills | Status: DC

## 2018-01-14 NOTE — Assessment & Plan Note (Signed)
Appears to be a hemangioma.  Also has scattered hyperpigmented macules on his back.  Will refer to dermatology for further evaluation.

## 2018-01-14 NOTE — Progress Notes (Signed)
Tommi Rumps, MD Phone: [phone]  Paul Fields is a 57 y.o. male who presents today for follow-up.  Tobacco abuse: Was smoking 3 packs/day.  Now down to 1.5 packs/day.  Has been using Chantix with no side effects.  No anxiety or depression.  No abnormal dreams.  He follows with thoracic surgery for a lung nodule.  Recent CT scan revealed stability.  He notes he has had chronic coughing and breathing issues just in the morning right after he gets up.  Resolves after he coughs the mucus up.  He is quite active at work with moving [employer] and changing things up and has no issues throughout the day doing that.  He does note one time last week there is a small amount of cough production that tasted a little like blood in his wife notes he coughed up a blood-streaked amount of mucus.  He has had no fevers.  No chest pain.  He feels at his baseline.  He has not advised his surgeon regarding this.  He reports skin lesion on his right anterior shoulder that has been there over the last year.  Seems consistent with a hemangioma given appearance.  Also has numerous macules over his back.  Has not seen a dermatologist previously.  Social History   Tobacco Use  Smoking Status Current Every Day Smoker  . Packs/day: 2.00  . Years: 38.00  . Pack years: 76.00  . Types: Cigarettes  . Last attempt to quit: 04/16/2016  . Years since quitting: 1.7  Smokeless Tobacco Never Used  Tobacco Comment   tobacco info given 08/11/12     ROS see history of present illness  Objective  Physical Exam Vitals:   01/14/18 1530  BP: 112/70  Pulse: 67  Temp: 97.8 F (36.6 C)  SpO2: 96%    BP Readings from Last 3 Encounters:  01/14/18 112/70  11/07/17 131/83  01/15/17 (!) 141/76   Wt Readings from Last 3 Encounters:  01/14/18 165 lb 3.2 oz (74.9 kg)  11/07/17 167 lb (75.8 kg)  12/06/16 165 lb 6.4 oz (75 kg)    Physical Exam  Constitutional: No distress.  Cardiovascular: Normal rate, regular  rhythm and normal heart sounds.  Pulmonary/Chest: Effort normal and breath sounds normal.  Musculoskeletal: He exhibits no edema.  Neurological: He is alert. Gait normal.  Skin: Skin is warm and dry. He is not diaphoretic.        Assessment/Plan: Please see individual problem list.  Tobacco use Continue Chantix.  Congratulated on decreasing tobacco use.  He will continue to try to quit.  If he has side effects he will let us know.  Nodule of right lung He is following with surgery for this.  It has been stable.  He has chronic cough with productive mucus.  Did have a small amount of blood last week though this has not recurred.  No new symptoms otherwise. Potentially related to irritation from coughing. We will fax my note to the surgeon's office so that they are aware.  The patient's wife will also call them to make them aware as well. If recurs he will be evaluated.  He is given return precautions.  Centrilobular emphysema ([place]) Related to him smoking.  Seen on CT scan.  He does have fairly significant cough that is productive in the morning with some breathing issues while coughing.  He notes no exertional symptoms throughout the day and notes all his symptoms resolve after he coughs everything up.  We will  get him set up for PFTs.  Consider referral to pulmonology.   Hyperpigmented skin lesion Appears to be a hemangioma.  Also has scattered hyperpigmented macules on his back.  Will refer to dermatology for further evaluation.   Orders Placed This Encounter  Procedures  . Lipid panel    Standing Status:   Future    Standing Expiration Date:   01/15/2019  . Comp Met (CMET)    Standing Status:   Future    Standing Expiration Date:   01/15/2019  . Vitamin D (25 hydroxy)    Standing Status:   Future    Standing Expiration Date:   01/15/2019  . Ambulatory referral to Dermatology    Referral Priority:   Routine    Referral Type:   Consultation    Referral Reason:   Specialty Services  Required    Requested Specialty:   Dermatology    Number of Visits Requested:   1  . Pulmonary Function Test [facility] Only    Standing Status:   Future    Standing Expiration Date:   01/15/2019    Order Specific Question:   Full PFT: includes the following: basic spirometry, spirometry pre & post bronchodilator, diffusion capacity (DLCO), lung volumes    Answer:   Full PFT    Order Specific Question:   Methacholine challenge    Answer:   Yes    Order Specific Question:   This test can only be performed at    Answer:   [facility] ordered this encounter  Medications  . varenicline (CHANTIX CONTINUING MONTH PAK) 1 MG tablet    Sig: Take 1 tablet (1 mg total) by mouth 2 (two) times daily.    Dispense:  60 tablet    Refill:  1   He will return for fasting labs.   Tommi Rumps, MD [place]

## 2018-01-14 NOTE — Assessment & Plan Note (Addendum)
He is following with surgery for this.  It has been stable.  He has chronic cough with productive mucus.  Did have a small amount of blood last week though this has not recurred.  No new symptoms otherwise. Potentially related to irritation from coughing. We will fax my note to the surgeon's office so that they are aware.  The patient's wife will also call them to make them aware as well. If recurs he will be evaluated.  He is given return precautions.

## 2018-01-14 NOTE — Assessment & Plan Note (Signed)
Continue Chantix.  Congratulated on decreasing tobacco use.  He will continue to try to quit.  If he has side effects he will let us know.

## 2018-01-14 NOTE — Assessment & Plan Note (Signed)
Related to him smoking.  Seen on CT scan.  He does have fairly significant cough that is productive in the morning with some breathing issues while coughing.  He notes no exertional symptoms throughout the day and notes all his symptoms resolve after he coughs everything up.  We will get him set up for PFTs.  Consider referral to pulmonology.

## 2018-01-14 NOTE — Patient Instructions (Signed)
Nice to see you. We refilled your Chantix. We will fax your note to Dr. Genevive Bi office.  Please call them and let them know about your coughing up a very small amount of blood. Will get lung function testing and have you see a dermatologist. If you cough up more blood or you develop any trouble breathing or chest pain please be evaluated immediately.

## 2018-01-15 NOTE — Progress Notes (Signed)
faxed

## 2018-01-23 ENCOUNTER — Other Ambulatory Visit (INDEPENDENT_AMBULATORY_CARE_PROVIDER_SITE_OTHER): Payer: BLUE CROSS/BLUE SHIELD

## 2018-01-23 DIAGNOSIS — E559 Vitamin D deficiency, unspecified: Secondary | ICD-10-CM

## 2018-01-23 DIAGNOSIS — Z13220 Encounter for screening for lipoid disorders: Secondary | ICD-10-CM

## 2018-01-23 LAB — COMPREHENSIVE METABOLIC PANEL
ALBUMIN: 4.2 g/dL (ref 3.5–5.2)
ALK PHOS: 52 U/L (ref 39–117)
ALT: 13 U/L (ref 0–53)
AST: 12 U/L (ref 0–37)
BILIRUBIN TOTAL: 0.3 mg/dL (ref 0.2–1.2)
BUN: 16 mg/dL (ref 6–23)
CO2: 31 mEq/L (ref 19–32)
CREATININE: 1.22 mg/dL (ref 0.40–1.50)
Calcium: 9.2 mg/dL (ref 8.4–10.5)
Chloride: 102 mEq/L (ref 96–112)
GFR: 64.71 mL/min (ref >60.00)
Glucose, Bld: 99 mg/dL (ref 70–99)
POTASSIUM: 4.9 meq/L (ref 3.5–5.1)
SODIUM: 139 meq/L (ref 135–145)
TOTAL PROTEIN: 6.6 g/dL (ref 6.0–8.3)

## 2018-01-23 LAB — LIPID PANEL
CHOLESTEROL: 167 mg/dL (ref 0–200)
HDL: 54.7 mg/dL (ref >39.00)
LDL Cholesterol: 100 mg/dL — ABNORMAL HIGH (ref 0–99)
NonHDL: 112
Total CHOL/HDL Ratio: 3
Triglycerides: 61 mg/dL (ref 0.0–149.0)
VLDL: 12.2 mg/dL (ref 0.0–40.0)

## 2018-01-23 LAB — VITAMIN D 25 HYDROXY (VIT D DEFICIENCY, FRACTURES): VITD: 24.17 ng/mL — ABNORMAL LOW (ref 30.00–100.00)

## 2018-01-29 ENCOUNTER — Other Ambulatory Visit: Payer: Self-pay | Admitting: Family Medicine

## 2018-01-29 DIAGNOSIS — E785 Hyperlipidemia, unspecified: Secondary | ICD-10-CM

## 2018-01-29 MED ORDER — ROSUVASTATIN CALCIUM 20 MG PO TABS: 20.0000 mg | ORAL_TABLET | Freq: Every day | ORAL | 3 refills | Status: DC

## 2018-02-03 ENCOUNTER — Ambulatory Visit: Payer: BLUE CROSS/BLUE SHIELD | Attending: Family Medicine

## 2018-02-03 DIAGNOSIS — J432 Centrilobular emphysema: Secondary | ICD-10-CM

## 2018-02-03 MED ORDER — ALBUTEROL SULFATE (2.5 MG/3ML) 0.083% IN NEBU
2.5000 mg | INHALATION_SOLUTION | Freq: Once | RESPIRATORY_TRACT | Status: AC
  Administered 2018-02-03: 2.5 mg via RESPIRATORY_TRACT
  Filled 2018-02-03: qty 3

## 2018-02-05 ENCOUNTER — Telehealth: Payer: Self-pay | Admitting: Family Medicine

## 2018-02-05 DIAGNOSIS — J439 Emphysema, unspecified: Secondary | ICD-10-CM

## 2018-02-05 NOTE — Telephone Encounter (Signed)
Patient notified of his results. He would like to go forward with the referral.  Copied from [place] [id number]. Topic: Quick Communication - Lab Results >> Feb 05, 2018  3:11 PM Leeanne Rio, CMA wrote: Notes recorded by Leeanne Rio, CMA on 02/05/2018 at 3:11 PM EDT [id number] for [facility] to give results ------  Notes recorded by [name], MD on 02/05/2018 at 2:57 PM EDT Please let the patient know that his spirometry revealed obstructive airway disease. I would suggest that we have him see pulmonology to consider potential treatment and monitoring. If he is willing I can place a referral. Thanks.

## 2018-02-05 NOTE — Telephone Encounter (Signed)
Patient is requesting a referral for pulmonology.

## 2018-02-05 NOTE — Telephone Encounter (Signed)
Referral placed.

## 2018-02-05 NOTE — Addendum Note (Signed)
Addended by: Caryl Bis, ERIC G on: 02/05/2018 04:00 PM   Modules accepted: Orders

## 2018-02-06 DIAGNOSIS — D229 Melanocytic nevi, unspecified: Secondary | ICD-10-CM | POA: Diagnosis not present

## 2018-02-06 DIAGNOSIS — L814 Other melanin hyperpigmentation: Secondary | ICD-10-CM | POA: Diagnosis not present

## 2018-02-06 DIAGNOSIS — D1801 Hemangioma of skin and subcutaneous tissue: Secondary | ICD-10-CM | POA: Diagnosis not present

## 2018-02-06 DIAGNOSIS — D239 Other benign neoplasm of skin, unspecified: Secondary | ICD-10-CM | POA: Diagnosis not present

## 2018-02-11 NOTE — Progress Notes (Signed)
[facility] Consultation      Assessment and Plan:  Emphysema/COPD.  Dyspnea on exertion. -Emphysematous changes seen on most recent imaging. -We will check pulmonary function test. - Has some symptoms of nighttime wheezing, will start Spiriva Respimat 2 puffs once daily, is given a coupon. -Check alpha-1 test today.  Nicotine abuse. - Smoked up to 2 packs/day until 3 days ago.  Is currently on Chantix. -Discussed the beneficial effects of smoking cessation, spent greater than 3 minutes in discussion.  GERD. -Symptomatic, may be contributing to nighttime wheezing symptoms. - Asked to take over-the-counter Prilosec 20 mg once daily.  Lung nodule. -Small 7 mm nodule, continue to follow-up with Dr. Genevive Bi for surveillance CT scanning.  COPD Preventive care: Pneumovax- Prevnar- Lung cancer screening- Alpha-1-  Orders Placed This Encounter  Procedures  . Pulmonary Function Test [facility] Only   Meds ordered this encounter  Medications  . Tiotropium Bromide Monohydrate (SPIRIVA RESPIMAT) 2.5 MCG/ACT AERS    Sig: Inhale 2 puffs into the lungs daily.    Dispense:  1 Inhaler    Refill:  5  . omeprazole (PRILOSEC) 20 MG capsule    Sig: Take 1 capsule (20 mg total) by mouth daily.    Dispense:  30 capsule    Refill:  6   Return in about 3 months (around 05/14/2018).   Date: 02/12/2018  MRN# [id number] Paul Fields November 18, 1960    Paul Fields is a 57 y.o. old male seen in consultation for chief complaint of:    Chief Complaint  Patient presents with  . Consult    ref by Caryl Bis: SOB w/activity: prod cough: wheezing    HPI:   The patient is a smoker with COPD/emphysema.  He is currently being followed for a nodule as well. He last smoked about 3 days ago, he smoked 2-3 ppd. He has been coughing a lot and wheezing a lot at night. He notes that it is worse the past few weeks, he had been wheezing at night.  He is not limited by breathing is  occasionally dyspneic. He works Designer, industrial/product, loading and unloading, very physical work.  He went to see his doctor, he underwent a lung function test which was abnormal. He was then sent here.  He takes no inhalers currently.   He does have reflux, on most mornings, goes away on its own, he takes nothing for it.  He also sinus drainage in the morning, and also coughs a lot in the morning.   He has a dog, not in bedroom.  He snores a bit at night, no witness apneas.   Imaging personally reviewed, CT chest 10/15/17; bilateral emphysema, worst in the apices.  Tiny 7 mm nodule, nodule is being followed by Dr. Genevive Bi.   PMHX:   Past Medical History:  Diagnosis Date  . Clavicle fracture 1986  . COPD (chronic obstructive pulmonary disease) ([place])   . Diverticulosis   . Elbow fracture, right 1968  . Emphysema lung ([place])   . Hemorrhoid   . Polyp of colon, adenomatous    possible based on genetic testing   Surgical Hx:  Past Surgical History:  Procedure Laterality Date  . COLONOSCOPY W/ BIOPSIES  04/2015   polyps-benign  . ELBOW FRACTURE SURGERY Right 1967   Pins, then hardware removal  . ELBOW HARDWARE REMOVAL Right 1969   right  . polyps  2013   20 polyps biopsies as tubular adenomas  . [place]  Family Hx:  Family History  Problem Relation Age of Onset  . COPD Father   . Arthritis Father   . Parkinsonism Mother   . Diabetes Mother        borderline  . Lung cancer Mother   . Cancer Mother        Lung  . Colon cancer Maternal Uncle        diagnosed in his 39s  . Alzheimer's disease Paternal Aunt   . COPD Paternal Uncle   . Lung cancer Maternal Grandfather        Ecologist  . Prostate cancer Maternal Uncle        diagnosed in his 70s  . Alzheimer's disease Paternal Aunt   . Heart attack Paternal Uncle   . Brain cancer Paternal Uncle 69  . Lymphoma Cousin        paternal cousin  . COPD Brother        prostate  . Cancer Paternal Grandfather         lung  . Heart disease Neg Hx   . Stomach cancer Neg Hx   . Rectal cancer Neg Hx    Social Hx:   Social History   Tobacco Use  . Smoking status: Current Every Day Smoker    Packs/day: 2.00    Years: 38.00    Pack years: 76.00    Types: Cigarettes    Last attempt to quit: 04/16/2016    Years since quitting: 1.8  . Smokeless tobacco: Never Used  . Tobacco comment: tobacco info given 08/11/12  Substance Use Topics  . Alcohol use: No    Comment: History of 6 pk beer on weekends- Last [facility] 11/2015  . Drug use: No   Medication:    Current Outpatient Medications:  .  rosuvastatin (CRESTOR) 20 MG tablet, Take 1 tablet (20 mg total) by mouth daily., Disp: 90 tablet, Rfl: 3 .  varenicline (CHANTIX CONTINUING MONTH PAK) 1 MG tablet, Take 1 tablet (1 mg total) by mouth 2 (two) times daily., Disp: 60 tablet, Rfl: 1   Allergies:  Patient has no known allergies.  Review of Systems: Gen:  Denies  fever, sweats, chills HEENT: Denies blurred vision, double vision. bleeds, sore throat Cvc:  No dizziness, chest pain. Resp:   Denies cough or sputum production, shortness of breath Gi: Denies swallowing difficulty, stomach pain. Gu:  Denies bladder incontinence, burning urine Ext:   No Joint pain, stiffness. Skin: No skin rash,  hives  Endoc:  No polyuria, polydipsia. Psych: No depression, insomnia. Other:  All other systems were reviewed with the patient and were negative other that what is mentioned in the HPI.   Physical Examination:   VS: BP 108/60 (BP Location: Left Arm, Cuff Size: Normal)   Pulse 79   Ht 5\' 9"  (1.753 m)   Wt 165 lb (74.8 kg)   SpO2 96%   BMI 24.37 kg/m   General Appearance: No distress  Neuro:without focal findings,  speech normal,  HEENT: PERRLA, EOM intact.   Pulmonary: normal breath sounds, No wheezing.  Decreased air entry bilaterally. CardiovascularNormal S1,S2.  No m/r/g.   Abdomen: Benign, Soft, non-tender. Renal:  No costovertebral tenderness    GU:  No performed at this time. Endoc: No evident thyromegaly, no signs of acromegaly. Skin:   warm, no rashes, no ecchymosis  Extremities: normal, no cyanosis, clubbing.  Other findings:    LABORATORY PANEL:   CBC No results for input(s): WBC, HGB, HCT, PLT in  the last 168 hours. ------------------------------------------------------------------------------------------------------------------  Chemistries  No results for input(s): NA, K, CL, CO2, GLUCOSE, BUN, CREATININE, CALCIUM, MG, AST, ALT, ALKPHOS, BILITOT in the last 168 hours.  Invalid input(s): GFRCGP ------------------------------------------------------------------------------------------------------------------  Cardiac Enzymes No results for input(s): TROPONINI in the last 168 hours. ------------------------------------------------------------  RADIOLOGY:  No results found.     Thank  you for the consultation and for allowing [facility] to assist in the care of your patient. Our recommendations are noted above.  Please contact us if we can be of further service.   Marda Stalker, MD.  Board Certified in Internal Medicine, Pulmonary Medicine, [place], and Sleep Medicine.   Pulmonary and Critical Care Office Number: [phone]  Patricia Pesa, M.D.  Merton Border, M.D  02/12/2018

## 2018-02-12 ENCOUNTER — Encounter: Payer: Self-pay | Admitting: Internal Medicine

## 2018-02-12 ENCOUNTER — Ambulatory Visit: Payer: BLUE CROSS/BLUE SHIELD | Admitting: Internal Medicine

## 2018-02-12 VITALS — BP 108/60 | HR 79 | Ht 69.0 in | Wt 165.0 lb

## 2018-02-12 DIAGNOSIS — J449 Chronic obstructive pulmonary disease, unspecified: Secondary | ICD-10-CM | POA: Diagnosis not present

## 2018-02-12 DIAGNOSIS — F17210 Nicotine dependence, cigarettes, uncomplicated: Secondary | ICD-10-CM | POA: Diagnosis not present

## 2018-02-12 MED ORDER — OMEPRAZOLE 20 MG PO CPDR: 20.0000 mg | DELAYED_RELEASE_CAPSULE | Freq: Every day | ORAL | 6 refills | Status: DC

## 2018-02-12 MED ORDER — TIOTROPIUM BROMIDE MONOHYDRATE 2.5 MCG/ACT IN AERS: 2.0000 | INHALATION_SPRAY | Freq: Every day | RESPIRATORY_TRACT | 5 refills | Status: DC

## 2018-02-12 NOTE — Patient Instructions (Addendum)
Start omeprazole (prilosec) 20 mg once daily.   Start spiriva respimat 2 puff once per day.   --Quitting smoking is the most important thing that you can do for your health.  --Quitting smoking will have greater affect on your health than any medicine that we can give you.

## 2018-02-23 ENCOUNTER — Telehealth: Payer: Self-pay | Admitting: *Deleted

## 2018-02-23 NOTE — Telephone Encounter (Signed)
Alpha-1 test was insufficient. Copy placed in MD folder to notifiy.

## 2018-02-24 ENCOUNTER — Telehealth: Payer: Self-pay

## 2018-02-24 NOTE — Telephone Encounter (Signed)
Copied from [facility] [id number]. Topic: Inquiry >> Feb 23, 2018  5:43 PM Oliver Pila B wrote: Reason for CRM: pt returned Jessica's call, call pt if needed

## 2018-02-24 NOTE — Telephone Encounter (Signed)
Patient had already been notified of results from [place] but not documented under result note

## 2018-02-28 ENCOUNTER — Other Ambulatory Visit: Payer: Self-pay | Admitting: Family Medicine

## 2018-03-02 ENCOUNTER — Other Ambulatory Visit (INDEPENDENT_AMBULATORY_CARE_PROVIDER_SITE_OTHER): Payer: BLUE CROSS/BLUE SHIELD

## 2018-03-02 DIAGNOSIS — E785 Hyperlipidemia, unspecified: Secondary | ICD-10-CM

## 2018-03-02 LAB — HEPATIC FUNCTION PANEL
ALK PHOS: 52 U/L (ref 39–117)
ALT: 17 U/L (ref 0–53)
AST: 15 U/L (ref 0–37)
Albumin: 4.4 g/dL (ref 3.5–5.2)
BILIRUBIN DIRECT: 0.1 mg/dL (ref 0.0–0.3)
TOTAL PROTEIN: 6.7 g/dL (ref 6.0–8.3)
Total Bilirubin: 0.4 mg/dL (ref 0.2–1.2)

## 2018-03-02 LAB — LDL CHOLESTEROL, DIRECT: Direct LDL: 33 mg/dL

## 2018-03-02 NOTE — Telephone Encounter (Signed)
Last OV 01/14/18 last filled 01/14/18  60 1rf

## 2018-03-02 NOTE — Telephone Encounter (Signed)
Please check to see if the patient has quit smoking and see if he has been taking the Chantix.  Typically it is a 12-week course and if he has continued to smoke we would discontinue after 12 weeks.  If he has quit smoking we could continue it further for another 12 weeks to maintain smoking cessation.  Thanks.

## 2018-03-03 NOTE — Telephone Encounter (Signed)
Left message to return call, ok for pec to verify if the patient has quit smoking, if not then we will need to discontinue the chantix. If he has quit smoking then we can refill the chantix

## 2018-03-16 ENCOUNTER — Encounter: Payer: Self-pay | Admitting: Internal Medicine

## 2018-04-21 ENCOUNTER — Ambulatory Visit: Payer: BLUE CROSS/BLUE SHIELD | Admitting: Family Medicine

## 2018-04-24 ENCOUNTER — Ambulatory Visit: Payer: BLUE CROSS/BLUE SHIELD | Admitting: Family Medicine

## 2018-04-24 ENCOUNTER — Other Ambulatory Visit: Payer: Self-pay

## 2018-04-24 ENCOUNTER — Encounter: Payer: Self-pay | Admitting: Family Medicine

## 2018-04-24 DIAGNOSIS — J432 Centrilobular emphysema: Secondary | ICD-10-CM

## 2018-04-24 DIAGNOSIS — K219 Gastro-esophageal reflux disease without esophagitis: Secondary | ICD-10-CM | POA: Diagnosis not present

## 2018-04-24 DIAGNOSIS — Z1211 Encounter for screening for malignant neoplasm of colon: Secondary | ICD-10-CM

## 2018-04-24 DIAGNOSIS — Z720 Tobacco use: Secondary | ICD-10-CM | POA: Diagnosis not present

## 2018-04-24 MED ORDER — VARENICLINE TARTRATE 0.5 MG X 11 & 1 MG X 42 PO MISC: ORAL | 0 refills | Status: DC

## 2018-04-24 NOTE — Assessment & Plan Note (Addendum)
Reinforced he needs to quit smoking.  He wants to try Chantix again.  Starter month pack prescribed.  He will monitor for any depression or dreams and if those occur stop the medication and let us know.

## 2018-04-24 NOTE — Patient Instructions (Signed)
Nice to see you. Please quit smoking again. If you have worsening reflux symptoms please let us know.

## 2018-04-24 NOTE — Assessment & Plan Note (Addendum)
Occasional GERD symptoms.  Sore throat from last week could have been related to this or viral illness.  He will monitor with occasional omeprazole use.  If worsening he will let us know.

## 2018-04-24 NOTE — Assessment & Plan Note (Signed)
Patient reports he just received a reminder that he is due for this.  He will contact his GI physician to set this up.

## 2018-04-24 NOTE — Progress Notes (Signed)
Tommi Rumps, MD Phone: [phone]  Paul Fields is a 57 y.o. male who presents today for f/u.  CC: copd, gerd, hld  HYPERLIPIDEMIA Symptoms Chest pain on exertion:  no    Medications: Compliance- taking crestor Right upper quadrant pain- no  Muscle aches- no  COPD: Medication compliance- taking spiriva Dyspnea- no  Wheezing- one occasion  Cough-minimal since going back to smoking   GERD:   Reflux symptoms: occasional burning   Abd pain: no   Blood in stool: no  Dysphagia: no    Medication: occasional prilosec Patient reports he had a sore throat for a couple of days last week.  Felt like a burning sensation.  May have been reflux related.  Resolved on its own.  He is back to smoking about a pack per day.  He quit for 2 months with the Chantix.  He has no depression.  No odd dreams on the Chantix.   Social History   Tobacco Use  Smoking Status Current Every Day Smoker  . Packs/day: 2.00  . Years: 38.00  . Pack years: 76.00  . Types: Cigarettes  . Last attempt to quit: 04/16/2016  . Years since quitting: 2.0  Smokeless Tobacco Never Used  Tobacco Comment   tobacco info given 08/11/12     ROS see history of present illness  Objective  Physical Exam Vitals:   04/24/18 1606  BP: 116/72  Pulse: 74  Temp: 98 F (36.7 C)  SpO2: 96%    BP Readings from Last 3 Encounters:  04/24/18 116/72  02/12/18 108/60  01/14/18 112/70   Wt Readings from Last 3 Encounters:  04/24/18 168 lb 3.2 oz (76.3 kg)  02/12/18 165 lb (74.8 kg)  01/14/18 165 lb 3.2 oz (74.9 kg)    Physical Exam  Constitutional: No distress.  Cardiovascular: Normal rate, regular rhythm and normal heart sounds.  Pulmonary/Chest: Effort normal and breath sounds normal.  Abdominal: Soft. Bowel sounds are normal. He exhibits no distension. There is no tenderness.  Musculoskeletal: He exhibits no edema.  Neurological: He is alert.  Skin: Skin is warm and dry. He is not diaphoretic.      Assessment/Plan: Please see individual problem list.  Centrilobular emphysema ([place]) Symptomatically doing well on Spiriva.  He will continue this.  Screening for colon cancer Patient reports he just received a reminder that he is due for this.  He will contact his GI physician to set this up.  Tobacco use Reinforced he needs to quit smoking.  He wants to try Chantix again.  Starter month pack prescribed.  He will monitor for any depression or dreams and if those occur stop the medication and let us know.  GERD (gastroesophageal reflux disease) Occasional GERD symptoms.  Sore throat from last week could have been related to this or viral illness.  He will monitor with occasional omeprazole use.  If worsening he will let us know.   No orders of the defined types were placed in this encounter.   Meds ordered this encounter  Medications  . varenicline (CHANTIX STARTING MONTH PAK) 0.5 MG X 11 & 1 MG X 42 tablet    Sig: Take one 0.5 mg tablet by mouth once daily for 3 days, then increase to one 0.5 mg tablet twice daily for 4 days, then increase to one 1 mg tablet twice daily.    Dispense:  53 tablet    Refill:  0     Tommi Rumps, MD [facility]  

## 2018-04-24 NOTE — Assessment & Plan Note (Signed)
Symptomatically doing well on Spiriva.  He will continue this.

## 2018-05-05 ENCOUNTER — Ambulatory Visit: Payer: BLUE CROSS/BLUE SHIELD | Attending: Internal Medicine

## 2018-05-05 DIAGNOSIS — J449 Chronic obstructive pulmonary disease, unspecified: Secondary | ICD-10-CM | POA: Diagnosis not present

## 2018-05-05 MED ORDER — ALBUTEROL SULFATE (2.5 MG/3ML) 0.083% IN NEBU
2.5000 mg | INHALATION_SOLUTION | Freq: Once | RESPIRATORY_TRACT | Status: AC
  Administered 2018-05-05: 2.5 mg via RESPIRATORY_TRACT
  Filled 2018-05-05: qty 3

## 2018-05-14 ENCOUNTER — Ambulatory Visit: Payer: BLUE CROSS/BLUE SHIELD | Admitting: Internal Medicine

## 2018-05-18 ENCOUNTER — Encounter: Payer: Self-pay | Admitting: Internal Medicine

## 2018-05-18 ENCOUNTER — Ambulatory Visit: Payer: BLUE CROSS/BLUE SHIELD | Admitting: Internal Medicine

## 2018-05-18 VITALS — BP 124/80 | HR 88 | Ht 69.0 in | Wt 167.0 lb

## 2018-05-18 DIAGNOSIS — J449 Chronic obstructive pulmonary disease, unspecified: Secondary | ICD-10-CM | POA: Diagnosis not present

## 2018-05-18 DIAGNOSIS — F17210 Nicotine dependence, cigarettes, uncomplicated: Secondary | ICD-10-CM

## 2018-05-18 MED ORDER — ALBUTEROL SULFATE HFA 108 (90 BASE) MCG/ACT IN AERS: 1.0000 | INHALATION_SPRAY | Freq: Four times a day (QID) | RESPIRATORY_TRACT | 5 refills | Status: DC | PRN

## 2018-05-18 NOTE — Progress Notes (Signed)
[facility] Consultation      Assessment and Plan:  Emphysema/COPD.  Dyspnea on exertion. -Emphysematous changes seen on most recent imaging. -We will check pulmonary function test. - Has some symptoms of nighttime wheezing, will start Spiriva Respimat 2 puffs once daily, is given a coupon. -Check alpha-1 test today.  Nicotine abuse. - Smoked up to 2 packs/day, quit for 2 months, recently restarted. -Again discussed the beneficial effects of smoking cessation, spent greater than 3 minutes in discussion.  GERD. -Symptomatic, may be contributing to nighttime wheezing symptoms. - Asked to take over-the-counter Prilosec 20 mg once daily.  Lung nodule. -Small 7 mm nodule, continue to follow-up with Dr. Genevive Bi for surveillance CT scanning.  COPD Preventive care: Pneumovax- Prevnar- Lung cancer screening- Already following with Dr. Genevive Bi.  Alpha-1- Sent, bad test. No need for rescreening.    Meds ordered this encounter  Medications  . albuterol (PROAIR HFA) 108 (90 Base) MCG/ACT inhaler    Sig: Inhale 1-2 puffs into the lungs every 6 (six) hours as needed for wheezing or shortness of breath.    Dispense:  1 Inhaler    Refill:  5   Return in about 1 year (around 05/19/2019).   Date: 05/18/2018  MRN# [id number] CORRIE BRANNEN 07-11-1961    Paul Fields is a 57 y.o. old male seen in consultation for chief complaint of:    Chief Complaint  Patient presents with  . Follow-up    PFT results: SOB w/activity: cough at times    HPI:  The patient is a 57 year old male smoker with COPD, dyspnea, nighttime wheezing.  Last visit she was started on Spiriva Respimat, sent for PFT, alpha-1 test (insufficient), and counseled on smoking cessation. He quit smoking with chantix for 2 month, but restarted again due to stress. He has more chantix at home and is considering trying again.  He is using spiriva once daily, it has been helping with his breathing he is not  coughing as much. The wheezing at night is better, now occurs occasionally. He does not have a rescue inhaler.    He does have reflux, on most mornings, goes away on its own, he takes nothing for it.  He also sinus drainage in the morning, and also coughs a lot in the morning.   He has a dog, not in bedroom.  He snores a bit at night, no witness apneas.    **PFT 05/05/2018>> FVC is 88% predicted, FEV1 of 76% predicted, ratio 65% flow volume loop is mildly obstructed.  There is no reversibility with bronchodilator. TLC is 121% predicted, RV/TLC ratio is normal.  DLCO is normal at 94%. - Overall this test shows mild COPD. **CT chest 10/15/17>> bilateral emphysema, worst in the apices.  Tiny 7 mm nodule, nodule is being followed by Dr. Genevive Bi.    Social Hx:   Social History   Tobacco Use  . Smoking status: Current Every Day Smoker    Packs/day: 2.00    Years: 38.00    Pack years: 76.00    Types: Cigarettes    Last attempt to quit: 04/16/2016    Years since quitting: 2.0  . Smokeless tobacco: Never Used  . Tobacco comment: tobacco info given 08/11/12  Substance Use Topics  . Alcohol use: No    Comment: History of 6 pk beer on weekends- Last [facility] 11/2015  . Drug use: No   Medication:    Current Outpatient Medications:  .  omeprazole (PRILOSEC) 20 MG capsule,  Take 1 capsule (20 mg total) by mouth daily., Disp: 30 capsule, Rfl: 6 .  rosuvastatin (CRESTOR) 20 MG tablet, Take 1 tablet (20 mg total) by mouth daily., Disp: 90 tablet, Rfl: 3 .  Tiotropium Bromide Monohydrate (SPIRIVA RESPIMAT) 2.5 MCG/ACT AERS, Inhale 2 puffs into the lungs daily., Disp: 1 Inhaler, Rfl: 5 .  varenicline (CHANTIX STARTING MONTH PAK) 0.5 MG X 11 & 1 MG X 42 tablet, Take one 0.5 mg tablet by mouth once daily for 3 days, then increase to one 0.5 mg tablet twice daily for 4 days, then increase to one 1 mg tablet twice daily., Disp: 53 tablet, Rfl: 0   Allergies:  Patient has no known allergies.  Review of  Systems:  Constitutional: Feels well. Cardiovascular: No chest pain.  Pulmonary: Denies dyspnea.   The remainder of systems were reviewed and were found to be negative other than what is documented in the HPI.    Physical Examination:   VS: BP 124/80 (BP Location: Left Arm, Cuff Size: Normal)   Pulse 88   Ht 5\' 9"  (1.753 m)   Wt 167 lb (75.8 kg)   SpO2 97%   BMI 24.66 kg/m   General Appearance: No distress  Neuro:without focal findings, mental status, speech normal, alert and oriented HEENT: PERRLA, EOM intact Pulmonary: No wheezing, No rales  CardiovascularNormal S1,S2.  No m/r/g.  Abdomen: Benign, Soft, non-tender, No masses Renal:  No costovertebral tenderness  GU:  No performed at this time. Endoc: No evident thyromegaly, no signs of acromegaly or Cushing features Skin:   warm, no rashes, no ecchymosis  Extremities: normal, no cyanosis, clubbing.      LABORATORY PANEL:   CBC No results for input(s): WBC, HGB, HCT, PLT in the last 168 hours. ------------------------------------------------------------------------------------------------------------------  Chemistries  No results for input(s): NA, K, CL, CO2, GLUCOSE, BUN, CREATININE, CALCIUM, MG, AST, ALT, ALKPHOS, BILITOT in the last 168 hours.  Invalid input(s): GFRCGP ------------------------------------------------------------------------------------------------------------------  Cardiac Enzymes No results for input(s): TROPONINI in the last 168 hours. ------------------------------------------------------------  RADIOLOGY:  No results found.     Thank  you for the consultation and for allowing [facility] to assist in the care of your patient. Our recommendations are noted above.  Please contact us if we can be of further service.   Marda Stalker, M.D., F.C.C.P.  Board Certified in Internal Medicine, Pulmonary Medicine, [place], and Sleep Medicine.    [facility] Office Number: [phone]  05/18/2018

## 2018-05-18 NOTE — Patient Instructions (Addendum)
Continue the spiriva daily. Will start rescue inhaler (albuterol) 1-2 puffs as needed for wheezing or trouble breathing.   --Quitting smoking is the most important thing that you can do for your health.  --Quitting smoking will have greater affect on your health than any medicine that we can give you.

## 2018-07-16 DIAGNOSIS — H02052 Trichiasis without entropian right lower eyelid: Secondary | ICD-10-CM | POA: Diagnosis not present

## 2018-07-27 ENCOUNTER — Ambulatory Visit: Payer: BLUE CROSS/BLUE SHIELD | Admitting: Internal Medicine

## 2018-07-27 NOTE — Progress Notes (Deleted)
[facility] Consultation      Assessment and Plan:  Emphysema/COPD. -Emphysematous changes seen on most recent imaging. -We will check pulmonary function test. - Has some symptoms of nighttime wheezing, will start Spiriva Respimat 2 puffs once daily, is given a coupon. -Check alpha-1 test today.  Dyspnea on exertion.  Nicotine abuse. - Smoked up to 2 packs/day, quit for 2 months, recently restarted. -Again discussed the beneficial effects of smoking cessation, spent greater than 3 minutes in discussion.  GERD. -Symptomatic, may be contributing to nighttime wheezing symptoms. - Asked to take over-the-counter Prilosec 20 mg once daily.  Lung nodule. -Small 7 mm nodule, continue to follow-up with Dr. Genevive Bi for surveillance CT scanning.  COPD Preventive care: Pneumovax- Prevnar- Lung cancer screening- Already following with Dr. Genevive Bi.  Alpha-1- Sent, bad test. No need for rescreening.    No orders of the defined types were placed in this encounter.  No follow-ups on file.   Date: 07/27/2018  MRN# [id number] Paul Fields [birth year]    Paul Fields is a 57 y.o. old male seen in consultation for chief complaint of:    No chief complaint on file.   HPI:  The patient is a 57 year old male smoker with COPD, dyspnea, nighttime wheezing.  Last visit she was started on Spiriva Respimat, sent for PFT, alpha-1 test (insufficient), and counseled on smoking cessation. He quit smoking with chantix for 2 month, but restarted again due to stress. He has more chantix at home and is considering trying again.  He is using spiriva once daily, it has been helping with his breathing he is not coughing as much. The wheezing at night is better, now occurs occasionally. He does not have a rescue inhaler.    He does have reflux, on most mornings, goes away on its own, he takes nothing for it.  He also sinus drainage in the morning, and also coughs a lot in the  morning.   He has a dog, not in bedroom.  He snores a bit at night, no witness apneas.    **PFT 05/05/2018>> FVC is 88% predicted, FEV1 of 76% predicted, ratio 65% flow volume loop is mildly obstructed.  There is no reversibility with bronchodilator. TLC is 121% predicted, RV/TLC ratio is normal.  DLCO is normal at 94%. - Overall this test shows mild COPD. **CT chest 10/15/17>> bilateral emphysema, worst in the apices.  Tiny 7 mm nodule, nodule is being followed by Dr. Genevive Bi.    Social Hx:   Social History   Tobacco Use  . Smoking status: Current Every Day Smoker    Packs/day: 2.00    Years: 38.00    Pack years: 76.00    Types: Cigarettes    Last attempt to quit: 04/16/2016    Years since quitting: 2.2  . Smokeless tobacco: Never Used  . Tobacco comment: tobacco info given 08/11/12  Substance Use Topics  . Alcohol use: No    Comment: History of 6 pk beer on weekends- Last [facility] 11/2015  . Drug use: No   Medication:    Current Outpatient Medications:  .  albuterol (PROAIR HFA) 108 (90 Base) MCG/ACT inhaler, Inhale 1-2 puffs into the lungs every 6 (six) hours as needed for wheezing or shortness of breath., Disp: 1 Inhaler, Rfl: 5 .  omeprazole (PRILOSEC) 20 MG capsule, Take 1 capsule (20 mg total) by mouth daily., Disp: 30 capsule, Rfl: 6 .  rosuvastatin (CRESTOR) 20 MG tablet, Take 1 tablet (20 mg  total) by mouth daily., Disp: 90 tablet, Rfl: 3 .  Tiotropium Bromide Monohydrate (SPIRIVA RESPIMAT) 2.5 MCG/ACT AERS, Inhale 2 puffs into the lungs daily., Disp: 1 Inhaler, Rfl: 5 .  varenicline (CHANTIX STARTING MONTH PAK) 0.5 MG X 11 & 1 MG X 42 tablet, Take one 0.5 mg tablet by mouth once daily for 3 days, then increase to one 0.5 mg tablet twice daily for 4 days, then increase to one 1 mg tablet twice daily., Disp: 53 tablet, Rfl: 0   Allergies:  Patient has no known allergies.         LABORATORY PANEL:   CBC No results for input(s): WBC, HGB, HCT, PLT in the last 168  hours. ------------------------------------------------------------------------------------------------------------------  Chemistries  No results for input(s): NA, K, CL, CO2, GLUCOSE, BUN, CREATININE, CALCIUM, MG, AST, ALT, ALKPHOS, BILITOT in the last 168 hours.  Invalid input(s): GFRCGP ------------------------------------------------------------------------------------------------------------------  Cardiac Enzymes No results for input(s): TROPONINI in the last 168 hours. ------------------------------------------------------------  RADIOLOGY:  No results found.     Thank  you for the consultation and for allowing [facility] to assist in the care of your patient. Our recommendations are noted above.  Please contact us if we can be of further service.   Marda Stalker, M.D., F.C.C.P.  Board Certified in Internal Medicine, Pulmonary Medicine, [place], and Sleep Medicine.  [facility] Office Number: [phone]  07/27/2018

## 2018-08-27 ENCOUNTER — Other Ambulatory Visit: Payer: Self-pay

## 2018-08-27 DIAGNOSIS — R911 Solitary pulmonary nodule: Secondary | ICD-10-CM

## 2018-09-17 ENCOUNTER — Telehealth: Payer: Self-pay

## 2018-09-17 DIAGNOSIS — Z1211 Encounter for screening for malignant neoplasm of colon: Secondary | ICD-10-CM

## 2018-09-17 NOTE — Telephone Encounter (Signed)
Patients wife advised referral placed

## 2018-09-17 NOTE — Telephone Encounter (Signed)
Copied from [facility] [id number]. Topic: General - Other >> Sep 17, 2018  3:16 PM Keene Breath wrote: Reason for CRM: Patient called to request a referral for a colonoscopy.  Patient would like to be referred to Dr.Purdle.  CB# [phone]

## 2018-09-17 NOTE — Telephone Encounter (Signed)
Referral placed.

## 2018-09-29 ENCOUNTER — Encounter: Payer: Self-pay | Admitting: Internal Medicine

## 2018-10-03 ENCOUNTER — Telehealth: Payer: Self-pay

## 2018-10-03 NOTE — Telephone Encounter (Signed)
Call pt regarding lung screening. Left message. 

## 2018-10-19 ENCOUNTER — Ambulatory Visit
Admission: RE | Admit: 2018-10-19 | Discharge: 2018-10-19 | Disposition: A | Discharge status: Home / Self Care | Payer: BLUE CROSS/BLUE SHIELD | Source: Ambulatory Visit | Attending: Cardiothoracic Surgery | Admitting: Cardiothoracic Surgery

## 2018-10-19 DIAGNOSIS — R911 Solitary pulmonary nodule: Secondary | ICD-10-CM | POA: Diagnosis not present

## 2018-10-30 ENCOUNTER — Ambulatory Visit (INDEPENDENT_AMBULATORY_CARE_PROVIDER_SITE_OTHER): Payer: BLUE CROSS/BLUE SHIELD | Admitting: Cardiothoracic Surgery

## 2018-10-30 ENCOUNTER — Ambulatory Visit (AMBULATORY_SURGERY_CENTER): Payer: Self-pay | Admitting: *Deleted

## 2018-10-30 ENCOUNTER — Encounter: Payer: Self-pay | Admitting: Cardiothoracic Surgery

## 2018-10-30 ENCOUNTER — Encounter: Payer: Self-pay | Admitting: Internal Medicine

## 2018-10-30 ENCOUNTER — Other Ambulatory Visit: Payer: Self-pay

## 2018-10-30 VITALS — Ht 70.0 in | Wt 167.0 lb

## 2018-10-30 VITALS — BP 136/86 | HR 74 | Temp 97.5°F | Resp 16 | Ht 70.0 in | Wt 165.6 lb

## 2018-10-30 DIAGNOSIS — R911 Solitary pulmonary nodule: Secondary | ICD-10-CM

## 2018-10-30 DIAGNOSIS — Z86010 Personal history of colonic polyps: Secondary | ICD-10-CM

## 2018-10-30 MED ORDER — NA SULFATE-K SULFATE-MG SULF 17.5-3.13-1.6 GM/177ML PO SOLN: 1.0000 | Freq: Once | ORAL | 0 refills | Status: AC

## 2018-10-30 NOTE — Progress Notes (Signed)
  Patient ID: Paul Fields, male   DOB: [birth year], 58 y.o.   MRN: [id number]  HISTORY: He returns today in follow-up.  We have been following him for multiple bilateral pulmonary nodules.  He has no complaints.  He states that he is able to do his activities of daily living without any significant shortness of breath.  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  He smokes anywhere between 1-1/2 to 2 packs cigarettes per day.  He states he is tried to quit but was been unable to.  He denies any weight loss, cough or fever   Vitals:   10/30/18 0814  BP: 136/86  Pulse: 74  Resp: 16  Temp: (!) 97.5 F (36.4 C)  SpO2: 95%     EXAM:    Resp: Lungs are clear bilaterally.  No respiratory distress, normal effort. Heart:  Regular without murmurs Abd:  Abdomen is soft, non distended and non tender. No masses are palpable.  There is no rebound and no guarding.  Neurological: Alert and oriented to person, place, and time. Coordination normal.  Skin: Skin is warm and dry. No rash noted. No diaphoretic. No erythema. No pallor.  Psychiatric: Normal mood and affect. Normal behavior. Judgment and thought content normal.    ASSESSMENT: Multiple bilateral pulmonary nodules.   PLAN:   I have independently reviewed the patient's CT scan and compared it to his prior scans.  There are several nodules in the lungs all of which may have some malignant potential.  Because of this we would recommend that he come back in 1 year for another CT scan of the chest.  He is agreeable to doing so.    Nestor Lewandowsky, MD

## 2018-10-30 NOTE — Progress Notes (Signed)
No egg or soy allergy known to patient  No issues with past sedation with any surgeries  or procedures, no intubation problems  No diet pills per patient No home 02 use per patient  No blood thinners per patient  Pt denies issues with constipation  No A fib or A flutter  EMMI video sent to pt's e mail  - pt declined  Suprep $15 coupon to pt

## 2018-10-30 NOTE — Patient Instructions (Addendum)
Please return in one year to have a CT scan without contrast and follow up with Dr.Oaks to discuss the results.

## 2018-11-13 ENCOUNTER — Encounter: Payer: Self-pay | Admitting: Internal Medicine

## 2018-11-13 ENCOUNTER — Ambulatory Visit (AMBULATORY_SURGERY_CENTER): Payer: BLUE CROSS/BLUE SHIELD | Admitting: Internal Medicine

## 2018-11-13 VITALS — BP 128/91 | HR 65 | Temp 98.6°F | Resp 16

## 2018-11-13 DIAGNOSIS — K635 Polyp of colon: Secondary | ICD-10-CM

## 2018-11-13 DIAGNOSIS — Z86010 Personal history of colonic polyps: Secondary | ICD-10-CM | POA: Diagnosis not present

## 2018-11-13 DIAGNOSIS — D125 Benign neoplasm of sigmoid colon: Secondary | ICD-10-CM

## 2018-11-13 DIAGNOSIS — Z1211 Encounter for screening for malignant neoplasm of colon: Secondary | ICD-10-CM | POA: Diagnosis not present

## 2018-11-13 MED ORDER — SODIUM CHLORIDE 0.9 % IV SOLN: 500.0000 mL | Freq: Once | INTRAVENOUS | Status: DC

## 2018-11-13 NOTE — Op Note (Signed)
[facility] Patient Name: Paul Fields Procedure Date: 11/13/2018 10:49 AM MRN: [id number] Endoscopist: Jerene Bears , MD Age: 58 Referring MD:  Date of Birth: 04-12-1961 Gender: Male Account #: [id number] Procedure:                Colonoscopy Indications:              High risk colon cancer surveillance: Personal                            history of multiple (3 or more) adenomas, Last                            colonoscopy 3 years ago Medicines:                Monitored Anesthesia Care Procedure:                Pre-Anesthesia Assessment:                           - Prior to the procedure, a History and Physical                            was performed, and patient medications and                            allergies were reviewed. The patient's tolerance of                            previous anesthesia was also reviewed. The risks                            and benefits of the procedure and the sedation                            options and risks were discussed with the patient.                            All questions were answered, and informed consent                            was obtained. Prior Anticoagulants: The patient has                            taken no previous anticoagulant or antiplatelet                            agents. ASA Grade Assessment: II - A patient with                            mild systemic disease. After reviewing the risks                            and benefits, the patient was deemed in  satisfactory condition to undergo the procedure.                           After obtaining informed consent, the colonoscope                            was passed under direct vision. Throughout the                            procedure, the patient's blood pressure, pulse, and                            oxygen saturations were monitored continuously. The                            Colonoscope was introduced through the anus  and                            advanced to the cecum, identified by appendiceal                            orifice and ileocecal valve. The colonoscopy was                            performed without difficulty. The patient tolerated                            the procedure well. The quality of the bowel                            preparation was good. The ileocecal valve,                            appendiceal orifice, and rectum were photographed. Scope In: 10:53:36 AM Scope Out: 11:12:06 AM Scope Withdrawal Time: 0 hours 16 minutes 24 seconds  Total Procedure Duration: 0 hours 18 minutes 30 seconds  Findings:                 The perianal and digital rectal examinations were                            normal.                           Five sessile polyps were found in the sigmoid                            colon. The polyps were 3 to 6 mm in size. These                            polyps were removed with a cold snare. Resection                            and retrieval were complete.  Internal hemorrhoids were found during                            retroflexion. The hemorrhoids were small.                           The exam was otherwise without abnormality. Complications:            No immediate complications. Estimated Blood Loss:     Estimated blood loss was minimal. Impression:               - Five 3 to 6 mm polyps in the sigmoid colon,                            removed with a cold snare. Resected and retrieved.                           - Internal hemorrhoids.                           - The examination was otherwise normal. Recommendation:           - Patient has a contact number available for                            emergencies. The signs and symptoms of potential                            delayed complications were discussed with the                            patient. Return to normal activities tomorrow.                            Written  discharge instructions were provided to the                            patient.                           - Resume previous diet.                           - Continue present medications.                           - Await pathology results.                           - Repeat colonoscopy is recommended for                            surveillance. The colonoscopy date will be                            determined after pathology results from today's  exam become available for review. Jerene Bears, MD 11/13/2018 11:17:20 AM This report has been signed electronically.

## 2018-11-13 NOTE — Progress Notes (Signed)
Report to PACU, RN, vss, BBS= Clear.  

## 2018-11-13 NOTE — Progress Notes (Signed)
Called to room to assist during endoscopic procedure.  Patient ID and intended procedure confirmed with present staff. Received instructions for my participation in the procedure from the performing physician.  

## 2018-11-13 NOTE — Patient Instructions (Signed)
Discharge instructions given. Handouts on polyps and Hemorrhoids. Resume previous medications. YOU HAD AN ENDOSCOPIC PROCEDURE TODAY AT [facility]:   Refer to the procedure report that was given to you for any specific questions about what was found during the examination.  If the procedure report does not answer your questions, please call your gastroenterologist to clarify.  If you requested that your care partner not be given the details of your procedure findings, then the procedure report has been included in a sealed envelope for you to review at your convenience later.  YOU SHOULD EXPECT: Some feelings of bloating in the abdomen. Passage of more gas than usual.  Walking can help get rid of the air that was put into your GI tract during the procedure and reduce the bloating. If you had a lower endoscopy (such as a colonoscopy or flexible sigmoidoscopy) you may notice spotting of blood in your stool or on the toilet paper. If you underwent a bowel prep for your procedure, you may not have a normal bowel movement for a few days.  Please Note:  You might notice some irritation and congestion in your nose or some drainage.  This is from the oxygen used during your procedure.  There is no need for concern and it should clear up in a day or so.  SYMPTOMS TO REPORT IMMEDIATELY:   Following lower endoscopy (colonoscopy or flexible sigmoidoscopy):  Excessive amounts of blood in the stool  Significant tenderness or worsening of abdominal pains  Swelling of the abdomen that is new, acute  Fever of 100F or higher  For urgent or emergent issues, a gastroenterologist can be reached at any hour by calling [phone].   DIET:  We do recommend a small meal at first, but then you may proceed to your regular diet.  Drink plenty of fluids but you should avoid alcoholic beverages for 24 hours.  ACTIVITY:  You should plan to take it easy for the rest of today and you should NOT DRIVE or  use heavy machinery until tomorrow (because of the sedation medicines used during the test).    FOLLOW UP: Our staff will call the number listed on your records the next business day following your procedure to check on you and address any questions or concerns that you may have regarding the information given to you following your procedure. If we do not reach you, we will leave a message.  However, if you are feeling well and you are not experiencing any problems, there is no need to return our call.  We will assume that you have returned to your regular daily activities without incident.  If any biopsies were taken you will be contacted by phone or by letter within the next 1-3 weeks.  Please call us at [phone] if you have not heard about the biopsies in 3 weeks.    SIGNATURES/CONFIDENTIALITY: You and/or your care partner have signed paperwork which will be entered into your electronic medical record.  These signatures attest to the fact that that the information above on your After Visit Summary has been reviewed and is understood.  Full responsibility of the confidentiality of this discharge information lies with you and/or your care-partner.

## 2018-11-16 ENCOUNTER — Telehealth: Payer: Self-pay | Admitting: *Deleted

## 2018-11-16 ENCOUNTER — Telehealth: Payer: Self-pay

## 2018-11-16 NOTE — Telephone Encounter (Signed)
Left message on f/u call 

## 2018-11-16 NOTE — Telephone Encounter (Signed)
Follow up call x 2, name identifier left a voicemail.

## 2018-11-18 ENCOUNTER — Encounter: Payer: Self-pay | Admitting: Internal Medicine

## 2019-09-10 ENCOUNTER — Other Ambulatory Visit: Payer: Self-pay

## 2019-09-10 DIAGNOSIS — IMO0001 Reserved for inherently not codable concepts without codable children: Secondary | ICD-10-CM

## 2019-09-10 DIAGNOSIS — R911 Solitary pulmonary nodule: Secondary | ICD-10-CM

## 2019-10-06 ENCOUNTER — Ambulatory Visit
Admission: RE | Admit: 2019-10-06 | Discharge: 2019-10-06 | Disposition: A | Discharge status: Home / Self Care | Payer: BC Managed Care – PPO | Source: Ambulatory Visit | Attending: Cardiothoracic Surgery | Admitting: Cardiothoracic Surgery

## 2019-10-06 ENCOUNTER — Other Ambulatory Visit: Payer: Self-pay

## 2019-10-06 DIAGNOSIS — R911 Solitary pulmonary nodule: Secondary | ICD-10-CM | POA: Diagnosis not present

## 2019-10-06 DIAGNOSIS — R918 Other nonspecific abnormal finding of lung field: Secondary | ICD-10-CM | POA: Diagnosis not present

## 2019-10-08 ENCOUNTER — Encounter: Payer: Self-pay | Admitting: Cardiothoracic Surgery

## 2019-10-08 ENCOUNTER — Ambulatory Visit (INDEPENDENT_AMBULATORY_CARE_PROVIDER_SITE_OTHER): Payer: BC Managed Care – PPO | Admitting: Cardiothoracic Surgery

## 2019-10-08 ENCOUNTER — Other Ambulatory Visit: Payer: Self-pay

## 2019-10-08 VITALS — BP 137/83 | HR 77 | Temp 97.7°F | Resp 14 | Ht 70.0 in | Wt 162.0 lb

## 2019-10-08 DIAGNOSIS — IMO0001 Reserved for inherently not codable concepts without codable children: Secondary | ICD-10-CM

## 2019-10-08 DIAGNOSIS — R911 Solitary pulmonary nodule: Secondary | ICD-10-CM | POA: Diagnosis not present

## 2019-10-08 NOTE — Progress Notes (Signed)
  Patient ID: Paul Fields, male   DOB: 05-24-1961, 58 y.o.   MRN: [id number]  HISTORY: He returns today in follow-up.  We have been following him for bilateral groundglass opacities as well as an index nodule in the right upper lobe which has slightly increased in size and now measures about 6 mm.  His first scan was 3 years ago.  He has a strong family history of lung cancer.  In addition he continues to smoke at least a pack cigarettes a day.  He also had an episode of hemoptysis 4 to 6 weeks ago.  He does not complain of any significant shortness of breath at this time.  He denied any fevers or chills.   Vitals:   10/08/19 0800  BP: 137/83  Pulse: 77  Resp: 14  Temp: 97.7 F (36.5 C)  SpO2: 96%     EXAM:    Resp: Lungs are clear bilaterally.  No respiratory distress, normal effort. Heart:  Regular without murmurs Abd:  Abdomen is soft, non distended and non tender. No masses are palpable.  There is no rebound and no guarding.  Neurological: Alert and oriented to person, place, and time. Coordination normal.  Skin: Skin is warm and dry. No rash noted. No diaphoretic. No erythema. No pallor.  Psychiatric: Normal mood and affect. Normal behavior. Judgment and thought content normal.    ASSESSMENT: I have independently reviewed his chest CT.  There is a posterior right upper lobe nodule that is more solid in appearance and now measures about 6 mm.  There are multiple scattered bilateral pulmonary nodules including some groundglass opacities.   PLAN:   I had a long discussion today with the patient and his wife regarding the options.  I strongly encouraged him to consider tobacco cessation.  He would like to enroll in our tobacco cessation program and we will make that referral.  I also reviewed with him the need for continued surveillance.  At the present time I recommend that we bring him back in 12 months with another CT scan of the chest.  In addition I would like the patient  to be seen by Dr. Ilda Fields.  The patient was previously under the care of Dr. Jolyn Fields and who is now departed our institution.  All of his questions were answered.  We will see him back again in 1 year    Paul Lewandowsky, MD

## 2019-10-08 NOTE — Patient Instructions (Addendum)
We will contact you November 2021 to schedule the CT Chest  without contrast  for December 2021.   Please try to quit smoking.     Steps to Quit Smoking Smoking tobacco is the leading cause of preventable death. It can affect almost every organ in the body. Smoking puts you and those around you at risk for developing many serious chronic diseases. Quitting smoking can be difficult, but it is one of the best things that you can do for your health. It is never too late to quit. How do I get ready to quit? When you decide to quit smoking, create a plan to help you succeed. Before you quit:  Pick a date to quit. Set a date within the next 2 weeks to give you time to prepare.  Write down the reasons why you are quitting. Keep this list in places where you will see it often.  Tell your family, friends, and co-workers that you are quitting. Support from your loved ones can make quitting easier.  Talk with your health care provider about your options for quitting smoking.  Find out what treatment options are covered by your health insurance.  Identify people, places, things, and activities that make you want to smoke (triggers). Avoid them. What first steps can I take to quit smoking?  Throw away all cigarettes at home, at work, and in your car.  Throw away smoking accessories, such as Scientist, research (medical).  Clean your car. Make sure to empty the ashtray.  Clean your home, including curtains and carpets. What strategies can I use to quit smoking? Talk with your health care provider about combining strategies, such as taking medicines while you are also receiving in-person counseling. Using these two strategies together makes you more likely to succeed in quitting than if you used either strategy on its own.  If you are pregnant or breastfeeding, talk with your health care provider about finding counseling or other support strategies to quit smoking. Do not take medicine to help you quit  smoking unless your health care provider tells you to do so. To quit smoking: Quit right away  Quit smoking completely, instead of gradually reducing how much you smoke over a period of time. Research shows that stopping smoking right away is more successful than gradually quitting.  Attend in-person counseling to help you build problem-solving skills. You are more likely to succeed in quitting if you attend counseling sessions regularly. Even short sessions of 10 minutes can be effective. Take medicine You may take medicines to help you quit smoking. Some medicines require a prescription and some you can purchase over-the-counter. Medicines may have nicotine in them to replace the nicotine in cigarettes. Medicines may:  Help to stop cravings.  Help to relieve withdrawal symptoms. Your health care provider may recommend:  Nicotine patches, gum, or lozenges.  Nicotine inhalers or sprays.  Non-nicotine medicine that is taken by mouth. Find resources Find resources and support systems that can help you to quit smoking and remain smoke-free after you quit. These resources are most helpful when you use them often. They include:  Online chats with a Social worker.  Telephone quitlines.  Printed Furniture conservator/restorer.  Support groups or group counseling.  Text messaging programs.  Mobile phone apps or applications. Use apps that can help you stick to your quit plan by providing reminders, tips, and encouragement. There are many free apps for mobile devices as well as websites. Examples include Quit Guide from the [employer] and smokefree.gov  What things can I do to make it easier to quit?   Reach out to your family and friends for support and encouragement. Call telephone quitlines (1-800-QUIT-NOW), reach out to support groups, or work with a counselor for support.  Ask people who smoke to avoid smoking around you.  Avoid places that trigger you to smoke, such as bars, parties, or smoke-break  areas at work.  Spend time with people who do not smoke.  Lessen the stress in your life. Stress can be a smoking trigger for some people. To lessen stress, try: ? Exercising regularly. ? Doing deep-breathing exercises. ? Doing yoga. ? Meditating. ? Performing a body scan. This involves closing your eyes, scanning your body from head to toe, and noticing which parts of your body are particularly tense. Try to relax the muscles in those areas. How will I feel when I quit smoking? Day 1 to 3 weeks Within the first 24 hours of quitting smoking, you may start to feel withdrawal symptoms. These symptoms are usually most noticeable 2-3 days after quitting, but they usually do not last for more than 2-3 weeks. You may experience these symptoms:  Mood swings.  Restlessness, anxiety, or irritability.  Trouble concentrating.  Dizziness.  Strong cravings for sugary foods and nicotine.  Mild weight gain.  Constipation.  Nausea.  Coughing or a sore throat.  Changes in how the medicines that you take for unrelated issues work in your body.  Depression.  Trouble sleeping (insomnia). Week 3 and afterward After the first 2-3 weeks of quitting, you may start to notice more positive results, such as:  Improved sense of smell and taste.  Decreased coughing and sore throat.  Slower heart rate.  Lower blood pressure.  Clearer skin.  The ability to breathe more easily.  Fewer sick days. Quitting smoking can be very challenging. Do not get discouraged if you are not successful the first time. Some people need to make many attempts to quit before they achieve long-term success. Do your best to stick to your quit plan, and talk with your health care provider if you have any questions or concerns. Summary  Smoking tobacco is the leading cause of preventable death. Quitting smoking is one of the best things that you can do for your health.  When you decide to quit smoking, create a plan  to help you succeed.  Quit smoking right away, not slowly over a period of time.  When you start quitting, seek help from your health care provider, family, or friends. This information is not intended to replace advice given to you by your health care provider. Make sure you discuss any questions you have with your health care provider. Document Released: 10/08/2001 Document Revised: 01/01/2019 Document Reviewed: 01/02/2019 Elsevier Patient Education  2020 [employer].

## 2019-10-12 ENCOUNTER — Telehealth: Payer: Self-pay | Admitting: *Deleted

## 2019-11-09 ENCOUNTER — Other Ambulatory Visit: Payer: Self-pay

## 2019-11-09 ENCOUNTER — Encounter: Payer: Self-pay | Admitting: Pulmonary Disease

## 2019-11-09 ENCOUNTER — Ambulatory Visit: Payer: BC Managed Care – PPO | Admitting: Pulmonary Disease

## 2019-11-09 VITALS — BP 130/78 | HR 69 | Temp 97.5°F | Ht 70.0 in | Wt 163.2 lb

## 2019-11-09 DIAGNOSIS — J4489 Other specified chronic obstructive pulmonary disease: Secondary | ICD-10-CM

## 2019-11-09 DIAGNOSIS — J449 Chronic obstructive pulmonary disease, unspecified: Secondary | ICD-10-CM

## 2019-11-09 MED ORDER — BREO ELLIPTA 100-25 MCG/INH IN AEPB: 1.0000 | INHALATION_SPRAY | Freq: Every day | RESPIRATORY_TRACT | 0 refills | Status: DC

## 2019-11-09 MED ORDER — BREO ELLIPTA 100-25 MCG/INH IN AEPB: 1.0000 | INHALATION_SPRAY | Freq: Every day | RESPIRATORY_TRACT | 11 refills | Status: DC

## 2019-11-09 NOTE — Progress Notes (Signed)
    Assessment & Plan:  There are no diagnoses linked to this encounter.  Patient Instructions  1.  I recommend that you discontinue smoking.  Start with the patches.  Make sure you take the patch off at bedtime and then put a new one in the morning.  2.  Recommend also that you make an appointment with Dr. Maribeth to do a complete physical (well visit).  3.  We will give you a trial of Breo Ellipta  1 inhalation daily.  Make sure you rinse your mouth well after use it.  Please let us  know how this is working for you.  4.  We will see you in follow-up in 3 months time.  Hopefully by that time we will be able to schedule you for breathing tests.  Please note: late entry documentation due to logistical difficulties during COVID-19 pandemic. This note is filed for information purposes only, and is not intended to be used for billing, nor does it represent the full scope/nature of the visit in question. Please see any associated scanned media linked to date of encounter for additional pertinent information.  Subjective:    HPI: Paul Fields is a 59 y.o. 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 Coughing with clear mucous. Stated that he wheezes all the time. Denies any fever or chills.)     Outpatient Encounter Medications as of 11/09/2019  Medication Sig   [DISCONTINUED] fluticasone  furoate-vilanterol (BREO ELLIPTA ) 100-25 MCG/INH AEPB Inhale 1 puff into the lungs daily. (Patient not taking: Reported on 01/09/2021)   [DISCONTINUED] fluticasone  furoate-vilanterol (BREO ELLIPTA ) 100-25 MCG/INH AEPB Inhale 1 puff into the lungs daily.   No facility-administered encounter medications on file as of 11/09/2019.      Objective:   Vitals:   11/09/19 1111  BP: 130/78  Pulse: 69  Temp: (!) 97.5 F (36.4 C)  Height: 5' 10 (1.778 m)  Weight: 163 lb 3.2 oz (74 kg)  SpO2:  97% Comment: on ra  TempSrc: Temporal  BMI (Calculated): 23.42     Physical exam documentation is limited by delayed entry of information.

## 2019-11-09 NOTE — Patient Instructions (Signed)
1.  I recommend that you discontinue smoking.  Start with the patches.  Make sure you take the patch off at bedtime and then put a new one in the morning.  2.  Recommend also that you make an appointment with Dr. Caryl Bis to do a complete physical (well visit).  3.  We will give you a trial of Breo Ellipta 1 inhalation daily.  Make sure you rinse your mouth well after use it.  Please let us know how this is working for you.  4.  We will see you in follow-up in 3 months time.  Hopefully by that time we will be able to schedule you for breathing tests.

## 2019-12-08 ENCOUNTER — Telehealth: Payer: Self-pay | Admitting: Pulmonary Disease

## 2019-12-08 NOTE — Telephone Encounter (Addendum)
Please advise if you would like to give him samples or switch his medication.  Made wife aware.

## 2019-12-10 NOTE — Telephone Encounter (Signed)
Not have any coupons could go on the [employer] website to see if they have any.  Otherwise ask insurance what would be suitable lower cost alternative and we can write the prescription for that.  CLG

## 2019-12-10 NOTE — Telephone Encounter (Signed)
Pt is aware of recommendations and voiced his understanding.  Pt will call back with list of alternatives if unable to locate coupon online. Nothing further is needed at this time.

## 2019-12-14 ENCOUNTER — Ambulatory Visit (INDEPENDENT_AMBULATORY_CARE_PROVIDER_SITE_OTHER): Payer: BC Managed Care – PPO | Admitting: Family Medicine

## 2019-12-14 ENCOUNTER — Telehealth: Payer: Self-pay | Admitting: Family Medicine

## 2019-12-14 ENCOUNTER — Encounter: Payer: Self-pay | Admitting: Family Medicine

## 2019-12-14 ENCOUNTER — Other Ambulatory Visit: Payer: Self-pay

## 2019-12-14 VITALS — BP 118/70 | HR 67 | Temp 97.0°F | Ht 70.0 in | Wt 164.4 lb

## 2019-12-14 DIAGNOSIS — Z1329 Encounter for screening for other suspected endocrine disorder: Secondary | ICD-10-CM

## 2019-12-14 DIAGNOSIS — M79645 Pain in left finger(s): Secondary | ICD-10-CM

## 2019-12-14 DIAGNOSIS — R7309 Other abnormal glucose: Secondary | ICD-10-CM | POA: Diagnosis not present

## 2019-12-14 DIAGNOSIS — L819 Disorder of pigmentation, unspecified: Secondary | ICD-10-CM | POA: Diagnosis not present

## 2019-12-14 DIAGNOSIS — Z1159 Encounter for screening for other viral diseases: Secondary | ICD-10-CM

## 2019-12-14 DIAGNOSIS — Z0001 Encounter for general adult medical examination with abnormal findings: Secondary | ICD-10-CM | POA: Diagnosis not present

## 2019-12-14 DIAGNOSIS — C61 Malignant neoplasm of prostate: Secondary | ICD-10-CM | POA: Diagnosis not present

## 2019-12-14 DIAGNOSIS — Z130 Encounter for screening for diseases of the blood and blood-forming organs and certain disorders involving the immune mechanism: Secondary | ICD-10-CM | POA: Diagnosis not present

## 2019-12-14 DIAGNOSIS — G8929 Other chronic pain: Secondary | ICD-10-CM

## 2019-12-14 DIAGNOSIS — Z13220 Encounter for screening for lipoid disorders: Secondary | ICD-10-CM | POA: Diagnosis not present

## 2019-12-14 LAB — LIPID PANEL
Cholesterol: 182 mg/dL (ref 0–200)
HDL: 63.9 mg/dL (ref >39.00)
LDL Cholesterol: 105 mg/dL — ABNORMAL HIGH (ref 0–99)
NonHDL: 117.88
Total CHOL/HDL Ratio: 3
Triglycerides: 63 mg/dL (ref 0.0–149.0)
VLDL: 12.6 mg/dL (ref 0.0–40.0)

## 2019-12-14 LAB — CBC
HCT: 50 % (ref 39.0–52.0)
Hemoglobin: 16.7 g/dL (ref 13.0–17.0)
MCHC: 33.5 g/dL (ref 30.0–36.0)
MCV: 98.4 fl (ref 78.0–100.0)
Platelets: 226 10*3/uL (ref 150.0–400.0)
RBC: 5.08 Mil/uL (ref 4.22–5.81)
RDW: 13.1 % (ref 11.5–15.5)
WBC: 5.8 10*3/uL (ref 4.0–10.5)

## 2019-12-14 LAB — COMPREHENSIVE METABOLIC PANEL
ALT: 14 U/L (ref 0–53)
AST: 11 U/L (ref 0–37)
Albumin: 4.6 g/dL (ref 3.5–5.2)
Alkaline Phosphatase: 51 U/L (ref 39–117)
BUN: 21 mg/dL (ref 6–23)
CO2: 26 mEq/L (ref 19–32)
Calcium: 9.3 mg/dL (ref 8.4–10.5)
Chloride: 103 mEq/L (ref 96–112)
Creatinine, Ser: 1.1 mg/dL (ref 0.40–1.50)
GFR: 68.17 mL/min (ref >60.00)
Glucose, Bld: 97 mg/dL (ref 70–99)
Potassium: 4.5 mEq/L (ref 3.5–5.1)
Sodium: 136 mEq/L (ref 135–145)
Total Bilirubin: 0.4 mg/dL (ref 0.2–1.2)
Total Protein: 6.7 g/dL (ref 6.0–8.3)

## 2019-12-14 LAB — PSA: PSA: 0.85 ng/mL (ref 0.10–4.00)

## 2019-12-14 LAB — TSH: TSH: 1.05 u[IU]/mL (ref 0.35–4.50)

## 2019-12-14 LAB — HEMOGLOBIN A1C: Hgb A1c MFr Bld: 6.3 % (ref 4.6–6.5)

## 2019-12-14 NOTE — Patient Instructions (Signed)
Nice to see you.  We will contact you with your lab results.  I placed a referral to dermatology. Please monitor your left thumb.  The issue is likely related to arthritis.  You can take ibuprofen occasionally over-the-counter for this.  Please take it with food.  If you start to use this most days please let us know and we will change the medication to something different.

## 2019-12-14 NOTE — Telephone Encounter (Signed)
LVM to set up one year CPE

## 2019-12-15 LAB — HEPATITIS C ANTIBODY
Hepatitis C Ab: NONREACTIVE
SIGNAL TO CUT-OFF: 0.01 (ref <1.00)

## 2019-12-19 DIAGNOSIS — Z125 Encounter for screening for malignant neoplasm of prostate: Secondary | ICD-10-CM | POA: Insufficient documentation

## 2019-12-19 DIAGNOSIS — G8929 Other chronic pain: Secondary | ICD-10-CM | POA: Insufficient documentation

## 2019-12-19 DIAGNOSIS — Z0001 Encounter for general adult medical examination with abnormal findings: Secondary | ICD-10-CM | POA: Insufficient documentation

## 2019-12-19 NOTE — Assessment & Plan Note (Signed)
Physical exam completed.  Encouraged healthy diet and continuing to stay active.  PSA ordered for prostate cancer screening.  I encouraged him to get the Covid vaccine when it becomes available.  I encouraged him to continue to cut down on smoking.  Lab work as outlined below.

## 2019-12-19 NOTE — Assessment & Plan Note (Signed)
Likely an SK.  Will refer to a different dermatologist.

## 2019-12-19 NOTE — Assessment & Plan Note (Signed)
Likely arthritic in nature.  Discussed occasional use of ibuprofen over-the-counter for this.

## 2019-12-19 NOTE — Progress Notes (Signed)
Tommi Rumps, MD Phone: [phone]  Paul Fields is a 59 y.o. male who presents today for CPE  Diet: not many fruits or vegetables, not much junk, drinks coffee and mountain dew though not much water Exercise: job is very active Colonoscopy: brother with colon cancer, patient colonoscopy 11/13/18 with 3 year recall Prostate cancer screening: due Vaccines-   Flu: declines  Tetanus: UTD  Shingles: declines  COVID19: he is going to get it when it is available to him  Pneumonia: UTD HIV screening: 20 years ago Hep C Screening: completed Tobacco use: cut down to less than 1 PPD, was smoking 1.5-2 PPD, he follows with thoracic surgery for a lung nodule, has screening CT scans Alcohol use: no Illicit Drug use: no Dentist: occasionally  Ophthalmology: yes  [place]: Patient notes he has a skin lesion on his right back that is darker and more ridged than it had been.  He saw dermatology a couple of years ago though wants to see somebody different.  Left thumb swelling: This occurs at the base of his thumb at the MCP joint.  This has been going on a long time.  Occasionally there is some discomfort.  There is no tenderness to palpation.  No prior injury.  Patient reports chronic cough and trouble breathing that is no different than usual related to his emphysema.   Active Ambulatory Problems    Diagnosis Date Noted  . Anxiety 02/21/2012  . Screening for colon cancer 05/27/2012  . Tobacco use 05/27/2012  . Hx of adenomatous colonic polyps 08/11/2012  . Personal history of tobacco use, presenting hazards to health 03/14/2016  . Nodule of right lung 04/04/2016  . Foot pain, right 11/26/2016  . Hoarseness of voice 11/26/2016  . Neck pain 11/26/2016  . Centrilobular emphysema ([place]) 01/14/2018  . Hyperpigmented skin lesion 01/14/2018  . GERD (gastroesophageal reflux disease) 04/24/2018  . Encounter for general adult medical examination with abnormal findings 12/19/2019  . Chronic  pain of left thumb 12/19/2019   Resolved Ambulatory Problems    Diagnosis Date Noted  . Otitis media, right 02/21/2012  . General medical exam 05/27/2012  . Ecchymoses, spontaneous 05/27/2012  . Microscopic hematuria 06/01/2013  . Routine general medical examination at a health care facility 03/07/2016  . Major depressive disorder, recurrent episode ([place]) 03/07/2016   Past Medical History:  Diagnosis Date  . Arthritis   . Clavicle fracture 1986  . COPD (chronic obstructive pulmonary disease) ([place])   . Diverticulosis   . Elbow fracture, right 1968  . Emphysema lung ([place])   . Hemorrhoid   . Polyp of colon, adenomatous     Family History  Problem Relation Age of Onset  . COPD Father   . Arthritis Father   . Parkinsonism Mother   . Diabetes Mother        borderline  . Lung cancer Mother   . Cancer Mother        Lung  . Colon cancer Maternal Uncle        diagnosed in his 47s  . Alzheimer's disease Paternal Aunt   . COPD Paternal Uncle   . Lung cancer Maternal Grandfather        Ecologist  . Prostate cancer Maternal Uncle        diagnosed in his 65s  . Alzheimer's disease Paternal Aunt   . Heart attack Paternal Uncle   . Brain cancer Paternal Uncle 55  . Lymphoma Cousin  paternal cousin  . COPD Brother        prostate  . Cancer Paternal Grandfather        lung  . Heart disease Neg Hx   . Stomach cancer Neg Hx   . Rectal cancer Neg Hx   . Colon polyps Neg Hx   . Esophageal cancer Neg Hx     Social History   Socioeconomic History  . Marital status: Married    Spouse name: Not on file  . Number of children: 3  . Years of education: Not on file  . Highest education level: Not on file  Occupational History  . Occupation: truck Education administrator: [employer]  Tobacco Use  . Smoking status: Current Every Day Smoker    Packs/day: 1.50    Years: 38.00    Pack years: 57.00    Types: Cigarettes    Last attempt to quit: 04/16/2016    Years since  quitting: 3.6  . Smokeless tobacco: Never Used  . Tobacco comment: tobacco info given 08/11/12  Substance and Sexual Activity  . Alcohol use: No    Comment: History of 6 pk beer on weekends- Last [facility] 11/2015  . Drug use: No  . Sexual activity: Not on file  Other Topics Concern  . Not on file  Social History Narrative  . Not on file   Social Determinants of Health   Financial Resource Strain:   . Difficulty of Paying Living Expenses: Not on file  Food Insecurity:   . Worried About Charity fundraiser in the Last Year: Not on file  . Ran Out of Food in the Last Year: Not on file  Transportation Needs:   . Lack of Transportation (Medical): Not on file  . Lack of Transportation (Non-Medical): Not on file  Physical Activity:   . Days of Exercise per Week: Not on file  . Minutes of Exercise per Session: Not on file  Stress:   . Feeling of Stress : Not on file  Social Connections:   . Frequency of Communication with Friends and Family: Not on file  . Frequency of Social Gatherings with Friends and Family: Not on file  . Attends Religious Services: Not on file  . Active Member of Clubs or Organizations: Not on file  . Attends Archivist Meetings: Not on file  . Marital Status: Not on file  Intimate Partner Violence:   . Fear of Current or Ex-Partner: Not on file  . Emotionally Abused: Not on file  . Physically Abused: Not on file  . Sexually Abused: Not on file    ROS  General:  Negative for nexplained weight loss, fever Skin: Positive for new or changing mole, negative for sore that won't heal HEENT: Negative for trouble hearing, trouble seeing, ringing in ears, mouth sores, hoarseness, change in voice, dysphagia. CV:  Negative for chest pain, edema, palpitations Resp: Positive for cough, dyspnea, negative for hemoptysis GI: Negative for nausea, vomiting, diarrhea, constipation, abdominal pain, melena, hematochezia. GU: Negative for dysuria, incontinence, urinary  hesitance, hematuria, vaginal or penile discharge, polyuria, sexual difficulty, lumps in testicle or breasts MSK: Positive for muscle cramps or aches, joint pain or swelling Neuro: Negative for headaches, weakness, numbness, dizziness, passing out/fainting Psych: Negative for depression, anxiety, memory problems  Objective  Physical Exam Vitals:   12/14/19 1114  BP: 118/70  Pulse: 67  Temp: (!) 97 F (36.1 C)  SpO2: 98%    BP Readings from Last  3 Encounters:  12/14/19 118/70  11/09/19 130/78  10/08/19 137/83   Wt Readings from Last 3 Encounters:  12/14/19 164 lb 6.4 oz (74.6 kg)  11/09/19 163 lb 3.2 oz (74 kg)  10/08/19 162 lb (73.5 kg)    Physical Exam Constitutional:      General: He is not in acute distress.    Appearance: He is not diaphoretic.  HENT:     Head: Normocephalic and atraumatic.  Eyes:     Conjunctiva/sclera: Conjunctivae normal.     Pupils: Pupils are equal, round, and reactive to light.  Cardiovascular:     Rate and Rhythm: Normal rate and regular rhythm.     Heart sounds: Normal heart sounds.  Pulmonary:     Effort: Pulmonary effort is normal.     Breath sounds: Normal breath sounds.  Abdominal:     General: Bowel sounds are normal. There is no distension.     Palpations: Abdomen is soft.     Tenderness: There is no abdominal tenderness. There is no guarding or rebound.  Musculoskeletal:     Right lower leg: No edema.     Left lower leg: No edema.  Lymphadenopathy:     Cervical: No cervical adenopathy.  Skin:    General: Skin is warm and dry.       Neurological:     Mental Status: He is alert.  Psychiatric:        Mood and Affect: Mood normal.      Assessment/Plan:   Encounter for general adult medical examination with abnormal findings Physical exam completed.  Encouraged healthy diet and continuing to stay active.  PSA ordered for prostate cancer screening.  I encouraged him to get the Covid vaccine when it becomes available.  I  encouraged him to continue to cut down on smoking.  Lab work as outlined below.  Hyperpigmented skin lesion Likely an SK.  Will refer to a different dermatologist.  Chronic pain of left thumb Likely arthritic in nature.  Discussed occasional use of ibuprofen over-the-counter for this.   Orders Placed This Encounter  Procedures  . Hepatitis C Antibody  . HgB A1c  . PSA  . Lipid panel  . Comp Met (CMET)  . TSH  . CBC  . Ambulatory referral to Dermatology    Referral Priority:   Routine    Referral Type:   Consultation    Referral Reason:   Specialty Services Required    Requested Specialty:   Dermatology    Number of Visits Requested:   1    No orders of the defined types were placed in this encounter.   This visit occurred during the SARS-CoV-2 public health emergency.  Safety protocols were in place, including screening questions prior to the visit, additional usage of staff PPE, and extensive cleaning of exam room while observing appropriate contact time as indicated for disinfecting solutions.    Tommi Rumps, MD [place]

## 2019-12-20 ENCOUNTER — Telehealth: Payer: Self-pay | Admitting: Family Medicine

## 2019-12-20 NOTE — Telephone Encounter (Signed)
Pt called about lab results. Would like a call back.

## 2019-12-21 NOTE — Telephone Encounter (Signed)
I spoke with the patient yesterday and gave lab results and he understood.  Paul Fields,cma

## 2020-09-01 ENCOUNTER — Telehealth: Payer: Self-pay | Admitting: Family Medicine

## 2020-09-01 NOTE — Telephone Encounter (Signed)
Pt wife called..pt is having numbness going down his left arm and into his hand and is lightheaded/ fatigue  This has been going on for the past few days

## 2020-09-01 NOTE — Telephone Encounter (Signed)
Agree with urgent care evaluation. Please follow-up with the patient to make sure he was seen for this.

## 2020-09-01 NOTE — Telephone Encounter (Signed)
Patient got up two days ago. He was lightheaded and fell against closet door, he didn't faint, just felt very drained and faint., left limb px, shoulder to wrist pain. Patient is not having any unusual SOB, Chest pain, blurry vision, facial droop , and no slurred speech. Patient is also having headaches that are chronic per the wife. Instructed wife to have patient top to UC/ED due to [place]. Patients wife stated she will try to convince him to go.

## 2020-09-02 ENCOUNTER — Emergency Department: Payer: BC Managed Care – PPO

## 2020-09-02 ENCOUNTER — Emergency Department
Admission: EM | Admit: 2020-09-02 | Discharge: 2020-09-02 | Disposition: A | Discharge status: Home / Self Care | Payer: BC Managed Care – PPO | Attending: Emergency Medicine | Admitting: Emergency Medicine

## 2020-09-02 ENCOUNTER — Other Ambulatory Visit: Payer: Self-pay

## 2020-09-02 DIAGNOSIS — F17210 Nicotine dependence, cigarettes, uncomplicated: Secondary | ICD-10-CM | POA: Diagnosis not present

## 2020-09-02 DIAGNOSIS — M5412 Radiculopathy, cervical region: Secondary | ICD-10-CM | POA: Diagnosis not present

## 2020-09-02 DIAGNOSIS — M25512 Pain in left shoulder: Secondary | ICD-10-CM | POA: Diagnosis not present

## 2020-09-02 DIAGNOSIS — R42 Dizziness and giddiness: Secondary | ICD-10-CM | POA: Insufficient documentation

## 2020-09-02 DIAGNOSIS — J449 Chronic obstructive pulmonary disease, unspecified: Secondary | ICD-10-CM | POA: Diagnosis not present

## 2020-09-02 DIAGNOSIS — M79602 Pain in left arm: Secondary | ICD-10-CM | POA: Diagnosis present

## 2020-09-02 LAB — BASIC METABOLIC PANEL
Anion gap: 8 (ref 5–15)
BUN: 19 mg/dL (ref 8–23)
CO2: 25 mmol/L (ref 22–32)
Calcium: 8.7 mg/dL — ABNORMAL LOW (ref 8.9–10.3)
Chloride: 103 mmol/L (ref 98–111)
Creatinine, Ser: 1.03 mg/dL (ref 0.61–1.24)
GFR, Estimated: >60 mL/min (ref >60)
Glucose, Bld: 97 mg/dL (ref 70–99)
Potassium: 4.2 mmol/L (ref 3.5–5.1)
Sodium: 136 mmol/L (ref 135–145)

## 2020-09-02 LAB — CBC
HCT: 47.4 % (ref 39.0–52.0)
Hemoglobin: 16.4 g/dL (ref 13.0–17.0)
MCH: 32.9 pg (ref 26.0–34.0)
MCHC: 34.6 g/dL (ref 30.0–36.0)
MCV: 95.2 fL (ref 80.0–100.0)
Platelets: 229 10*3/uL (ref 150–400)
RBC: 4.98 MIL/uL (ref 4.22–5.81)
RDW: 12 % (ref 11.5–15.5)
WBC: 6.3 10*3/uL (ref 4.0–10.5)
nRBC: 0 % (ref 0.0–0.2)

## 2020-09-02 LAB — TROPONIN I (HIGH SENSITIVITY): Troponin I (High Sensitivity): 3 ng/L (ref <18)

## 2020-09-02 MED ORDER — MELOXICAM 15 MG PO TABS: 15.0000 mg | ORAL_TABLET | Freq: Every day | ORAL | 0 refills | Status: DC

## 2020-09-02 MED ORDER — METHOCARBAMOL 500 MG PO TABS: 500.0000 mg | ORAL_TABLET | Freq: Four times a day (QID) | ORAL | 0 refills | Status: DC

## 2020-09-02 MED ORDER — PREDNISONE 50 MG PO TABS: 50.0000 mg | ORAL_TABLET | Freq: Every day | ORAL | 0 refills | Status: DC

## 2020-09-02 MED ORDER — IOHEXOL 350 MG/ML SOLN
75.0000 mL | Freq: Once | INTRAVENOUS | Status: AC | PRN
  Administered 2020-09-02: 75 mL via INTRAVENOUS
  Filled 2020-09-02: qty 75

## 2020-09-02 NOTE — ED Triage Notes (Signed)
Pt arrive to ER from [facility] for numbness to L arm. Pt states numbness x 1 month. Denies injury to arm or back. States feels off balance first thing in morning x few mornings now. States after 20 minutes balance is fine. Pt ambulatory with steady gait. Able to move L arm without difficulty. Speech clear.

## 2020-09-02 NOTE — ED Provider Notes (Signed)
[facility] Emergency Department Provider Note  ____________________________________________  Time seen: Approximately 2:20 PM  I have reviewed the triage vital signs and the nursing notes.   HISTORY  Chief Complaint Numbness    HPI Paul Fields is a 59 y.o. male who presents the emergency department with 2 complaints.  Patient states that for approximately the past month he has had some intermittent numbness and tingling running down the left arm.  He states that he will have some pain in his shoulder both the lateral and posterior aspect.  Patient states that the numbness and tingling is worse in the morning but can be present throughout the day.  Patient states that he has numbness and tingling of the third, fourth and fifth digits.  Patient reports no trauma to the head or neck.  No history of cervical radiculopathy.  Patient states that initially he thought he "pulled my shoulder" but as the symptoms progressed to include left neck, left shoulder, left arm he was concerned that it could be an alternative source.  No history of CVA or TIA.  Patient is also concerned as he has had several episodes of dizziness, weakness, near syncope.  Patient states that this also seems to be primarily in the morning.  He states that he had an episode 2 days ago where he almost passed out causing him to stumble, hitting the wall.  Patient denies any chest pain, shortness of breath.  No nausea, vomiting, diarrhea.  Patient does have a history of COPD, GERD, arthritis.  Patient does not take any medications for his complaint prior to arrival.  No other complaints at this time.  Patient is currently asymptomatic for both complaints.        Past Medical History:  Diagnosis Date  . Arthritis   . Clavicle fracture 1986  . COPD (chronic obstructive pulmonary disease) ([place])   . Diverticulosis   . Elbow fracture, right 1968  . Emphysema lung ([place])   . Hemorrhoid   . Major  depressive disorder, recurrent episode ([place]) 03/07/2016   when mom passed   . Polyp of colon, adenomatous    possible based on genetic testing    Patient Active Problem List   Diagnosis Date Noted  . Encounter for general adult medical examination with abnormal findings 12/19/2019  . Chronic pain of left thumb 12/19/2019  . GERD (gastroesophageal reflux disease) 04/24/2018  . Centrilobular emphysema ([place]) 01/14/2018  . Hyperpigmented skin lesion 01/14/2018  . Foot pain, right 11/26/2016  . Hoarseness of voice 11/26/2016  . Neck pain 11/26/2016  . Nodule of right lung 04/04/2016  . Personal history of tobacco use, presenting hazards to health 03/14/2016  . Hx of adenomatous colonic polyps 08/11/2012  . Screening for colon cancer 05/27/2012  . Tobacco use 05/27/2012  . Anxiety 02/21/2012    Past Surgical History:  Procedure Laterality Date  . COLONOSCOPY    . COLONOSCOPY W/ BIOPSIES  04/2015   polyps-benign  . ELBOW FRACTURE SURGERY Right 1967   Pins, then hardware removal  . ELBOW HARDWARE REMOVAL Right 1969   right  . POLYPECTOMY    . polyps  2013   20 polyps biopsies as tubular adenomas  . right toe surgery  2018  . [place]    Prior to Admission medications   Medication Sig Start Date End Date Taking? Authorizing Provider  fluticasone furoate-vilanterol (BREO ELLIPTA) 100-25 MCG/INH AEPB Inhale 1 puff into the lungs daily. 11/09/19  Tyler Pita, MD  fluticasone furoate-vilanterol (BREO ELLIPTA) 100-25 MCG/INH AEPB Inhale 1 puff into the lungs daily. 11/09/19   Tyler Pita, MD  meloxicam (MOBIC) 15 MG tablet Take 1 tablet (15 mg total) by mouth daily. 09/02/20   Jovani Colquhoun, Charline Bills, PA-C  methocarbamol (ROBAXIN) 500 MG tablet Take 1 tablet (500 mg total) by mouth 4 (four) times daily. 09/02/20   Natalie Mceuen, Charline Bills, PA-C  predniSONE (DELTASONE) 50 MG tablet Take 1 tablet (50 mg total) by mouth daily with breakfast. 09/02/20   Dereon Corkery,  Charline Bills, PA-C    Allergies Patient has no known allergies.  Family History  Problem Relation Age of Onset  . COPD Father   . Arthritis Father   . Parkinsonism Mother   . Diabetes Mother        borderline  . Lung cancer Mother   . Cancer Mother        Lung  . Colon cancer Maternal Uncle        diagnosed in his 24s  . Alzheimer's disease Paternal Aunt   . COPD Paternal Uncle   . Lung cancer Maternal Grandfather        Ecologist  . Prostate cancer Maternal Uncle        diagnosed in his 82s  . Alzheimer's disease Paternal Aunt   . Heart attack Paternal Uncle   . Brain cancer Paternal Uncle 38  . Lymphoma Cousin        paternal cousin  . COPD Brother        prostate  . Cancer Paternal Grandfather        lung  . Heart disease Neg Hx   . Stomach cancer Neg Hx   . Rectal cancer Neg Hx   . Colon polyps Neg Hx   . Esophageal cancer Neg Hx     Social History Social History   Tobacco Use  . Smoking status: Current Every Day Smoker    Packs/day: 1.50    Years: 38.00    Pack years: 57.00    Types: Cigarettes    Last attempt to quit: 04/16/2016    Years since quitting: 4.3  . Smokeless tobacco: Never Used  . Tobacco comment: tobacco info given 08/11/12  Vaping Use  . Vaping Use: Never used  Substance Use Topics  . Alcohol use: No    Comment: History of 6 pk beer on weekends- Last [facility] 11/2015  . Drug use: No     Review of Systems  Constitutional: No fever/chills.  Positive for intermittent weakness and dizziness.  None currently. Eyes: No visual changes. No discharge ENT: No upper respiratory complaints. Cardiovascular: no chest pain. Respiratory: no cough. No SOB. Gastrointestinal: No abdominal pain.  No nausea, no vomiting.  No diarrhea.  No constipation. Genitourinary: Negative for dysuria. No hematuria Musculoskeletal: Positive for left posterior neck, left shoulder pain with intermittent numbness and tingling of the left arm.  Numbness and tingling  involves the third, fourth, fifth digits. Skin: Negative for rash, abrasions, lacerations, ecchymosis. Neurological: Negative for headaches, focal weakness.  Positive for numbness and tingling of the left arm as described above.  Not present currently.  10 System ROS otherwise negative.  ____________________________________________   PHYSICAL EXAM:  VITAL SIGNS: ED Triage Vitals  Enc Vitals Group     BP 09/02/20 1233 116/88     Pulse Rate 09/02/20 1233 67     Resp 09/02/20 1233 16     Temp 09/02/20 1233 97.9 F (  36.6 C)     Temp Source 09/02/20 1233 Oral     SpO2 09/02/20 1233 97 %     Weight 09/02/20 1234 165 lb (74.8 kg)     Height 09/02/20 1234 5\' 10"  (1.778 m)     Head Circumference --      Peak Flow --      Pain Score 09/02/20 1233 5     Pain Loc --      Pain Edu? --      Excl. in [place]? --      Constitutional: Alert and oriented. Well appearing and in no acute distress. Eyes: Conjunctivae are normal. PERRL. EOMI. Head: Atraumatic. ENT:      Ears:       Nose: No congestion/rhinnorhea.      Mouth/Throat: Mucous membranes are moist.  Neck: No stridor.  Left cervical spine tenderness to palpation along the paraspinal muscle group.  No midline tenderness.  No right-sided tenderness.  Tenderness extends into the posterior left shoulder and lateral shoulder.  No palpable abnormalities in this region.  See below note for further shoulder exam.  Radial pulse intact bilateral upper extremities.  Sensation intact and equal in all dermatomal distributions in bilateral upper extremity.  Cardiovascular: Normal rate, regular rhythm. Normal S1 and S2.  Good peripheral circulation. Respiratory: Normal respiratory effort without tachypnea or retractions. Lungs CTAB. Good air entry to the bases with no decreased or absent breath sounds. Musculoskeletal: Full range of motion to all extremities. No gross deformities appreciated.  Examination of left shoulder reveals mild tenderness over the  triangle of auscultation along the posterior aspect.  No tenderness along the anterior shoulder.  There is mild tenderness along the acromion process left shoulder.  No palpable abnormalities.  Examination of the humerus, elbow, forearm and wrist is unremarkable. Neurologic:  Normal speech and language. No gross focal neurologic deficits are appreciated.  Cranial nerves II through XII grossly intact.  Negative Romberg's and pronator drift.  Equal grip strength bilateral upper extremities.  Sensation intact and equal in all dermatomal distributions bilateral upper extremities. Skin:  Skin is warm, dry and intact. No rash noted. Psychiatric: Mood and affect are normal. Speech and behavior are normal. Patient exhibits appropriate insight and judgement.   ____________________________________________   LABS (all labs ordered are listed, but only abnormal results are displayed)  Labs Reviewed  BASIC METABOLIC PANEL - Abnormal; Notable for the following components:      Result Value   Calcium 8.7 (*)    All other components within normal limits  CBC  TROPONIN I (HIGH SENSITIVITY)  TROPONIN I (HIGH SENSITIVITY)   ____________________________________________  EKG  ED ECG REPORT I, Charline Bills Landi Biscardi,  personally viewed and interpreted this ECG.   Date: 09/02/2020  EKG Time: 1507 hrs.  Rate: 59 bpm  Rhythm: unchanged from previous tracings, sinus tachycardia, sinus bradycardia.  No significant changes from previous EKG on 09/10/2013  Axis: Normal axis  Intervals:none  ST&T Change: No ST elevation or depression noted  No STEMI.  Sinus bradycardia.  Unchanged from previous EKG from 2014.  ____________________________________________  RADIOLOGY I personally viewed and evaluated these images as part of my medical decision making, as well as reviewing the written report by the radiologist.  ED Provider Interpretation: I concur with no CVA on imaging.  No large vessel occlusion.  CT  Angio Head W or Wo Contrast  Result Date: 09/02/2020 CLINICAL DATA:  Dizziness. EXAM: CT ANGIOGRAPHY HEAD AND NECK TECHNIQUE: Multidetector CT imaging  of the head and neck was performed using the standard protocol during bolus administration of intravenous contrast. Multiplanar CT image reconstructions and MIPs were obtained to evaluate the vascular anatomy. Carotid stenosis measurements (when applicable) are obtained utilizing NASCET criteria, using the distal internal carotid diameter as the denominator. CONTRAST:  73mL OMNIPAQUE IOHEXOL 350 MG/ML SOLN COMPARISON:  None. FINDINGS: CT HEAD FINDINGS Brain: No evidence of acute infarction, hemorrhage, hydrocephalus, extra-axial collection or mass lesion/mass effect. Vascular: Negative for hyperdense vessel Skull: Negative Sinuses: Paranasal sinuses clear. Orbits: Negative Review of the MIP images confirms the above findings CTA NECK FINDINGS Aortic arch: Standard branching. Imaged portion shows no evidence of aneurysm or dissection. No significant stenosis of the major arch vessel origins. Right carotid system: Mild atherosclerotic noncalcified plaque right carotid bifurcation. No significant stenosis Left carotid system: Mild atherosclerotic calcification left carotid bifurcation without stenosis. Vertebral arteries: Both vertebral arteries widely patent to the basilar without stenosis. Skeleton: Mild degenerative changes cervical spine without acute skeletal abnormality. Other neck: Negative for mass or adenopathy. Upper chest: Moderate apical emphysema. Calcified granulomata upper lobe bilaterally. No acute abnormality. Review of the MIP images confirms the above findings CTA HEAD FINDINGS Anterior circulation: Cavernous carotid widely patent bilaterally. Anterior and middle cerebral arteries normal bilaterally. Posterior circulation: Both vertebral arteries patent to the basilar. PICA patent bilaterally. Basilar widely patent. AICA, superior cerebellar,  posterior cerebral arteries patent bilaterally. Venous sinuses: Normal venous enhancement Anatomic variants: None Review of the MIP images confirms the above findings IMPRESSION: 1. Negative CT head 2. Negative CT angio head neck. No large vessel occlusion or significant stenosis. Mild atherosclerotic disease in the carotid bifurcation bilaterally. Electronically Signed   By: Franchot Gallo M.D.   On: 09/02/2020 16:03   CT Angio Neck W and/or Wo Contrast  Result Date: 09/02/2020 CLINICAL DATA:  Dizziness. EXAM: CT ANGIOGRAPHY HEAD AND NECK TECHNIQUE: Multidetector CT imaging of the head and neck was performed using the standard protocol during bolus administration of intravenous contrast. Multiplanar CT image reconstructions and MIPs were obtained to evaluate the vascular anatomy. Carotid stenosis measurements (when applicable) are obtained utilizing NASCET criteria, using the distal internal carotid diameter as the denominator. CONTRAST:  68mL OMNIPAQUE IOHEXOL 350 MG/ML SOLN COMPARISON:  None. FINDINGS: CT HEAD FINDINGS Brain: No evidence of acute infarction, hemorrhage, hydrocephalus, extra-axial collection or mass lesion/mass effect. Vascular: Negative for hyperdense vessel Skull: Negative Sinuses: Paranasal sinuses clear. Orbits: Negative Review of the MIP images confirms the above findings CTA NECK FINDINGS Aortic arch: Standard branching. Imaged portion shows no evidence of aneurysm or dissection. No significant stenosis of the major arch vessel origins. Right carotid system: Mild atherosclerotic noncalcified plaque right carotid bifurcation. No significant stenosis Left carotid system: Mild atherosclerotic calcification left carotid bifurcation without stenosis. Vertebral arteries: Both vertebral arteries widely patent to the basilar without stenosis. Skeleton: Mild degenerative changes cervical spine without acute skeletal abnormality. Other neck: Negative for mass or adenopathy. Upper chest: Moderate  apical emphysema. Calcified granulomata upper lobe bilaterally. No acute abnormality. Review of the MIP images confirms the above findings CTA HEAD FINDINGS Anterior circulation: Cavernous carotid widely patent bilaterally. Anterior and middle cerebral arteries normal bilaterally. Posterior circulation: Both vertebral arteries patent to the basilar. PICA patent bilaterally. Basilar widely patent. AICA, superior cerebellar, posterior cerebral arteries patent bilaterally. Venous sinuses: Normal venous enhancement Anatomic variants: None Review of the MIP images confirms the above findings IMPRESSION: 1. Negative CT head 2. Negative CT angio head neck. No large vessel occlusion or significant  stenosis. Mild atherosclerotic disease in the carotid bifurcation bilaterally. Electronically Signed   By: Franchot Gallo M.D.   On: 09/02/2020 16:03    ____________________________________________    PROCEDURES  Procedure(s) performed:    Procedures    Medications  iohexol (OMNIPAQUE) 350 MG/ML injection 75 mL (75 mLs Intravenous Contrast Given 09/02/20 1532)     ____________________________________________   INITIAL IMPRESSION / ASSESSMENT AND PLAN / ED COURSE  Pertinent labs & imaging results that were available during my care of the patient were reviewed by me and considered in my medical decision making (see chart for details).  Review of the [place] CSRS was performed in accordance of the [place] prior to dispensing any controlled drugs.  Clinical Course as of Sep 02 1636  Sat Sep 02, 2020  1439 Patient presents with 2 complaints.  Patient is having intermittent numbness and tingling of the left upper extremity affecting essentially the ulnar nerve distribution of the hand.  Pain originates at the left cervical spine extending down the left upper extremity.  Intermittent in nature.  No numbness or tingling currently.  Exam was reassuring.  Patient is neurologically intact.  Patient is also having  intermittent dizziness and weakness.  He had a very atypical headache that lasted several days approximately a week to 10 days ago.  Since then he has had periods of weakness and dizziness.  He states that he had a near syncopal episode where he almost fell/passed out.  Patient states that he stumbled into the wall of his closet while he was trying to get dressed.  He did not actually lose consciousness.  He denies any weakness or dizziness currently.  Both complaints seem to occur more in the morning time.   [JC]  1440 Given the patient's complaints.  Patient will have imaging of the head and neck, troponin and EKG.  Differential includes CVA/TIA, cervical radiculopathy, STEMI/ACS.   [JC]    Clinical Course User Index [JC] Atari Novick, Charline Bills, PA-C          Patient's diagnosis is consistent with cervical radiculopathy, dizziness.  Patient presented to emergency department complaining of left arm numbness and tingling x1 month.  No reported injury.  No history of cervical radiculopathy.  This affects the ulnar distribution at the level of the hand.  Findings on physical exam are most consistent with cervical radiculopathy.  Patient had also been complaining of intermittent dizziness, weakness as well as having a very atypical headache 7 to 10 days ago which preceded his episodes of dizziness and weakness.  Differential included ACS/AMI, CVA/TIA, brain lesion, infection, dehydration.  Labs, imaging are reassuring.  No evidence of STEMI on EKG.  Troponin reassuring.  No cardiac history.  No evidence of CVA on CT angio head or neck.  No evidence of large vessel occlusion.  Remainder of labs is reassuring.  Discussed the results, differential with the patient and his wife, they will follow-up with primary care if symptoms of dizziness persist..  Patient will have anti-inflammatory, prednisone, muscle relaxer for cervical radiculopathy.  Follow-up primary care if dizziness persists.  Patient is given ED  precautions to return to the ED for any worsening or new symptoms.     ____________________________________________  FINAL CLINICAL IMPRESSION(S) / ED DIAGNOSES  Final diagnoses:  Cervical radiculopathy  Dizziness      NEW MEDICATIONS STARTED DURING THIS VISIT:  ED Discharge Orders         Ordered    meloxicam (MOBIC) 15 MG tablet  Daily  09/02/20 1635    predniSONE (DELTASONE) 50 MG tablet  Daily with breakfast        09/02/20 1635    methocarbamol (ROBAXIN) 500 MG tablet  4 times daily        09/02/20 1635              This chart was dictated using voice recognition software/Dragon. Despite best efforts to proofread, errors can occur which can change the meaning. Any change was purely unintentional.    Darletta Moll, PA-C 09/02/20 1637    Lavonia Drafts, MD 09/03/20 [phone]

## 2020-09-04 NOTE — Telephone Encounter (Signed)
Patient was evaluated in ED.

## 2020-09-05 ENCOUNTER — Other Ambulatory Visit: Payer: Self-pay

## 2020-09-05 DIAGNOSIS — R911 Solitary pulmonary nodule: Secondary | ICD-10-CM

## 2020-09-26 ENCOUNTER — Telehealth: Payer: Self-pay

## 2020-09-26 NOTE — Telephone Encounter (Signed)
Patient is due for 1 year f/u CT Chest scan and follow up with Dr Genevive Bi in December. Message left for him to call us back about a day/time preference for his CT scan.

## 2020-12-15 ENCOUNTER — Encounter: Payer: BC Managed Care – PPO | Admitting: Family Medicine

## 2021-01-04 ENCOUNTER — Telehealth: Payer: Self-pay

## 2021-01-04 DIAGNOSIS — R911 Solitary pulmonary nodule: Secondary | ICD-10-CM

## 2021-01-04 NOTE — Telephone Encounter (Signed)
  Left message for patient to return call to office.   Scheduled 1 yr CT chest w/o contrast on 12/20/20 2 4:30 at [facility]. Patient will be notified of results.

## 2021-01-09 ENCOUNTER — Encounter: Payer: Self-pay | Admitting: Family Medicine

## 2021-01-09 ENCOUNTER — Ambulatory Visit (INDEPENDENT_AMBULATORY_CARE_PROVIDER_SITE_OTHER): Payer: BC Managed Care – PPO | Admitting: Family Medicine

## 2021-01-09 ENCOUNTER — Other Ambulatory Visit: Payer: Self-pay

## 2021-01-09 VITALS — BP 118/78 | HR 75 | Temp 97.7°F | Ht 70.0 in | Wt 159.0 lb

## 2021-01-09 DIAGNOSIS — R911 Solitary pulmonary nodule: Secondary | ICD-10-CM

## 2021-01-09 DIAGNOSIS — H579 Unspecified disorder of eye and adnexa: Secondary | ICD-10-CM | POA: Diagnosis not present

## 2021-01-09 DIAGNOSIS — Z23 Encounter for immunization: Secondary | ICD-10-CM

## 2021-01-09 DIAGNOSIS — Z130 Encounter for screening for diseases of the blood and blood-forming organs and certain disorders involving the immune mechanism: Secondary | ICD-10-CM

## 2021-01-09 DIAGNOSIS — F17219 Nicotine dependence, cigarettes, with unspecified nicotine-induced disorders: Secondary | ICD-10-CM

## 2021-01-09 DIAGNOSIS — Z0001 Encounter for general adult medical examination with abnormal findings: Secondary | ICD-10-CM

## 2021-01-09 DIAGNOSIS — Z125 Encounter for screening for malignant neoplasm of prostate: Secondary | ICD-10-CM

## 2021-01-09 DIAGNOSIS — J432 Centrilobular emphysema: Secondary | ICD-10-CM

## 2021-01-09 DIAGNOSIS — Z13220 Encounter for screening for lipoid disorders: Secondary | ICD-10-CM

## 2021-01-09 DIAGNOSIS — Z1329 Encounter for screening for other suspected endocrine disorder: Secondary | ICD-10-CM

## 2021-01-09 LAB — LIPID PANEL
Cholesterol: 162 mg/dL (ref 0–200)
HDL: 61.1 mg/dL (ref >39.00)
LDL Cholesterol: 88 mg/dL (ref 0–99)
NonHDL: 101.17
Total CHOL/HDL Ratio: 3
Triglycerides: 67 mg/dL (ref 0.0–149.0)
VLDL: 13.4 mg/dL (ref 0.0–40.0)

## 2021-01-09 LAB — COMPREHENSIVE METABOLIC PANEL
ALT: 15 U/L (ref 0–53)
AST: 12 U/L (ref 0–37)
Albumin: 4.3 g/dL (ref 3.5–5.2)
Alkaline Phosphatase: 50 U/L (ref 39–117)
BUN: 24 mg/dL — ABNORMAL HIGH (ref 6–23)
CO2: 26 mEq/L (ref 19–32)
Calcium: 9.2 mg/dL (ref 8.4–10.5)
Chloride: 103 mEq/L (ref 96–112)
Creatinine, Ser: 1.04 mg/dL (ref 0.40–1.50)
GFR: 77.48 mL/min (ref >60.00)
Glucose, Bld: 95 mg/dL (ref 70–99)
Potassium: 4.6 mEq/L (ref 3.5–5.1)
Sodium: 137 mEq/L (ref 135–145)
Total Bilirubin: 0.5 mg/dL (ref 0.2–1.2)
Total Protein: 6.4 g/dL (ref 6.0–8.3)

## 2021-01-09 LAB — CBC WITH DIFFERENTIAL/PLATELET
Basophils Absolute: 0.1 10*3/uL (ref 0.0–0.1)
Basophils Relative: 1.1 % (ref 0.0–3.0)
Eosinophils Absolute: 0.1 10*3/uL (ref 0.0–0.7)
Eosinophils Relative: 2.3 % (ref 0.0–5.0)
HCT: 46.5 % (ref 39.0–52.0)
Hemoglobin: 15.7 g/dL (ref 13.0–17.0)
Lymphocytes Relative: 33.2 % (ref 12.0–46.0)
Lymphs Abs: 1.9 10*3/uL (ref 0.7–4.0)
MCHC: 33.8 g/dL (ref 30.0–36.0)
MCV: 96 fl (ref 78.0–100.0)
Monocytes Absolute: 0.4 10*3/uL (ref 0.1–1.0)
Monocytes Relative: 7.8 % (ref 3.0–12.0)
Neutro Abs: 3.2 10*3/uL (ref 1.4–7.7)
Neutrophils Relative %: 55.6 % (ref 43.0–77.0)
Platelets: 238 10*3/uL (ref 150.0–400.0)
RBC: 4.84 Mil/uL (ref 4.22–5.81)
RDW: 13.2 % (ref 11.5–15.5)
WBC: 5.7 10*3/uL (ref 4.0–10.5)

## 2021-01-09 LAB — TSH: TSH: 1.26 u[IU]/mL (ref 0.35–4.50)

## 2021-01-09 LAB — PSA: PSA: 1.6 ng/mL (ref 0.10–4.00)

## 2021-01-09 MED ORDER — VARENICLINE TARTRATE 0.5 MG X 11 & 1 MG X 42 PO MISC: ORAL | 0 refills | Status: DC

## 2021-01-09 MED ORDER — ALBUTEROL SULFATE HFA 108 (90 BASE) MCG/ACT IN AERS: 2.0000 | INHALATION_SPRAY | Freq: Four times a day (QID) | RESPIRATORY_TRACT | 0 refills | Status: DC | PRN

## 2021-01-09 MED ORDER — BREO ELLIPTA 100-25 MCG/INH IN AEPB: 1.0000 | INHALATION_SPRAY | Freq: Every day | RESPIRATORY_TRACT | 11 refills | Status: DC

## 2021-01-09 NOTE — Assessment & Plan Note (Signed)
He will continue to follow with thoracic surgery for this.  He has an upcoming CT scan.

## 2021-01-09 NOTE — Assessment & Plan Note (Signed)
This appears to be an ongoing issue based on history provided by the patient.  Review of a picture from 11 days ago does reveal that the size difference in pupils was present at that time.  We will request records from his eye doctor.  He is overdue for an eye exam and I advised that they contact the eye doctor today to get something set up to evaluate.

## 2021-01-09 NOTE — Progress Notes (Signed)
Tommi Rumps, MD Phone: [phone]  Paul Fields is a 60 y.o. male who presents today for CPE.  Diet: generally healthy, plenty of lean meats and vegetables Exercise: very active job Colonoscopy: 11/13/18, 3 year recal Prostate cancer screening: due Family history-  Prostate cancer: no  Colon cancer: brother, uncle Vaccines-   Flu: declines  Tetanus: UTD  Shingles: due  COVID19: 2 shots, declines booster  Pneumonia: due for PNA20 HIV screening: UTD Hep C Screening: completed Tobacco use: 3/4 PPD, wants to quit Alcohol use: former use, none since 8088 Illicit Drug use: no Ophthalmology: due to see, patient notes chronic issues with his left eye.  These have been going on for most of his life.  He was noted to have a fixed left pupil on exam.  Review of the picture from 11 days ago reveals this was present at that time as well.  COPD: Patient has ongoing issues with this.  He did see pulmonology previously and they recommended to use Breo though he never started on this.  He has chronic cough and dyspnea.  He notes his wheezing has worsened recently.  He is ready to quit smoking.  He has taken Chantix in the past with good benefit though did have an issue with significant anger one of the times he took it though he took it again after that and had no issues.  He believes he had been drinking alcohol at the time that he had the anger issues.  He has also tried gums and patches with no benefit.   Active Ambulatory Problems    Diagnosis Date Noted  . Anxiety 02/21/2012  . Screening for colon cancer 05/27/2012  . Tobacco use 05/27/2012  . Hx of adenomatous colonic polyps 08/11/2012  . Personal history of tobacco use, presenting hazards to health 03/14/2016  . Nodule of right lung 04/04/2016  . Foot pain, right 11/26/2016  . Hoarseness of voice 11/26/2016  . Neck pain 11/26/2016  . Centrilobular emphysema ([place]) 01/14/2018  . Hyperpigmented skin lesion 01/14/2018  . GERD  (gastroesophageal reflux disease) 04/24/2018  . Encounter for general adult medical examination with abnormal findings 12/19/2019  . Chronic pain of left thumb 12/19/2019  . Fixed pupil of left eye 01/09/2021   Resolved Ambulatory Problems    Diagnosis Date Noted  . Otitis media, right 02/21/2012  . General medical exam 05/27/2012  . Ecchymoses, spontaneous 05/27/2012  . Microscopic hematuria 06/01/2013  . Routine general medical examination at a health care facility 03/07/2016  . Major depressive disorder, recurrent episode ([place]) 03/07/2016   Past Medical History:  Diagnosis Date  . Arthritis   . Clavicle fracture 1986  . COPD (chronic obstructive pulmonary disease) ([place])   . Diverticulosis   . Elbow fracture, right 1968  . Emphysema lung ([place])   . Hemorrhoid   . Polyp of colon, adenomatous     Family History  Problem Relation Age of Onset  . COPD Father   . Arthritis Father   . Parkinsonism Mother   . Diabetes Mother        borderline  . Lung cancer Mother   . Cancer Mother        Lung  . Colon cancer Maternal Uncle        diagnosed in his 7s  . Alzheimer's disease Paternal Aunt   . COPD Paternal Uncle   . Lung cancer Maternal Grandfather        Ecologist  . Prostate cancer Maternal Uncle  diagnosed in his 3s  . Alzheimer's disease Paternal Aunt   . Heart attack Paternal Uncle   . Brain cancer Paternal Uncle 36  . Lymphoma Cousin        paternal cousin  . COPD Brother        prostate  . Cancer Paternal Grandfather        lung  . Heart disease Neg Hx   . Stomach cancer Neg Hx   . Rectal cancer Neg Hx   . Colon polyps Neg Hx   . Esophageal cancer Neg Hx     Social History   Socioeconomic History  . Marital status: Married    Spouse name: Not on file  . Number of children: 3  . Years of education: Not on file  . Highest education level: Not on file  Occupational History  . Occupation: truck Education administrator: [employer]  Tobacco Use  .  Smoking status: Current Every Day Smoker    Packs/day: 1.50    Years: 38.00    Pack years: 57.00    Types: Cigarettes    Last attempt to quit: 04/16/2016    Years since quitting: 4.7  . Smokeless tobacco: Never Used  Vaping Use  . Vaping Use: Never used  Substance and Sexual Activity  . Alcohol use: No    Comment: History of 6 pk beer on weekends- Last [facility] 11/2015  . Drug use: No  . Sexual activity: Not on file  Other Topics Concern  . Not on file  Social History Narrative  . Not on file   Social Determinants of Health   Financial Resource Strain: Not on file  Food Insecurity: Not on file  Transportation Needs: Not on file  Physical Activity: Not on file  Stress: Not on file  Social Connections: Not on file  Intimate Partner Violence: Not on file    ROS  General:  Negative for nexplained weight loss, fever Skin: Negative for new or changing mole, sore that won't heal HEENT: Negative for trouble hearing, trouble seeing, ringing in ears, mouth sores, hoarseness, change in voice, dysphagia. CV: Positive for chronic dyspnea, negative for chest pain, edema, palpitations Resp: Positive for chronic cough, dyspnea, negative for hemoptysis GI: Negative for nausea, vomiting, diarrhea, constipation, abdominal pain, melena, hematochezia. GU: Negative for dysuria, incontinence, urinary hesitance, hematuria, vaginal or penile discharge, polyuria, sexual difficulty, lumps in testicle or breasts MSK: Negative for muscle cramps or aches, joint pain or swelling Neuro: Negative for headaches, weakness, numbness, dizziness, passing out/fainting Psych: Negative for depression, anxiety, memory problems  Objective  Physical Exam Vitals:   01/09/21 0909  BP: 118/78  Pulse: 75  Temp: 97.7 F (36.5 C)  SpO2: 98%    BP Readings from Last 3 Encounters:  01/09/21 118/78  09/02/20 113/78  12/14/19 118/70   Wt Readings from Last 3 Encounters:  01/09/21 159 lb (72.1 kg)  09/02/20 165 lb  (74.8 kg)  12/14/19 164 lb 6.4 oz (74.6 kg)    Physical Exam Constitutional:      General: He is not in acute distress.    Appearance: He is not diaphoretic.  HENT:     Head: Normocephalic and atraumatic.  Eyes:     Extraocular Movements: Extraocular movements intact.     Comments: Right pupil is reactive to light, left pupil is small in size though larger than pinpoint and is fixed  Cardiovascular:     Rate and Rhythm: Normal rate and regular rhythm.  Heart sounds: Normal heart sounds.  Pulmonary:     Effort: Pulmonary effort is normal.     Breath sounds: Normal breath sounds.  Abdominal:     General: Bowel sounds are normal. There is no distension.     Palpations: Abdomen is soft.     Tenderness: There is no abdominal tenderness. There is no guarding or rebound.  Musculoskeletal:     Right lower leg: No edema.     Left lower leg: No edema.  Skin:    General: Skin is warm and dry.  Neurological:     Mental Status: He is alert.     Comments: Visual field intact bilaterally, sensation intact light touch V1 through V3 bilaterally, hearing intact bilaterally, shoulder shrug intact, tongue is midline on extension out of mouth, palate raises acceptably, 5/5 strength in bilateral biceps, triceps, grip, quads, hamstrings, plantar and dorsiflexion, sensation to light touch intact in bilateral UE and LE, normal gait  Psychiatric:        Mood and Affect: Mood normal.      Assessment/Plan:   Problem List Items Addressed This Visit    Centrilobular emphysema ([place])    Patient with chronic issues with this.  Worsened recently with increased wheezing.  Shortness of breath and cough have been stable for some time.  We will start him on Breo.  He was provided with an albuterol inhaler prescription as well for rescue inhaler.  He will come back in 3 months.      Relevant Medications   varenicline (CHANTIX PAK) 0.5 MG X 11 & 1 MG X 42 tablet   fluticasone furoate-vilanterol (BREO  ELLIPTA) 100-25 MCG/INH AEPB   albuterol (VENTOLIN HFA) 108 (90 Base) MCG/ACT inhaler   Encounter for general adult medical examination with abnormal findings - Primary    Physical exam completed.  Encouraged healthy diet and activity level.  Prostate cancer screening with PSA completed.  Colonoscopy is up-to-date.  Patient was given his first Shingrix vaccine.  He was given the pneumonia 20 vaccine.  He declines Covid booster and flu vaccine.  Discussed smoking cessation at length with the patient.  He will trial Chantix.  Advised that they need to contact us immediately if he develops any psychiatric issues or anger issues or odd dreams.  Lab work as outlined.      Fixed pupil of left eye    This appears to be an ongoing issue based on history provided by the patient.  Review of a picture from 11 days ago does reveal that the size difference in pupils was present at that time.  We will request records from his eye doctor.  He is overdue for an eye exam and I advised that they contact the eye doctor today to get something set up to evaluate.      Nodule of right lung    He will continue to follow with thoracic surgery for this.  He has an upcoming CT scan.       Other Visit Diagnoses    Lipid screening       Relevant Orders   Comp Met (CMET)   Lipid panel   Thyroid disorder screen       Relevant Orders   TSH   Screening for deficiency anemia       Relevant Orders   CBC w/Diff   Prostate cancer screening       Relevant Orders   PSA   Nicotine dependence, cigarettes, w unsp disorders  Relevant Medications   varenicline (CHANTIX PAK) 0.5 MG X 11 & 1 MG X 42 tablet   Need for shingles vaccine       Relevant Orders   Varicella-zoster vaccine subcutaneous (Completed)   Need for pneumococcal vaccination       Relevant Orders   Pneumococcal conjugate vaccine 20-valent (Prevnar 20) (Completed)      This visit occurred during the SARS-CoV-2 public health emergency.  Safety  protocols were in place, including screening questions prior to the visit, additional usage of staff PPE, and extensive cleaning of exam room while observing appropriate contact time as indicated for disinfecting solutions.    Tommi Rumps, MD [place]

## 2021-01-09 NOTE — Assessment & Plan Note (Signed)
Patient with chronic issues with this.  Worsened recently with increased wheezing.  Shortness of breath and cough have been stable for some time.  We will start him on Breo.  He was provided with an albuterol inhaler prescription as well for rescue inhaler.  He will come back in 3 months.

## 2021-01-09 NOTE — Assessment & Plan Note (Signed)
Physical exam completed.  Encouraged healthy diet and activity level.  Prostate cancer screening with PSA completed.  Colonoscopy is up-to-date.  Patient was given his first Shingrix vaccine.  He was given the pneumonia 20 vaccine.  He declines Covid booster and flu vaccine.  Discussed smoking cessation at length with the patient.  He will trial Chantix.  Advised that they need to contact us immediately if he develops any psychiatric issues or anger issues or odd dreams.  Lab work as outlined.

## 2021-01-09 NOTE — Patient Instructions (Signed)
Nice to see you. Please continue with healthy diet. We will try a Breo inhaler and albuterol to use as a rescue inhaler. Please try the Chantix.  If you notice any issues with depression, anxiety, anger, or abnormal dreams please discontinue the medicine and let us know. We will give you a shingles vaccine and pneumonia vaccine. Please see your eye doctor.

## 2021-01-11 LAB — HM DIABETES EYE EXAM

## 2021-01-16 ENCOUNTER — Other Ambulatory Visit: Payer: Self-pay | Admitting: Family Medicine

## 2021-01-16 DIAGNOSIS — R972 Elevated prostate specific antigen [PSA]: Secondary | ICD-10-CM

## 2021-01-17 ENCOUNTER — Other Ambulatory Visit: Payer: Self-pay

## 2021-01-17 ENCOUNTER — Ambulatory Visit
Admission: RE | Admit: 2021-01-17 | Discharge: 2021-01-17 | Disposition: A | Discharge status: Home / Self Care | Payer: BC Managed Care – PPO | Source: Ambulatory Visit | Attending: Surgery | Admitting: Surgery

## 2021-01-17 DIAGNOSIS — R911 Solitary pulmonary nodule: Secondary | ICD-10-CM | POA: Insufficient documentation

## 2021-01-24 ENCOUNTER — Telehealth: Payer: Self-pay

## 2021-01-29 MED ORDER — VARENICLINE TARTRATE 0.5 MG PO TABS: ORAL_TABLET | ORAL | 0 refills | Status: DC

## 2021-01-29 NOTE — Telephone Encounter (Signed)
Sent to pharmacy 

## 2021-02-01 ENCOUNTER — Telehealth: Payer: Self-pay

## 2021-02-01 MED ORDER — VARENICLINE TARTRATE 0.5 MG PO TABS: ORAL_TABLET | ORAL | 0 refills | Status: DC

## 2021-02-01 NOTE — Telephone Encounter (Signed)
I sent it in again. It is listed as the generic in our system. I do not know what to do for this further if this does not go through appropriately.

## 2021-02-07 ENCOUNTER — Ambulatory Visit (INDEPENDENT_AMBULATORY_CARE_PROVIDER_SITE_OTHER): Payer: BC Managed Care – PPO | Admitting: Pulmonary Disease

## 2021-02-07 ENCOUNTER — Encounter: Payer: Self-pay | Admitting: Pulmonary Disease

## 2021-02-07 ENCOUNTER — Other Ambulatory Visit: Payer: Self-pay

## 2021-02-07 VITALS — BP 128/60 | HR 67 | Temp 97.5°F | Ht 67.6 in | Wt 157.8 lb

## 2021-02-07 DIAGNOSIS — J449 Chronic obstructive pulmonary disease, unspecified: Secondary | ICD-10-CM | POA: Diagnosis not present

## 2021-02-07 DIAGNOSIS — F17210 Nicotine dependence, cigarettes, uncomplicated: Secondary | ICD-10-CM | POA: Diagnosis not present

## 2021-02-07 DIAGNOSIS — R911 Solitary pulmonary nodule: Secondary | ICD-10-CM | POA: Diagnosis not present

## 2021-02-07 MED ORDER — TRELEGY ELLIPTA 100-62.5-25 MCG/INH IN AEPB: 100.0000 ug | INHALATION_SPRAY | Freq: Every day | RESPIRATORY_TRACT | 0 refills | Status: DC

## 2021-02-07 MED ORDER — VARENICLINE TARTRATE 0.5 MG X 11 & 1 MG X 42 PO MISC: ORAL | 0 refills | Status: DC

## 2021-02-07 MED ORDER — VARENICLINE TARTRATE 0.5 MG PO TABS: ORAL_TABLET | ORAL | 0 refills | Status: DC

## 2021-02-07 NOTE — Patient Instructions (Signed)
We have sent your Chantix (Varenicicline) prescription to [facility]  We are giving you a trial of Trelegy Ellipta, 1 puff daily.  Let us know how Trelegy works for you so we can call the prescription in for you.  DO NOT TAKE BREO WHILE TAKING THE TRELEGY.  You will get a CT scan of the chest in 6 months.  We will see you in follow-up in 3 months time call sooner if difficulties arise.

## 2021-02-07 NOTE — Progress Notes (Signed)
Subjective:    Patient ID: Paul Fields, male    DOB: 17-Mar-1961, 60 y.o.   MRN: [id number]  HPI Patient is a 60 year old current smoker (1 PPD) who presents for follow-up of COPD and right upper lobe lung nodule.  Was last seen 09 November 2019 at that time was given a trial of Breo Ellipta and he remains on this medication.  He notes Memory Dance helps but not optimally.  He notes increasing shortness of breath when he works.  Also notes cough productive of yellowish sputum and frequent wheezing.  He continues to smoke 1 pack of cigarettes per day as noted above.  He states that previously he had quit on Chantix but had relapse.  He would like to try Chantix again but has had difficulties with the prescription sent by his primary physician to the pharmacy.  He is not sure what the issue is.  He would like a prescription for Chantix.  There have been some recalls on the medication and this may be the issue.  He does not endorse any fevers, chills or sweats.  No chest pain, no orthopnea or paroxysmal nocturnal dyspnea.  Most recently he had a chest CT performed on 23 March, this was reviewed with him and his wife.  It shows a 0.6 cm posteromedial right upper lobe pulmonary nodule, unchanged in size from prior though perhaps slightly more solid.  No other complaints voiced.   Review of Systems A 10 point review of systems was performed and it is as noted above otherwise negative.  Patient Active Problem List   Diagnosis Date Noted  . Fixed pupil of left eye 01/09/2021  . Encounter for general adult medical examination with abnormal findings 12/19/2019  . Chronic pain of left thumb 12/19/2019  . GERD (gastroesophageal reflux disease) 04/24/2018  . Centrilobular emphysema ([place]) 01/14/2018  . Hyperpigmented skin lesion 01/14/2018  . Foot pain, right 11/26/2016  . Hoarseness of voice 11/26/2016  . Neck pain 11/26/2016  . Nodule of right lung 04/04/2016  . Personal history of tobacco use,  presenting hazards to health 03/14/2016  . Hx of adenomatous colonic polyps 08/11/2012  . Screening for colon cancer 05/27/2012  . Tobacco use 05/27/2012  . Anxiety 02/21/2012   Social History   Tobacco Use  . Smoking status: Current Every Day Smoker    Packs/day: 1.00    Years: 38.00    Pack years: 38.00    Types: Cigarettes  . Smokeless tobacco: Never Used  . Tobacco comment: currently smoking 1 pack a day.  Substance Use Topics  . Alcohol use: No    Comment: History of 6 pk beer on weekends- Last [facility] 11/2015   No Known Allergies   Current Meds  Medication Sig  . albuterol (VENTOLIN HFA) 108 (90 Base) MCG/ACT inhaler Inhale 2 puffs into the lungs every 6 (six) hours as needed for wheezing or shortness of breath.  . fluticasone furoate-vilanterol (BREO ELLIPTA) 100-25 MCG/INH AEPB Inhale 1 puff into the lungs daily.   Immunization History  Administered Date(s) Administered  . Influenza,inj,Quad PF,6+ Mos 09/10/2013  . PFIZER Comirnaty([place])Covid-19 Tri-Sucrose Vaccine 02/06/2020, 02/27/2020  . PNEUMOCOCCAL CONJUGATE-20 01/09/2021  . Pneumococcal Polysaccharide-23 05/27/2012  . Tdap 05/27/2012  . Zoster 01/09/2021       Objective:   Physical Exam BP 128/60 (BP Location: Left Arm, Patient Position: Sitting, Cuff Size: Normal)   Pulse 67   Temp (!) 97.5 F (36.4 C) (Temporal)   Ht 5' 7.6" (1.717 m)  Wt 157 lb 12.8 oz (71.6 kg)   SpO2 97%   BMI 24.28 kg/m ' GENERAL: Thin well-developed gentleman in no acute distress, fully ambulatory.  No conversational dyspnea. HEAD: Normocephalic, atraumatic.  EYES: Pupils equal, round, reactive to light.  No scleral icterus.  MOUTH: Nose/mouth/throat not examined due to masking requirements for COVID 19. NECK: Supple. No thyromegaly. Trachea midline. No JVD.  No adenopathy. PULMONARY: Good air entry bilaterally.  Coarse, no other adventitious sounds. CARDIOVASCULAR: S1 and S2. Regular rate and rhythm.  No rubs, murmurs or  gallops heard. ABDOMEN: Benign. MUSCULOSKELETAL: No joint deformity, no clubbing, no edema.  NEUROLOGIC: No focal deficit, no gait disturbance, speech is fluent. SKIN: Intact,warm,dry.  On limited exam no rashes. PSYCH: Mood and behavior normal  Representative slice from CT scan of the chest performed 17 January 2021 and independently reviewed:     Assessment & Plan:     ICD-10-CM   1. COPD, moderate ([place])  J44.9 Pulmonary Function Test [facility] Only    Fluticasone-Umeclidin-Vilant (TRELEGY ELLIPTA) 100-62.5-25 MCG/INH AEPB   Trial of Trelegy Ellipta 100/62.5/25 STOP SMOKING Continue as needed albuterol  2. Lung nodule  R91.1 CT CHEST WO CONTRAST   Too small to characterize by PET/CT Too small to safely biopsy at present Will need to be followed expectantly  3. Tobacco dependence due to cigarettes  F17.210    Patient was counseled regards discontinuation of smoking Generic Chantix per his request    Orders Placed This Encounter  Procedures  . CT CHEST WO CONTRAST    Add thin slices    Standing Status:   Future    Standing Expiration Date:   02/07/2022    Scheduling Instructions:     6 months    Order Specific Question:   Preferred imaging location?    Answer:   [facility]  . Pulmonary Function Test [facility] Only    Standing Status:   Future    Standing Expiration Date:   02/07/2022    Scheduling Instructions:     Next available    Order Specific Question:   Full PFT: includes the following: basic spirometry, spirometry pre & post bronchodilator, diffusion capacity (DLCO), lung volumes    Answer:   Full PFT    Order Specific Question:   This test can only be performed at    Answer:   [place] ordered this encounter  Medications  . Fluticasone-Umeclidin-Vilant (TRELEGY ELLIPTA) 100-62.5-25 MCG/INH AEPB    Sig: Inhale 100 mcg into the lungs daily.    Dispense:  28 each    Refill:  0    Order Specific Question:   Lot Number?    Answer:   Dimas Chyle    Order  Specific Question:   Expiration Date?    Answer:   10/19/2022  . DISCONTD: varenicline (CHANTIX PAK) 0.5 MG X 11 & 1 MG X 42 tablet    Sig: Take one 0.5 mg tablet by mouth once daily for 3 days, then increase to one 0.5 mg tablet twice daily for 4 days, then increase to one 1 mg tablet twice daily.    Dispense:  53 tablet    Refill:  0  . varenicline (CHANTIX) 0.5 MG tablet    Sig: 1 tablet daily by mouth for 3 days then 1 tablet twice a day for 4 days then 2 tablets twice a day thereon    Dispense:  90 tablet    Refill:  0   Discussion:  The patient's small pulmonary nodule noted is too small for further characterization by PET CT also too small to safely biopsy at present.  Will need to continue follow-up.  The nodule has continued to be the same size perhaps with mild increase in density.  He will need follow-up chest CT in 6 months time.  Patient has been on [facility] but still notes some difficulties with shortness of breath.  Unfortunately he continues to smoke.  We will give him a trial of Trelegy Ellipta 1 inhalation daily.  This is 100/62.5/25.  Patient is to let us know how this is working for him so this can be called into his pharmacy.  A prescription of generic Chantix was sent to his pharmacy at his request.  We discussed strategies to quit smoking.  We will see the patient in follow-up in 3 months time he is to contact us prior to that time should any new difficulties arise   C. Derrill Kay, MD [facility]   *This note was dictated using voice recognition software/Dragon.  Despite best efforts to proofread, errors can occur which can change the meaning.  Any change was purely unintentional.

## 2021-02-12 ENCOUNTER — Encounter: Payer: Self-pay | Admitting: Pulmonary Disease

## 2021-02-16 ENCOUNTER — Telehealth: Payer: Self-pay | Admitting: Pulmonary Disease

## 2021-02-16 DIAGNOSIS — J449 Chronic obstructive pulmonary disease, unspecified: Secondary | ICD-10-CM

## 2021-02-16 MED ORDER — TRELEGY ELLIPTA 100-62.5-25 MCG/INH IN AEPB: 100.0000 ug | INHALATION_SPRAY | Freq: Every day | RESPIRATORY_TRACT | 11 refills | Status: DC

## 2021-02-16 NOTE — Telephone Encounter (Signed)
Rx for Trelegy 100 has been sent to preferred pharmacy, as patient feels that this medication is effective.  Patient is aware and voiced his understanding.  Nothing further needed at this time.

## 2021-03-01 ENCOUNTER — Telehealth: Payer: Self-pay | Admitting: Family Medicine

## 2021-03-01 DIAGNOSIS — R972 Elevated prostate specific antigen [PSA]: Secondary | ICD-10-CM

## 2021-03-01 NOTE — Telephone Encounter (Signed)
Please get the patient scheduled for a lab appointment 2 days prior to my visit with him in June and include in the appointment notes for his June appointment to send the PSA results to Dr Jeffie Pollock of urology. Thanks.

## 2021-03-01 NOTE — Telephone Encounter (Signed)
LMTCB to get patient scheduled for labs a few days prior to 6/21 appointment. I have already added to appointment notes to send lab results to Dr. Jeffie Pollock.

## 2021-03-01 NOTE — Telephone Encounter (Signed)
-----   Message from Irine Seal, MD sent at 02/28/2021 11:03 AM EDT ----- I am seeing Paul Fields for his PSA bump.  His prostate exam is ok.  He may have had COVID in January and I have seen that bump the PSA.   He tells me he is seeing you again in June and I would like to get a repeat PSA prior to his visit with you on 6/21 and then have the results sent to me.  Hopefully it will come on back down.     Thanks.

## 2021-04-11 ENCOUNTER — Telehealth: Payer: Self-pay

## 2021-04-11 NOTE — Telephone Encounter (Signed)
Lm for reminder of covid test prior to PFT.  04/16/21 10:[facility]

## 2021-04-11 NOTE — Telephone Encounter (Signed)
Patient informed of Covid 19 test appointment.

## 2021-04-16 ENCOUNTER — Other Ambulatory Visit
Admission: RE | Admit: 2021-04-16 | Discharge: 2021-04-16 | Disposition: A | Discharge status: Home / Self Care | Payer: BC Managed Care – PPO | Source: Ambulatory Visit | Attending: Pulmonary Disease | Admitting: Pulmonary Disease

## 2021-04-16 ENCOUNTER — Other Ambulatory Visit (INDEPENDENT_AMBULATORY_CARE_PROVIDER_SITE_OTHER): Payer: BC Managed Care – PPO

## 2021-04-16 ENCOUNTER — Other Ambulatory Visit: Payer: Self-pay

## 2021-04-16 DIAGNOSIS — Z01812 Encounter for preprocedural laboratory examination: Secondary | ICD-10-CM | POA: Insufficient documentation

## 2021-04-16 DIAGNOSIS — Z20822 Contact with and (suspected) exposure to covid-19: Secondary | ICD-10-CM | POA: Insufficient documentation

## 2021-04-16 DIAGNOSIS — R972 Elevated prostate specific antigen [PSA]: Secondary | ICD-10-CM

## 2021-04-16 LAB — PSA: PSA: 0.95 ng/mL (ref 0.10–4.00)

## 2021-04-16 LAB — SARS CORONAVIRUS 2 (TAT 6-24 HRS): SARS Coronavirus 2: NEGATIVE

## 2021-04-17 ENCOUNTER — Ambulatory Visit: Payer: BC Managed Care – PPO | Admitting: Family Medicine

## 2021-04-17 ENCOUNTER — Ambulatory Visit: Payer: BC Managed Care – PPO | Attending: Pulmonary Disease

## 2021-04-17 ENCOUNTER — Encounter: Payer: Self-pay | Admitting: Family Medicine

## 2021-04-17 VITALS — BP 121/79 | HR 76 | Temp 98.0°F | Ht 67.0 in | Wt 158.8 lb

## 2021-04-17 DIAGNOSIS — F17210 Nicotine dependence, cigarettes, uncomplicated: Secondary | ICD-10-CM

## 2021-04-17 DIAGNOSIS — H579 Unspecified disorder of eye and adnexa: Secondary | ICD-10-CM | POA: Diagnosis not present

## 2021-04-17 DIAGNOSIS — Z23 Encounter for immunization: Secondary | ICD-10-CM

## 2021-04-17 DIAGNOSIS — J449 Chronic obstructive pulmonary disease, unspecified: Secondary | ICD-10-CM | POA: Diagnosis present

## 2021-04-17 DIAGNOSIS — J432 Centrilobular emphysema: Secondary | ICD-10-CM | POA: Diagnosis not present

## 2021-04-17 MED ORDER — ALBUTEROL SULFATE (2.5 MG/3ML) 0.083% IN NEBU
2.5000 mg | INHALATION_SOLUTION | Freq: Once | RESPIRATORY_TRACT | Status: AC
  Administered 2021-04-17: 2.5 mg via RESPIRATORY_TRACT
  Filled 2021-04-17: qty 3

## 2021-04-17 NOTE — Progress Notes (Signed)
Paul Rumps, MD Phone: [phone]  Paul Fields is a 60 y.o. male who presents today for f/u.  COPD: Medication compliance- taking trelegy  Rescue inhaler use- none recently Dyspnea- chronic and stable  Wheezing- chronic and stable  Cough- chronic and stable  Productive- no  Tobacco use: He was unable to tolerate the Chantix.  It made him dizzy.  He stopped it and the dizziness resolved.  He smoking 1.5 packs/day.  Fixed pupil: He saw his eye doctor and per patient report was advised that this was a chronic issue and this was how his eye was.  Social History   Tobacco Use  Smoking Status Every Day   Packs/day: 1.50   Years: 38.00   Pack years: 57.00   Types: Cigarettes  Smokeless Tobacco Never    Current Outpatient Medications on File Prior to Visit  Medication Sig Dispense Refill   albuterol (VENTOLIN HFA) 108 (90 Base) MCG/ACT inhaler Inhale 2 puffs into the lungs every 6 (six) hours as needed for wheezing or shortness of breath. 8 g 0   Fluticasone-Umeclidin-Vilant (TRELEGY ELLIPTA) 100-62.5-25 MCG/INH AEPB Inhale 100 mcg into the lungs daily. 60 each 11   No current facility-administered medications on file prior to visit.     ROS see history of present illness  Objective  Physical Exam Vitals:   04/17/21 0906  BP: 121/79  Pulse: 76  Temp: 98 F (36.7 C)  SpO2: 97%    BP Readings from Last 3 Encounters:  04/17/21 121/79  02/07/21 128/60  01/09/21 118/78   Wt Readings from Last 3 Encounters:  04/17/21 158 lb 12.8 oz (72 kg)  02/07/21 157 lb 12.8 oz (71.6 kg)  01/09/21 159 lb (72.1 kg)    Physical Exam Constitutional:      General: He is not in acute distress.    Appearance: He is not diaphoretic.  Cardiovascular:     Rate and Rhythm: Normal rate and regular rhythm.     Heart sounds: Normal heart sounds.  Pulmonary:     Effort: Pulmonary effort is normal.     Breath sounds: Normal breath sounds.  Skin:    General: Skin is warm and  dry.  Neurological:     Mental Status: He is alert.     Assessment/Plan: Please see individual problem list.  Problem List Items Addressed This Visit     Nicotine dependence, cigarettes, uncomplicated    Smoking cessation counseling was provided.  Approximately 3 minutes were spent discussing the rationale for tobacco cessation and strategies for doing so.  Adjuncts, including nicotine patches and nicotine lozenges were recommended.  Patient was advised he can call the 1 800 quit now line to get patches.  If he would like me to prescribe the patches he will let me know.  Follow-up 3 months.        Centrilobular emphysema (HCC)    Generally stable.  Trelegy has not seemed to make much of a difference.  He has PFTs scheduled for today.  He will continue Trelegy for now and can use his albuterol as needed.  He will see pulmonology as planned as well.       Fixed pupil of left eye    He has seen his eye doctor and had a evaluation for this.  Reports this was noted to be a chronic issue and nothing wrong was found.       Other Visit Diagnoses     Need for shingles vaccine    -  Primary   Relevant Orders   Varicella-zoster vaccine IM (Shingrix) (Completed)   Varicella-zoster vaccine IM (Shingrix) (Completed)       Return in about 3 months (around 07/18/2021) for Tobacco cessation.  This visit occurred during the SARS-CoV-2 public health emergency.  Safety protocols were in place, including screening questions prior to the visit, additional usage of staff PPE, and extensive cleaning of exam room while observing appropriate contact time as indicated for disinfecting solutions.    Paul Rumps, MD [place]

## 2021-04-17 NOTE — Assessment & Plan Note (Signed)
Smoking cessation counseling was provided.  Approximately 3 minutes were spent discussing the rationale for tobacco cessation and strategies for doing so.  Adjuncts, including nicotine patches and nicotine lozenges were recommended.  Patient was advised he can call the 1 800 quit now line to get patches.  If he would like me to prescribe the patches he will let me know.  Follow-up 3 months.

## 2021-04-17 NOTE — Assessment & Plan Note (Signed)
He has seen his eye doctor and had a evaluation for this.  Reports this was noted to be a chronic issue and nothing wrong was found.

## 2021-04-17 NOTE — Assessment & Plan Note (Signed)
Generally stable.  Trelegy has not seemed to make much of a difference.  He has PFTs scheduled for today.  He will continue Trelegy for now and can use his albuterol as needed.  He will see pulmonology as planned as well.

## 2021-04-17 NOTE — Patient Instructions (Signed)
Nice to see you. Please consider getting nicotine patches to help with smoking cessation.  You would start with the 21 mg patch for 4 weeks followed by the 14 mg patch for 2 weeks followed by the 7 mg patch for 2 weeks.  You can use nicotine gum or lozenges for cravings while you are wearing the patch.

## 2021-04-18 ENCOUNTER — Telehealth: Payer: Self-pay

## 2021-04-18 NOTE — Telephone Encounter (Signed)
-----   Message from [name], MD sent at 04/17/2021  9:20 AM EDT ----- Please fax his recent PSA results to Dr Jeffie Pollock at [facility]. Thanks.

## 2021-04-18 NOTE — Telephone Encounter (Signed)
PSA results were faxed to Dr. Jeffie Pollock at [facility].today.[email or web address],cma

## 2021-05-07 ENCOUNTER — Telehealth: Payer: Self-pay | Admitting: Family Medicine

## 2021-05-07 NOTE — Telephone Encounter (Signed)
PT wife called in to make a apt for the PT. She stated the PT is having dizziness causing him to fall. He almost fell into a wall and they were wanting to be seen. PT wife was not with the PT so did not triage so schedule them for this Friday at 7/15 at 2:00pm with Arnett.

## 2021-05-10 ENCOUNTER — Ambulatory Visit (INDEPENDENT_AMBULATORY_CARE_PROVIDER_SITE_OTHER): Payer: BC Managed Care – PPO | Admitting: Pulmonary Disease

## 2021-05-10 ENCOUNTER — Encounter: Payer: Self-pay | Admitting: Pulmonary Disease

## 2021-05-10 ENCOUNTER — Other Ambulatory Visit: Payer: Self-pay

## 2021-05-10 VITALS — BP 118/66 | HR 67 | Temp 97.5°F | Ht 67.0 in | Wt 157.8 lb

## 2021-05-10 DIAGNOSIS — R911 Solitary pulmonary nodule: Secondary | ICD-10-CM | POA: Diagnosis not present

## 2021-05-10 DIAGNOSIS — J432 Centrilobular emphysema: Secondary | ICD-10-CM

## 2021-05-10 DIAGNOSIS — J449 Chronic obstructive pulmonary disease, unspecified: Secondary | ICD-10-CM

## 2021-05-10 DIAGNOSIS — F17210 Nicotine dependence, cigarettes, uncomplicated: Secondary | ICD-10-CM

## 2021-05-10 MED ORDER — TRELEGY ELLIPTA 200-62.5-25 MCG/INH IN AEPB: 1.0000 | INHALATION_SPRAY | Freq: Every day | RESPIRATORY_TRACT | 11 refills | Status: DC

## 2021-05-10 MED ORDER — ALBUTEROL SULFATE HFA 108 (90 BASE) MCG/ACT IN AERS: 2.0000 | INHALATION_SPRAY | Freq: Four times a day (QID) | RESPIRATORY_TRACT | 0 refills | Status: DC | PRN

## 2021-05-10 NOTE — Progress Notes (Signed)
Subjective:    Patient ID: Paul Fields, male    DOB: 16-Feb-1961, 60 y.o.   MRN: [id number] Chief Complaint  Patient presents with   Follow-up    Sob with exertion, prod cough with clear sputum mainly in the morning and mild wheezing.     HPI Patient is a 60 year old current smoker (1 PPD) who presents for follow-up of COPD and right upper lobe lung nodule.  This is a scheduled visit.  He was last seen 07 February 2021.  For the details of that visit please refer to that note.  He has noted that Trelegy helps better than Breo Ellipta did.  PFTs are as noted below.  He continues to smoke 1 pack of cigarettes per day as noted above.  He states that previously he had quit on Chantix but had relapse.  He had a prescription for generic Chantix ordered on the last visit however because of issues with dizziness he quit the medication.  However, now he tells me that the dizziness has recurred even off of this medication.  He is to see his primary care physician tomorrow to have this symptom evaluated.  He states that he even wakes up in the middle of the night with dizziness.  As noted he is having this evaluated by his primary care physician.   He has not had any change in his cough pattern or sputum production.  No hemoptysis.  No chest pain, paroxysmal nocturnal dyspnea or orthopnea.  No lower extremity edema or calf tenderness.  He does not endorse any fevers, chills or sweats.   Most recently he had a chest CT performed on 23 March, this was reviewed with him and his wife.  It shows a 0.6 cm posteromedial right upper lobe pulmonary nodule, unchanged in size from prior though perhaps slightly more solid.  He will need repeat CT in another 3 months.  DATA 02/03/2018 PFTs: FEV1 2.10 L or 58% predicted, FVC 3.40 L or 75% predicted, FEV1/FVC 62%, significant bronchodilator response both in FEV1 and reduced air trapping.  Lung volumes show significant air trapping at 158% fusion capacity  normal. 05/05/2018 PFTs: FEV1 2.59 L or 71% predicted, FVC 3.98 L or 88% predicted, FEV1/FVC 65%, no significant bronchodilator response, diffusion capacity normal.  Hyperinflation and air trapping noted. 01/17/2021 CT chest: Calcified mediastinal nodes, 6 mm posterior medial right upper lobe groundglass nodule and 0.6 mm nodule in the right upper lobe. 04/17/2021 PFTs: FEV1 2.50 L or 75% predicted, FVC 4.01 L or 91% predicted, FEV1/FVC 62%.  Hyperinflation and air trapping noted.  All airways component with bronchodilator response.  Diffusion capacity mildly reduced.  Review of Systems A 10 point review of systems was performed and it is as noted above otherwise negative.  Patient Active Problem List   Diagnosis Date Noted   Fixed pupil of left eye 01/09/2021   GERD (gastroesophageal reflux disease) 04/24/2018   Centrilobular emphysema ([place]) 01/14/2018   Nodule of right lung 04/04/2016   Personal history of tobacco use, presenting hazards to health 03/14/2016   Hx of adenomatous colonic polyps 08/11/2012   Nicotine dependence, cigarettes, uncomplicated 33/29/5188   Social History   Tobacco Use   Smoking status: Every Day    Packs/day: 1.50    Years: 45.00    Pack years: 67.50    Types: Cigarettes   Smokeless tobacco: Never   Tobacco comments:    1PPD 05/10/2021  Substance Use Topics   Alcohol use: No  Comment: History of 6 pk beer on weekends- Last [facility] 11/2015   No Known Allergies  Current Meds  Medication Sig   albuterol (VENTOLIN HFA) 108 (90 Base) MCG/ACT inhaler Inhale 2 puffs into the lungs every 6 (six) hours as needed for wheezing or shortness of breath.   Fluticasone-Umeclidin-Vilant (TRELEGY ELLIPTA) 100-62.5-25 MCG/INH AEPB Inhale 100 mcg into the lungs daily.   Immunization History  Administered Date(s) Administered   Influenza,inj,Quad PF,6+ Mos 09/10/2013   PFIZER Comirnaty([place])Covid-19 Tri-Sucrose Vaccine 02/06/2020, 02/27/2020   PNEUMOCOCCAL  CONJUGATE-20 01/09/2021   Pneumococcal Polysaccharide-23 05/27/2012   Tdap 05/27/2012   Zoster Recombinat (Shingrix) 01/09/2021, 04/17/2021       Objective:   Physical Exam BP 118/66 (BP Location: Left Arm, Cuff Size: Normal)   Pulse 67   Temp (!) 97.5 F (36.4 C) (Temporal)   Ht 5\' 7"  (1.702 m)   Wt 157 lb 12.8 oz (71.6 kg)   SpO2 99%   BMI 24.71 kg/m   GENERAL: Thin well-developed gentleman in no acute distress, fully ambulatory.  No conversational dyspnea. HEAD: Normocephalic, atraumatic. EYES: Pupils equal, round, reactive to light.  No scleral icterus. MOUTH: Nose/mouth/throat not examined due to masking requirements for COVID 19. NECK: Supple. No thyromegaly. Trachea midline. No JVD.  No adenopathy. PULMONARY: Good air entry bilaterally.  Coarse, occasional rhonchi particularly in the upper lung zones, no other adventitious sounds. CARDIOVASCULAR: S1 and S2. Regular rate and rhythm.  No rubs, murmurs or gallops heard. ABDOMEN: Benign. MUSCULOSKELETAL: No joint deformity, no clubbing, no edema. NEUROLOGIC: No focal deficit, no gait disturbance, speech is fluent. SKIN: Intact,warm,dry.  On limited exam no rashes. PSYCH: Mood and behavior normal     Assessment & Plan:     ICD-10-CM   1. COPD with asthma ([place])  J44.9 albuterol (VENTOLIN HFA) 108 (90 Base) MCG/ACT inhaler   Increasing Trelegy to 200/62.5/25, 1 inhalation daily Prescription to pharmacy and coupon provided for the patient    2. Nodule of right lung  R91.1    Schedule follow-up CT on return visit 6 mm nodule change in size but questionable increased density Too small to characterize by PET    3. Tobacco dependence due to cigarettes  F17.210    Patient counseled regards to discontinuation of smoking He is precontemplative in this regard Was unable to take Chantix     Meds ordered this encounter  Medications   albuterol (VENTOLIN HFA) 108 (90 Base) MCG/ACT inhaler    Sig: Inhale 2 puffs into the  lungs every 6 (six) hours as needed for wheezing or shortness of breath.    Dispense:  8 g    Refill:  0   Fluticasone-Umeclidin-Vilant (TRELEGY ELLIPTA) 200-62.5-25 MCG/INH AEPB    Sig: Inhale 1 puff into the lungs daily.    Dispense:  28 each    Refill:  11    Increasing to 200 mcg strength   We will see the patient in follow-up in 3 months time.  We will schedule him for CT scan of the chest at that time.  He is to contact us prior to that time should any new difficulties arise.  Renold Don, MD [facility]   *This note was dictated using voice recognition software/Dragon.  Despite best efforts to proofread, errors can occur which can change the meaning.  Any change was purely unintentional.

## 2021-05-10 NOTE — Patient Instructions (Signed)
We are increasing your Trelegy to the 200 mcg size this will be still 1 puff daily.  We refilled your albuterol inhaler.  We will see you in follow-up in 3 months time call sooner should any new difficulties arise.

## 2021-05-11 ENCOUNTER — Ambulatory Visit: Payer: BC Managed Care – PPO | Admitting: Family

## 2021-05-11 ENCOUNTER — Encounter: Payer: Self-pay | Admitting: Pulmonary Disease

## 2021-05-11 VITALS — BP 116/70 | HR 70 | Ht 67.0 in | Wt 158.2 lb

## 2021-05-11 DIAGNOSIS — R42 Dizziness and giddiness: Secondary | ICD-10-CM | POA: Diagnosis not present

## 2021-05-11 MED ORDER — MECLIZINE HCL 12.5 MG PO TABS: 12.5000 mg | ORAL_TABLET | Freq: Three times a day (TID) | ORAL | 1 refills | Status: DC | PRN

## 2021-05-11 NOTE — Assessment & Plan Note (Addendum)
Exam supports diagnosis of BPPV. Discussed at length with patient the causes of dizziness as well as my working diagnosis of BPPV.  Pending MRI brain, ENT consult, and start meclizine  Advised patient to remain vigilant about triggers for dizziness as related to water intake, food and stress. He is also orthostatic ( see flow sheet) and encouraged less caffeine,  More water. Close follow up.

## 2021-05-11 NOTE — Patient Instructions (Addendum)
Referral to ENT  Order for MRI brain  Let us know if you dont hear back within a week in regards to an appointment being scheduled.   Trial of antivert   At this point, I suspect Benign paroxysmal positional vertigo (BPPV) and have included information from [facility] below.   You may look videos for Epley's maneuvers as we discussed online however I would wait until you see ENT prior to practicing any of these maneuvers on your own.     If there is no improvement in your symptoms, or if there is any worsening of symptoms, or if you have any additional concerns, please return to this clinic for re-evaluation; or, if we are closed, consider going to the Emergency Room for evaluation.   What is BPPV?  BPPV  is one of the most common causes of vertigo -- the sudden sensation that you're spinning or that the inside of your head is spinning. Benign paroxysmal positional vertigo causes brief episodes of mild to intense dizziness. Benign paroxysmal positional vertigo is usually triggered by specific changes in the position of your head. This might occur when you tip your head up or down, when you lie down, or when you turn over or sit up in bed. Although benign paroxysmal positional vertigo can be a bothersome problem, it's rarely serious except when it increases the chance of falls.  If you experience dizziness associated with benign paroxysmal positional vertigo (BPPV), consider these tips: Be aware of the possibility of losing your balance, which can lead to falling and serious injury.  Sit down immediately when you feel dizzy.  Use good lighting if you get up at night.  Walk with a cane for stability if you're at risk of falling.  Work closely with your doctor to manage your symptoms effectively. BPPV may recur even after successful therapy. Fortunately, although there's no cure, the condition can be managed with physical therapy and home treatments.   Dizziness [Uncertain  Cause] Dizziness is a common symptom sometimes described as "lightheadedness" or feeling like you are going to faint. If it lasts for only a few seconds and is related to changes in position (such as getting up after lying or sitting for a long time), it is usually not a sign of anything serious. Dizziness that lasts for minutes to hours, or comes on for no apparent reason, may be a sign of a more serious problem (such as dehydration, a medicine reaction, disease of the heart or brain). Today's exam did not show an exact cause for your dizzy spell . Sometimes additional tests are required before a cause can be found. Therefore, it is important to follow up with your doctor if your symptoms continue. Home Care: 1) If a dizzy spell occurs and lasts more than a few seconds, lie down until it passes. If you are lying down, then you cannot hurt yourself by falling if you do faint. 2) Do not drive or operate dangerous equipment until the dizzy spells have stopped for at least 48 hours. 3) If dizzy spells occur with sudden standing, this may be a sign of mild dehydration. Drink extra fluids over the next few days. 4) If you recently started a new medicine or if you had the dose of a current medicine increased (especially blood pressure medicine), talk with the prescribing doctor about your symptoms. Dose adjustments may be needed. Follow Up with your doctor for further evaluation within the next seven days, if your symptoms continue. Get Prompt  Medical Attention if any of the following occur: -- Worsening of your symptoms -- Fainting, headache or seizure -- Repeated vomiting -- Feeling like you or the room is spinning -- Chest, arm, neck, back or jaw pain -- Palpitations (the sense that your heart is fluttering or beating fast or hard) -- Shortness of breath -- Blood in vomit or stool (black or red color) -- Weakness of an arm or leg or one side of the face -- Difficulty with speech or vision   2000-2015 [employer] [address], [place] [address]. All rights reserved. This information is not intended as a substitute for professional medical care. Always follow your healthcare professional's instructions.

## 2021-05-11 NOTE — Progress Notes (Signed)
Subjective:    Patient ID: Paul Fields, male    DOB: September 26, 1961, 60 y.o.   MRN: [id number]  CC: Paul Fields is a 60 y.o. male who presents today for an acute visit.    HPI: Accompanied by his wife  Complains of 'room spinning' for a couple of months, worsening Exacerbated when lying down, turns head side to side.  Will sit on edge of the bed for a few minutes and wait for sensation to pass, last 20 - 30 seconds. Occurring multiple times per day.   No vision loss, syncope,hearing loss, congestion, ear pain, tinnitus, pulsatile tinnitus, chest pain or pressure, numbness or tingling radiating to left arm or jaw, palpitations, frequent headaches, changes in vision.   Drinks one pot of coffee per day. Very little water.   Had been on chantix and stopped chantix 6-8 weeks as thought causing dizziness.   Drives truck , Health and safety inspector. He has never had episode when driving.    Copd- Breathing is at baseline. compliant with trelegy, prn albuterol, follows with Dr Patsey Berthold. Feels regimen is effective.    HISTORY:  Past Medical History:  Diagnosis Date   Arthritis    Clavicle fracture 1986   COPD (chronic obstructive pulmonary disease) ([place])    Diverticulosis    Elbow fracture, right 1968   Emphysema lung ([facility])    Hemorrhoid    Major depressive disorder, recurrent episode ([place]) 03/07/2016   when mom passed    Polyp of colon, adenomatous    possible based on genetic testing   Past Surgical History:  Procedure Laterality Date   COLONOSCOPY     COLONOSCOPY W/ BIOPSIES  04/2015   polyps-benign   ELBOW FRACTURE SURGERY Right 1967   Pins, then hardware removal   ELBOW HARDWARE REMOVAL Right 1969   right   POLYPECTOMY     polyps  2013   20 polyps biopsies as tubular adenomas   right toe surgery  2018   WISDOM TOOTH EXTRACTION  1983   Family History  Problem Relation Age of Onset   COPD Father    Arthritis Father    Parkinsonism Mother    Diabetes Mother         borderline   Lung cancer Mother    Cancer Mother        Lung   Colon cancer Maternal Uncle        diagnosed in his 86s   Alzheimer's disease Paternal Aunt    COPD Paternal Uncle    Lung cancer Maternal Grandfather        coal miner   Prostate cancer Maternal Uncle        diagnosed in his 34s   Alzheimer's disease Paternal Aunt    Heart attack Paternal Uncle    Brain cancer Paternal Uncle 71   Lymphoma Cousin        paternal cousin   COPD Brother        prostate   Cancer Paternal Grandfather        lung   Heart disease Neg Hx    Stomach cancer Neg Hx    Rectal cancer Neg Hx    Colon polyps Neg Hx    Esophageal cancer Neg Hx     Allergies: Patient has no known allergies. Current Outpatient Medications on File Prior to Visit  Medication Sig Dispense Refill   albuterol (VENTOLIN HFA) 108 (90 Base) MCG/ACT inhaler Inhale 2 puffs into the lungs every 6 (six) hours  as needed for wheezing or shortness of breath. 8 g 0   Fluticasone-Umeclidin-Vilant (TRELEGY ELLIPTA) 200-62.5-25 MCG/INH AEPB Inhale 1 puff into the lungs daily. 28 each 11   No current facility-administered medications on file prior to visit.    Social History   Tobacco Use   Smoking status: Every Day    Packs/day: 1.50    Years: 45.00    Pack years: 67.50    Types: Cigarettes   Smokeless tobacco: Never   Tobacco comments:    1PPD 05/10/2021  Vaping Use   Vaping Use: Never used  Substance Use Topics   Alcohol use: No    Comment: History of 6 pk beer on weekends- Last [facility] 11/2015   Drug use: No    Review of Systems  Constitutional:  Negative for chills, fever and unexpected weight change.  HENT:  Negative for congestion, ear discharge, ear pain and tinnitus.   Respiratory:  Positive for shortness of breath (at baseline). Negative for cough.   Cardiovascular:  Negative for chest pain and palpitations.  Gastrointestinal:  Negative for nausea and vomiting.  Musculoskeletal:  Negative for neck pain.   Neurological:  Positive for dizziness. Negative for syncope, numbness and headaches.     Objective:    BP 116/70 (BP Location: Left Arm, Patient Position: Sitting, Cuff Size: Large)   Pulse 70   Ht 5\' 7"  (1.702 m)   Wt 158 lb 3.2 oz (71.8 kg)   SpO2 98%   BMI 24.78 kg/m    Physical Exam Vitals reviewed.  Constitutional:      Appearance: He is well-developed.  HENT:     Head: Normocephalic and atraumatic.     Right Ear: Hearing, tympanic membrane, ear canal and external ear normal. No decreased hearing noted. No drainage, swelling or tenderness. No middle ear effusion. Tympanic membrane is not injected, erythematous or bulging.     Left Ear: Hearing, tympanic membrane, ear canal and external ear normal. No decreased hearing noted. No drainage, swelling or tenderness.  No middle ear effusion. Tympanic membrane is not injected, erythematous or bulging.     Nose: Nose normal.     Right Sinus: No maxillary sinus tenderness or frontal sinus tenderness.     Left Sinus: No maxillary sinus tenderness or frontal sinus tenderness.     Mouth/Throat:     Pharynx: Uvula midline. No oropharyngeal exudate or posterior oropharyngeal erythema.     Tonsils: No tonsillar abscesses.  Eyes:     General: Lids are normal. Lids are everted, no foreign bodies appreciated.     Conjunctiva/sclera: Conjunctivae normal.     Pupils: Pupils are equal, round, and reactive to light.     Comments: Normal fundus bilaterally  Cardiovascular:     Rate and Rhythm: Regular rhythm.     Heart sounds: Normal heart sounds.  Pulmonary:     Effort: Pulmonary effort is normal. No respiratory distress.     Breath sounds: Normal breath sounds. No wheezing, rhonchi or rales.  Lymphadenopathy:     Head:     Right side of head: No submental, submandibular, tonsillar, preauricular, posterior auricular or occipital adenopathy.     Left side of head: No submental, submandibular, tonsillar, preauricular, posterior auricular or  occipital adenopathy.     Cervical: No cervical adenopathy.  Skin:    General: Skin is warm and dry.  Neurological:     Mental Status: He is alert.     Cranial Nerves: No cranial nerve deficit.  Sensory: No sensory deficit.     Deep Tendon Reflexes:     Reflex Scores:      Bicep reflexes are 2+ on the right side and 2+ on the left side.      Patellar reflexes are 2+ on the right side and 2+ on the left side.    Comments: Grip equal and strong bilateral upper extremities. Gait strong and steady as he walked across exam room. Able to perform  finger-to-nose without difficulty.  Dix hall pike maneuver elicited vertigo. No nystagmus noted.     Psychiatric:        Speech: Speech normal.        Behavior: Behavior normal.       Assessment & Plan:   Problem List Items Addressed This Visit       Other   Vertigo - Primary    Exam supports diagnosis of BPPV. Discussed at length with patient the causes of dizziness as well as my working diagnosis of BPPV.  Pending MRI brain, ENT consult, and start meclizine  Advised patient to remain vigilant about triggers for dizziness as related to water intake, food and stress. He is also orthostatic ( see flow sheet) and encouraged less caffeine,  More water. Close follow up.        Relevant Medications   meclizine (ANTIVERT) 12.5 MG tablet   Other Relevant Orders   MR BRAIN W WO CONTRAST   Ambulatory referral to ENT   CBC with Differential/Platelet   Comprehensive metabolic panel   Magnesium      I am having Paul Fields start on meclizine. I am also having him maintain his albuterol and Trelegy Ellipta.   Meds ordered this encounter  Medications   meclizine (ANTIVERT) 12.5 MG tablet    Sig: Take 1 tablet (12.5 mg total) by mouth 3 (three) times daily as needed for dizziness.    Dispense:  30 tablet    Refill:  1    Order Specific Question:   Supervising Provider    Answer:   Crecencio Mc [[id number]]    Return precautions  given.   Risks, benefits, and alternatives of the medications and treatment plan prescribed today were discussed, and patient expressed understanding.   Education regarding symptom management and diagnosis given to patient on AVS.  Continue to follow with [name], MD for routine health maintenance.   Paul Fields and I agreed with plan.   Mable Paris, FNP  I have spent 27 minutes with a patient including precharting, exam, reviewing medical records, and discussion plan of care.

## 2021-05-12 LAB — COMPREHENSIVE METABOLIC PANEL
AG Ratio: 2.4 (calc) (ref 1.0–2.5)
ALT: 14 U/L (ref 9–46)
AST: 14 U/L (ref 10–35)
Albumin: 4.7 g/dL (ref 3.6–5.1)
Alkaline phosphatase (APISO): 47 U/L (ref 35–144)
BUN: 19 mg/dL (ref 7–25)
CO2: 29 mmol/L (ref 20–32)
Calcium: 9.4 mg/dL (ref 8.6–10.3)
Chloride: 103 mmol/L (ref 98–110)
Creat: 1.04 mg/dL (ref 0.70–1.35)
Globulin: 2 g/dL (calc) (ref 1.9–3.7)
Glucose, Bld: 73 mg/dL (ref 65–99)
Potassium: 4.4 mmol/L (ref 3.5–5.3)
Sodium: 138 mmol/L (ref 135–146)
Total Bilirubin: 0.5 mg/dL (ref 0.2–1.2)
Total Protein: 6.7 g/dL (ref 6.1–8.1)

## 2021-05-12 LAB — CBC WITH DIFFERENTIAL/PLATELET
Absolute Monocytes: 497 cells/uL (ref 200–950)
Basophils Absolute: 62 cells/uL (ref 0–200)
Basophils Relative: 0.9 %
Eosinophils Absolute: 159 cells/uL (ref 15–500)
Eosinophils Relative: 2.3 %
HCT: 50.3 % — ABNORMAL HIGH (ref 38.5–50.0)
Hemoglobin: 16.8 g/dL (ref 13.2–17.1)
Lymphs Abs: 2360 cells/uL (ref 850–3900)
MCH: 32.4 pg (ref 27.0–33.0)
MCHC: 33.4 g/dL (ref 32.0–36.0)
MCV: 96.9 fL (ref 80.0–100.0)
MPV: 11 fL (ref 7.5–12.5)
Monocytes Relative: 7.2 %
Neutro Abs: 3823 cells/uL (ref 1500–7800)
Neutrophils Relative %: 55.4 %
Platelets: 256 10*3/uL (ref 140–400)
RBC: 5.19 10*6/uL (ref 4.20–5.80)
RDW: 12.1 % (ref 11.0–15.0)
Total Lymphocyte: 34.2 %
WBC: 6.9 10*3/uL (ref 3.8–10.8)

## 2021-05-12 LAB — MAGNESIUM: Magnesium: 2.1 mg/dL (ref 1.5–2.5)

## 2021-05-14 ENCOUNTER — Telehealth: Payer: Self-pay | Admitting: Family Medicine

## 2021-05-14 NOTE — Telephone Encounter (Signed)
Pt returned your call.  

## 2021-05-14 NOTE — Telephone Encounter (Signed)
Lft vm for pt to call ofc to sch MRI.thanks

## 2021-05-22 ENCOUNTER — Ambulatory Visit
Admission: RE | Admit: 2021-05-22 | Discharge: 2021-05-22 | Disposition: A | Discharge status: Home / Self Care | Payer: BC Managed Care – PPO | Source: Ambulatory Visit | Attending: Family | Admitting: Family

## 2021-05-22 ENCOUNTER — Other Ambulatory Visit: Payer: Self-pay

## 2021-05-22 DIAGNOSIS — R42 Dizziness and giddiness: Secondary | ICD-10-CM | POA: Diagnosis present

## 2021-05-22 MED ORDER — GADOBUTROL 1 MMOL/ML IV SOLN
7.0000 mL | Freq: Once | INTRAVENOUS | Status: AC | PRN
  Administered 2021-05-22: 7 mL via INTRAVENOUS

## 2021-05-23 ENCOUNTER — Telehealth: Payer: Self-pay

## 2021-05-23 ENCOUNTER — Encounter: Payer: Self-pay | Admitting: Family

## 2021-05-23 NOTE — Telephone Encounter (Signed)
LMTCB for MRI results.

## 2021-07-24 ENCOUNTER — Ambulatory Visit: Payer: BC Managed Care – PPO | Admitting: Family Medicine

## 2021-08-31 ENCOUNTER — Ambulatory Visit
Admission: RE | Admit: 2021-08-31 | Discharge: 2021-08-31 | Disposition: A | Discharge status: Home / Self Care | Payer: BC Managed Care – PPO | Source: Ambulatory Visit | Attending: Pulmonary Disease | Admitting: Pulmonary Disease

## 2021-08-31 ENCOUNTER — Other Ambulatory Visit: Payer: Self-pay

## 2021-08-31 DIAGNOSIS — R911 Solitary pulmonary nodule: Secondary | ICD-10-CM | POA: Insufficient documentation

## 2021-09-04 ENCOUNTER — Telehealth: Payer: Self-pay | Admitting: Pulmonary Disease

## 2021-09-04 DIAGNOSIS — R911 Solitary pulmonary nodule: Secondary | ICD-10-CM

## 2021-09-04 NOTE — Telephone Encounter (Signed)
CT shows that most of the spots that he has had are stable in the lung.  However he does have a new area on the left upper portion that needs to be followed.  This is still very very small recommend a chest CT in another 3 months with no contrast.  Overall, though, everything else is stable.   Patient is aware of results and voiced his understanding.  CT ordered. Nothing further needed at this time.

## 2022-01-04 ENCOUNTER — Ambulatory Visit
Admission: RE | Admit: 2022-01-04 | Discharge: 2022-01-04 | Disposition: A | Discharge status: Home / Self Care | Payer: BC Managed Care – PPO | Source: Ambulatory Visit | Attending: Pulmonary Disease | Admitting: Pulmonary Disease

## 2022-01-04 ENCOUNTER — Other Ambulatory Visit: Payer: Self-pay

## 2022-01-04 DIAGNOSIS — R911 Solitary pulmonary nodule: Secondary | ICD-10-CM | POA: Diagnosis present

## 2022-01-07 ENCOUNTER — Other Ambulatory Visit: Payer: Self-pay

## 2022-01-07 DIAGNOSIS — R911 Solitary pulmonary nodule: Secondary | ICD-10-CM

## 2022-01-22 ENCOUNTER — Other Ambulatory Visit: Payer: Self-pay

## 2022-01-22 ENCOUNTER — Ambulatory Visit (HOSPITAL_COMMUNITY)
Admission: RE | Admit: 2022-01-22 | Discharge: 2022-01-22 | Disposition: A | Discharge status: Home / Self Care | Payer: BC Managed Care – PPO | Source: Ambulatory Visit | Attending: Pulmonary Disease | Admitting: Pulmonary Disease

## 2022-01-22 DIAGNOSIS — R911 Solitary pulmonary nodule: Secondary | ICD-10-CM | POA: Insufficient documentation

## 2022-01-22 LAB — GLUCOSE, CAPILLARY: Glucose-Capillary: 102 mg/dL — ABNORMAL HIGH (ref 70–99)

## 2022-01-22 MED ORDER — FLUDEOXYGLUCOSE F - 18 (FDG) INJECTION
7.9000 | Freq: Once | INTRAVENOUS | Status: AC | PRN
  Administered 2022-01-22: 7.89 via INTRAVENOUS

## 2022-01-24 ENCOUNTER — Telehealth: Payer: Self-pay | Admitting: Pulmonary Disease

## 2022-01-24 NOTE — Telephone Encounter (Signed)
Paul Pita, MD  Linwood Dibbles, CMA ?I would like for him to make an appointment so we can see him and consider a biopsy of the spot that he has in his lung that we have been following.  The PET/CT does show some activity on this nodule.  It would be prudent to proceed with a biopsy.  Lets get him in within the next few weeks.  ? ? ?Patient is aware of results and voiced his understanding.  ?Appt scheduled 01/31/2022 at 4:30. ?Nothing further needed.  ? ?

## 2022-01-24 NOTE — Telephone Encounter (Signed)
Spoke to patient's spouse, Lisa(DPR). ?She is aware that appt is needed prior to bx. She voiced her understanding.  ?Nothing further needed.  ? ?

## 2022-01-30 NOTE — Telephone Encounter (Signed)
error 

## 2022-01-31 ENCOUNTER — Encounter: Payer: Self-pay | Admitting: Pulmonary Disease

## 2022-01-31 ENCOUNTER — Ambulatory Visit: Payer: BC Managed Care – PPO | Admitting: Pulmonary Disease

## 2022-01-31 ENCOUNTER — Telehealth: Payer: Self-pay | Admitting: Pulmonary Disease

## 2022-01-31 VITALS — BP 120/64 | HR 76 | Temp 97.8°F | Ht 67.0 in | Wt 154.8 lb

## 2022-01-31 DIAGNOSIS — F17210 Nicotine dependence, cigarettes, uncomplicated: Secondary | ICD-10-CM | POA: Diagnosis not present

## 2022-01-31 DIAGNOSIS — J449 Chronic obstructive pulmonary disease, unspecified: Secondary | ICD-10-CM

## 2022-01-31 DIAGNOSIS — R911 Solitary pulmonary nodule: Secondary | ICD-10-CM

## 2022-01-31 MED ORDER — BREZTRI AEROSPHERE 160-9-4.8 MCG/ACT IN AERO: 2.0000 | INHALATION_SPRAY | Freq: Two times a day (BID) | RESPIRATORY_TRACT | 0 refills | Status: DC

## 2022-01-31 NOTE — Patient Instructions (Signed)
I recommend you quit smoking. ? ?We are giving you a trial of an inhaler called Breztri this is 2 puffs twice a day. ? ?We are scheduling you for a CT scan of the chest that will help [place] map out your lung to get to the spot in your lung.  Please use your emergency inhaler prior to the scan (you can use it while you are in the waiting room) this helps open up your airways so that we can get a better scan. ? ?We have schedule you for the procedure on 20 April at 12:30 PM.  You will receive further instructions for preoperative evaluation. ? ?We will see you in follow-up in 6 weeks time.  This is a follow-up so that we can "stay in touch" we will be however, communicating with you after the procedure via phone and conveying results and plans for further follow-up. ? ? ?

## 2022-01-31 NOTE — H&P (View-Only) (Signed)
? ?Subjective:  ? ? Patient ID: Paul Fields, male    DOB: 1960/11/28, 61 y.o.   MRN: [id number] ?Patient Care Team: ?[name], MD as PCP - General (Family Medicine) ? ?Chief Complaint  ?Patient presents with  ? Follow-up  ?  Review PET-  ? ?HPI ?Patient is a 42 year old current smoker (1 PPD, 67.5 PY) who presents for follow-up of COPD and LEFT upper lobe lung nodule.  This is a scheduled visit.  He was last seen 05/10/2021 and had been lost to follow-up.  For the details of that visit please refer to that note.  Patient has had bilateral lung nodules that have been followed expectantly.  Initially he had been following with Dr. [name] for this issue.  He had a chest CT scheduled in November 2022 which showed a new LEFT upper lobe nodule that was 7 mm and recommendation for 3 to 32-monthfollow-up was made.  We contacted the patient and he was scheduled for CT.  He missed follow-up appointments.  His follow-up CT was not done till 10 March.  This showed that the LEFT upper lobe nodule had now grown to 1.2 cm x 0.5 cm.  This was followed with a PET/CT on 28 March which shows that this is hypermetabolic and consistent with malignancy.  He presents today to discuss diagnostic alternatives.  With regards to this nodule he is asymptomatic. He has not had any change in his cough pattern or sputum production.  No hemoptysis.  No chest pain, paroxysmal nocturnal dyspnea or orthopnea.  No lower extremity edema or calf tenderness.  He does not endorse any fevers, chills or sweats.  Mild dyspnea on exertion.  He has had weight loss however this has been an ongoing issue for him.  He is not experiencing any anorexia. ? ?He continues to smoke 1 pack of cigarettes per day as noted above.    ? ?He presents to the visit with his sister who is a retired RRT, his daughter and his wife.  We discussed labs for work-up of the lesion.  Patient is anxious to pursue diagnosis.  Recommendation was made for robotic assisted  bronchoscopy.  The patient and his family had opportunity to ask questions and these were answered to their satisfaction.  He was shown a video of the robotic bronchoscopy procedure. ? ?He continues to work at a [employer]  He remains quite active during the day.  He discontinued use of Trelegy due to inability to get the medication reliably at his local pharmacy.  He did not notify [place] this.  He did note improvement on his shortness of breath and stamina with the medication ? ?DATA ?02/03/2018 PFTs: FEV1 2.10 L or 58% predicted, FVC 3.40 L or 75% predicted, FEV1/FVC 62%, significant bronchodilator response both in FEV1 and reduced air trapping.  Lung volumes show significant air trapping at 158% fusion capacity normal. ?05/05/2018 PFTs: FEV1 2.59 L or 71% predicted, FVC 3.98 L or 88% predicted, FEV1/FVC 65%, no significant bronchodilator response, diffusion capacity normal.  Hyperinflation and air trapping noted. ?01/17/2021 CT chest: Calcified mediastinal nodes, 6 mm posterior medial right upper lobe groundglass nodule and 0.6 mm nodule in the right upper lobe. ?04/17/2021 PFTs: FEV1 2.50 L or 75% predicted, FVC 4.01 L or 91% predicted, FEV1/FVC 62%. Hyperinflation and air trapping noted.  All airways component with bronchodilator response.  Diffusion capacity mildly reduced. ?08/31/2021 CT chest: Interval development of a 7 mm irregular left upper lobe  pulmonary nodule noncontrasted CT 3 to 6 months is recommended other pulmonary nodules stable.  Patient scheduled for a 3 month follow-up. ?01/04/2022 CT chest: Interval enlargement of the previously noted left upper lobe pulmonary nodule measuring 1.2 x 0.5 cm. ?01/22/2022 PET/CT: Hypermetabolic nodule in the left upper lobe concerning for primary bronchogenic carcinoma.  No evidence of mediastinal nodal metastasis.  No distant metastatic disease.  Scattered pulmonary nodules and calcified small mediastinal lymph nodes consistent with prior  granulomatous disease. ? ?Review of Systems ?A 10 point review of systems was performed and it is as noted above otherwise negative. ? ?Patient Active Problem List  ? Diagnosis Date Noted  ? Vertigo 05/11/2021  ? Fixed pupil of left eye 01/09/2021  ? GERD (gastroesophageal reflux disease) 04/24/2018  ? Centrilobular emphysema ([place]) 01/14/2018  ? Nodule of right lung 04/04/2016  ? Personal history of tobacco use, presenting hazards to health 03/14/2016  ? Hx of adenomatous colonic polyps 08/11/2012  ? Nicotine dependence, cigarettes, uncomplicated 22/29/7989  ? ?Social History  ? ?Tobacco Use  ? Smoking status: Every Day  ?  Packs/day: 3.00  ?  Years: 45.00  ?  Pack years: 135.00  ?  Types: Cigarettes  ? Smokeless tobacco: Never  ? Tobacco comments:  ?  1PPD 01/31/2022  ?Substance Use Topics  ? Alcohol use: No  ?  Comment: History of 6 pk beer on weekends- Last [facility] 11/2015  ? ?No Known Allergies ? ?Current Meds  ?Medication Sig  ? albuterol (VENTOLIN HFA) 108 (90 Base) MCG/ACT inhaler Inhale 2 puffs into the lungs every 6 (six) hours as needed for wheezing or shortness of breath.  ? ?Immunization History  ?Administered Date(s) Administered  ? Influenza,inj,Quad PF,6+ Mos 09/10/2013  ? PFIZER Comirnaty(Gray Top)Covid-19 Tri-Sucrose Vaccine 02/06/2020, 02/27/2020  ? PNEUMOCOCCAL CONJUGATE-20 01/09/2021  ? Pneumococcal Polysaccharide-23 05/27/2012  ? Tdap 05/27/2012  ? Zoster Recombinat (Shingrix) 01/09/2021, 04/17/2021  ? ?   ?Objective:  ? Physical Exam ?BP 120/64 (BP Location: Left Arm, Cuff Size: Normal)   Pulse 76   Temp 97.8 ?F (36.6 ?C) (Temporal)   Ht '5\' 7"'$  (1.702 m)   Wt 154 lb 12.8 oz (70.2 kg)   SpO2 98%   BMI 24.25 kg/m?  ?GENERAL: Thin well-developed gentleman in no acute distress, fully ambulatory.  No conversational dyspnea. ?HEAD: Normocephalic, atraumatic. ?EYES: Pupils equal, round, reactive to light.  No scleral icterus. ?MOUTH: Poor dentition, oral mucosa moist. ?NECK: Supple. No thyromegaly.  Trachea midline. No JVD.  No adenopathy. ?PULMONARY: Good air entry bilaterally.  Coarse, no other adventitious sounds.  ?CARDIOVASCULAR: S1 and S2. Regular rate and rhythm.  No rubs, murmurs or gallops heard. ?ABDOMEN: Benign. ?MUSCULOSKELETAL: No joint deformity, no clubbing, no edema. ?NEUROLOGIC: No focal deficit, no gait disturbance, speech is fluent. ?SKIN: Intact,warm,dry.  On limited exam, no rashes. ?PSYCH: Mood and behavior normal ? ?Representative image from the CT performed 04 January 2022 showing the nodule in question: ? ? ? ? ?   ?Assessment & Plan:  ? ?  ICD-10-CM   ?1. Lung nodule - LEFT UL 1.2 cm X 0.5 cm  R91.1 CT SUPER D CHEST WO MONARCH PILOT  ?  Pulmonary Function Test [facility] Only  ? Hypermetabolic, malignancy suspected ?Robotic assisted bronchoscopy for diagnosis ?Procedure scheduled for 15 February 2019 12:30 PM  ?  ?2. COPD, moderate ([place])  J44.9   ? Breztri 2 puffs twice a day ?Continue albuterol as needed ?Quit smoking  ?  ?3. Tobacco dependence due to  cigarettes  F17.210   ? Patient counseled regards to discontinuation of smoking ?Oral counseling time 3 to 5 minutes  ?  ? ?Orders Placed This Encounter  ?Procedures  ? CT SUPER D CHEST WO MONARCH PILOT  ?  Standing Status:   Future  ?  Standing Expiration Date:   02/01/2023  ?  Scheduling Instructions:  ?   Prior to 02/13/22  ?  Order Specific Question:   Preferred imaging location?  ?  Answer:   [facility]  ? Pulmonary Function Test [facility] Only  ?  Within one month.  ?  Standing Status:   Future  ?  Standing Expiration Date:   02/01/2023  ?  Order Specific Question:   Full PFT: includes the following: basic spirometry, spirometry pre & post bronchodilator, diffusion capacity (DLCO), lung volumes  ?  Answer:   Full PFT  ? ?Patient was provided with 2 samples of Breztri.  He was instructed on the proper use of the inhaler which is 2 puffs twice a day. ? ?Benefits, limitations and potential complications of the procedure were discussed with the  patient/family.  Complications from bronchoscopy are rare and most often minor, but if they occur they may include breathing difficulty, vocal cord spasm, hoarseness, slight fever, vomiting, dizziness, bronchospasm, infect

## 2022-01-31 NOTE — Progress Notes (Signed)
? ?Subjective:  ? ? Patient ID: Paul Fields, male    DOB: [birth year], 61 y.o.   MRN: [id number] ?Patient Care Team: ?[name], MD as PCP - General (Family Medicine) ? ?Chief Complaint  ?Patient presents with  ? Follow-up  ?  Review PET-  ? ?HPI ?Patient is a 57 year old current smoker (1 PPD, 67.5 PY) who presents for follow-up of COPD and LEFT upper lobe lung nodule.  This is a scheduled visit.  He was last seen 05/10/2021 and had been lost to follow-up.  For the details of that visit please refer to that note.  Patient has had bilateral lung nodules that have been followed expectantly.  Initially he had been following with Dr. [name] for this issue.  He had a chest CT scheduled in November 2022 which showed a new LEFT upper lobe nodule that was 7 mm and recommendation for 3 to 6-monthfollow-up was made.  We contacted the patient and he was scheduled for CT.  He missed follow-up appointments.  His follow-up CT was not done till 10 March.  This showed that the LEFT upper lobe nodule had now grown to 1.2 cm x 0.5 cm.  This was followed with a PET/CT on 28 March which shows that this is hypermetabolic and consistent with malignancy.  He presents today to discuss diagnostic alternatives.  With regards to this nodule he is asymptomatic. He has not had any change in his cough pattern or sputum production.  No hemoptysis.  No chest pain, paroxysmal nocturnal dyspnea or orthopnea.  No lower extremity edema or calf tenderness.  He does not endorse any fevers, chills or sweats.  Mild dyspnea on exertion.  He has had weight loss however this has been an ongoing issue for him.  He is not experiencing any anorexia. ? ?He continues to smoke 1 pack of cigarettes per day as noted above.    ? ?He presents to the visit with his sister who is a retired RRT, his daughter and his wife.  We discussed labs for work-up of the lesion.  Patient is anxious to pursue diagnosis.  Recommendation was made for robotic assisted  bronchoscopy.  The patient and his family had opportunity to ask questions and these were answered to their satisfaction.  He was shown a video of the robotic bronchoscopy procedure. ? ?He continues to work at a [employer]  He remains quite active during the day.  He discontinued use of Trelegy due to inability to get the medication reliably at his local pharmacy.  He did not notify [place].  He did note improvement on his shortness of breath and stamina with the medication ? ?DATA ?02/03/2018 PFTs: FEV1 2.10 L or 58% predicted, FVC 3.40 L or 75% predicted, FEV1/FVC 62%, significant bronchodilator response both in FEV1 and reduced air trapping.  Lung volumes show significant air trapping at 158% fusion capacity normal. ?05/05/2018 PFTs: FEV1 2.59 L or 71% predicted, FVC 3.98 L or 88% predicted, FEV1/FVC 65%, no significant bronchodilator response, diffusion capacity normal.  Hyperinflation and air trapping noted. ?01/17/2021 CT chest: Calcified mediastinal nodes, 6 mm posterior medial right upper lobe groundglass nodule and 0.6 mm nodule in the right upper lobe. ?04/17/2021 PFTs: FEV1 2.50 L or 75% predicted, FVC 4.01 L or 91% predicted, FEV1/FVC 62%. Hyperinflation and air trapping noted.  All airways component with bronchodilator response.  Diffusion capacity mildly reduced. ?08/31/2021 CT chest: Interval development of a 7 mm irregular left upper lobe  pulmonary nodule noncontrasted CT 3 to 6 months is recommended other pulmonary nodules stable.  Patient scheduled for a 3 month follow-up. ?01/04/2022 CT chest: Interval enlargement of the previously noted left upper lobe pulmonary nodule measuring 1.2 x 0.5 cm. ?01/22/2022 PET/CT: Hypermetabolic nodule in the left upper lobe concerning for primary bronchogenic carcinoma.  No evidence of mediastinal nodal metastasis.  No distant metastatic disease.  Scattered pulmonary nodules and calcified small mediastinal lymph nodes consistent with prior  granulomatous disease. ? ?Review of Systems ?A 10 point review of systems was performed and it is as noted above otherwise negative. ? ?Patient Active Problem List  ? Diagnosis Date Noted  ? Vertigo 05/11/2021  ? Fixed pupil of left eye 01/09/2021  ? GERD (gastroesophageal reflux disease) 04/24/2018  ? Centrilobular emphysema ([place]) 01/14/2018  ? Nodule of right lung 04/04/2016  ? Personal history of tobacco use, presenting hazards to health 03/14/2016  ? Hx of adenomatous colonic polyps 08/11/2012  ? Nicotine dependence, cigarettes, uncomplicated 02/63/7858  ? ?Social History  ? ?Tobacco Use  ? Smoking status: Every Day  ?  Packs/day: 3.00  ?  Years: 45.00  ?  Pack years: 135.00  ?  Types: Cigarettes  ? Smokeless tobacco: Never  ? Tobacco comments:  ?  1PPD 01/31/2022  ?Substance Use Topics  ? Alcohol use: No  ?  Comment: History of 6 pk beer on weekends- Last [facility] 11/2015  ? ?No Known Allergies ? ?Current Meds  ?Medication Sig  ? albuterol (VENTOLIN HFA) 108 (90 Base) MCG/ACT inhaler Inhale 2 puffs into the lungs every 6 (six) hours as needed for wheezing or shortness of breath.  ? ?Immunization History  ?Administered Date(s) Administered  ? Influenza,inj,Quad PF,6+ Mos 09/10/2013  ? PFIZER Comirnaty(Gray Top)Covid-19 Tri-Sucrose Vaccine 02/06/2020, 02/27/2020  ? PNEUMOCOCCAL CONJUGATE-20 01/09/2021  ? Pneumococcal Polysaccharide-23 05/27/2012  ? Tdap 05/27/2012  ? Zoster Recombinat (Shingrix) 01/09/2021, 04/17/2021  ? ?   ?Objective:  ? Physical Exam ?BP 120/64 (BP Location: Left Arm, Cuff Size: Normal)   Pulse 76   Temp 97.8 ?F (36.6 ?C) (Temporal)   Ht '5\' 7"'$  (1.702 m)   Wt 154 lb 12.8 oz (70.2 kg)   SpO2 98%   BMI 24.25 kg/m?  ?GENERAL: Thin well-developed gentleman in no acute distress, fully ambulatory.  No conversational dyspnea. ?HEAD: Normocephalic, atraumatic. ?EYES: Pupils equal, round, reactive to light.  No scleral icterus. ?MOUTH: Poor dentition, oral mucosa moist. ?NECK: Supple. No thyromegaly.  Trachea midline. No JVD.  No adenopathy. ?PULMONARY: Good air entry bilaterally.  Coarse, no other adventitious sounds.  ?CARDIOVASCULAR: S1 and S2. Regular rate and rhythm.  No rubs, murmurs or gallops heard. ?ABDOMEN: Benign. ?MUSCULOSKELETAL: No joint deformity, no clubbing, no edema. ?NEUROLOGIC: No focal deficit, no gait disturbance, speech is fluent. ?SKIN: Intact,warm,dry.  On limited exam, no rashes. ?PSYCH: Mood and behavior normal ? ?Representative image from the CT performed 04 January 2022 showing the nodule in question: ? ? ? ? ?   ?Assessment & Plan:  ? ?  ICD-10-CM   ?1. Lung nodule - LEFT UL 1.2 cm X 0.5 cm  R91.1 CT SUPER D CHEST WO MONARCH PILOT  ?  Pulmonary Function Test [facility] Only  ? Hypermetabolic, malignancy suspected ?Robotic assisted bronchoscopy for diagnosis ?Procedure scheduled for 15 February 2019 12:30 PM  ?  ?2. COPD, moderate ([place])  J44.9   ? Breztri 2 puffs twice a day ?Continue albuterol as needed ?Quit smoking  ?  ?3. Tobacco dependence due to  cigarettes  F17.210   ? Patient counseled regards to discontinuation of smoking ?Oral counseling time 3 to 5 minutes  ?  ? ?Orders Placed This Encounter  ?Procedures  ? CT SUPER D CHEST WO MONARCH PILOT  ?  Standing Status:   Future  ?  Standing Expiration Date:   02/01/2023  ?  Scheduling Instructions:  ?   Prior to 02/13/22  ?  Order Specific Question:   Preferred imaging location?  ?  Answer:   [facility]  ? Pulmonary Function Test [facility] Only  ?  Within one month.  ?  Standing Status:   Future  ?  Standing Expiration Date:   02/01/2023  ?  Order Specific Question:   Full PFT: includes the following: basic spirometry, spirometry pre & post bronchodilator, diffusion capacity (DLCO), lung volumes  ?  Answer:   Full PFT  ? ?Patient was provided with 2 samples of Breztri.  He was instructed on the proper use of the inhaler which is 2 puffs twice a day. ? ?Benefits, limitations and potential complications of the procedure were discussed with the  patient/family.  Complications from bronchoscopy are rare and most often minor, but if they occur they may include breathing difficulty, vocal cord spasm, hoarseness, slight fever, vomiting, dizziness, bronchospasm, infect

## 2022-01-31 NOTE — Telephone Encounter (Signed)
Robotic bronchoscopy with navigation and cellvizio scheduled 02/14/2022 at 12:30.  ?[id number], [id number] ?SH:UOHF nodule.  ? ?

## 2022-02-01 NOTE — Telephone Encounter (Addendum)
Phone pre admit visit scheduled 02/07/2022 between 8-1 and covid test 02/12/2022 at 9:00. ? ?Patient is aware of dates/times and voiced his understanding.  ? ?  ?

## 2022-02-05 NOTE — Telephone Encounter (Signed)
Prior Auth Not Required for the codes [phone], [phone] Refer # [id number] CNOB F. 02/05/2022 ?

## 2022-02-07 ENCOUNTER — Other Ambulatory Visit: Payer: Self-pay

## 2022-02-07 ENCOUNTER — Encounter
Admission: RE | Admit: 2022-02-07 | Discharge: 2022-02-07 | Disposition: A | Discharge status: Home / Self Care | Payer: BC Managed Care – PPO | Source: Ambulatory Visit | Attending: Pulmonary Disease | Admitting: Pulmonary Disease

## 2022-02-07 VITALS — Ht 70.0 in | Wt 155.0 lb

## 2022-02-07 DIAGNOSIS — J984 Other disorders of lung: Secondary | ICD-10-CM

## 2022-02-07 HISTORY — DX: Malignant (primary) neoplasm, unspecified: C80.1

## 2022-02-07 NOTE — Patient Instructions (Addendum)
Your procedure is scheduled on: Thursday 02/14/22 ?Report to the Registration Desk on the 1st floor of the [facility]. ?To find out your arrival time, please call [phone] between 1PM - 3PM on: Wednesday 02/13/22 ? ?REMEMBER: ?Instructions that are not followed completely may result in serious medical risk, up to and including death; or upon the discretion of your surgeon and anesthesiologist your surgery may need to be rescheduled. ? ?Do not eat or drink after midnight the night before surgery.  ?No gum chewing, lozengers or hard candies. ? ?TAKE THESE MEDICATIONS THE MORNING OF SURGERY WITH A SIP OF WATER: ?None ? ?Use your albuterol (VENTOLIN HFA) 108 (90 Base) MCG/ACT inhaler and Budeson-Glycopyrrol-Formoterol (BREZTRI AEROSPHERE) 160-9-4.8 MCG/ACT AERO inhalers on the day of surgery and bring the Albuterol to the hospital. ? ?One week prior to surgery: ?Stop Anti-inflammatories (NSAIDS) such as Advil, Aleve, Ibuprofen, Motrin, Naproxen, Naprosyn and Aspirin based products such as Excedrin, Goodys Powder, BC Powder. ? ?Stop ANY OVER THE COUNTER supplements until after surgery. ? ?You may however, continue to take Tylenol if needed for pain up until the day of surgery. ? ?No Alcohol for 24 hours before or after surgery. ? ?No Smoking including e-cigarettes for 24 hours prior to surgery.  ?No chewable tobacco products for at least 6 hours prior to surgery.  ?No nicotine patches on the day of surgery. ? ?Do not use any "recreational" drugs for at least a week prior to your surgery.  ?Please be advised that the combination of cocaine and anesthesia may have negative outcomes, up to and including death. ?If you test positive for cocaine, your surgery will be cancelled. ? ?On the morning of surgery brush your teeth with toothpaste and water, you may rinse your mouth with mouthwash if you wish. ?Do not swallow any toothpaste or mouthwash. ? ?Do not wear jewelry. ? ?Do not wear lotions, powders, or colognes.   ? ?Do not shave body from the neck down 48 hours prior to surgery just in case you cut yourself which could leave a site for infection.  ?Also, freshly shaved skin may become irritated if using the CHG soap. ? ?Do not bring valuables to the hospital. [facility] is not responsible for any missing/lost belongings or valuables.  ? ?Notify your doctor if there is any change in your medical condition (cold, fever, infection). ? ?Wear comfortable clothing (specific to your surgery type) to the hospital. ? ?If you are being discharged the day of surgery, you will not be allowed to drive home. ?You will need a responsible adult (18 years or older) to drive you home and stay with you that night.  ? ?If you are taking public transportation, you will need to have a responsible adult (18 years or older) with you. ?Please confirm with your physician that it is acceptable to use public transportation.  ? ?Please call the [facility] Dept. at [phone] if you have any questions about these instructions. ? ?Surgery Visitation Policy: ? ?Patients undergoing a surgery or procedure may have two family members or support persons with them as long as the person is not COVID-19 positive or experiencing its symptoms.  ? ?Inpatient Visitation:   ? ?Visiting hours are 7 a.m. to 8 p.m. ?Up to four visitors are allowed at one time in a patient room, including children. The visitors may rotate out with other people during the day. One designated support person (adult) may remain overnight.  ?

## 2022-02-08 ENCOUNTER — Telehealth: Payer: Self-pay | Admitting: Pulmonary Disease

## 2022-02-09 ENCOUNTER — Other Ambulatory Visit: Payer: Self-pay | Admitting: Pulmonary Disease

## 2022-02-09 DIAGNOSIS — J449 Chronic obstructive pulmonary disease, unspecified: Secondary | ICD-10-CM

## 2022-02-11 MED ORDER — BREZTRI AEROSPHERE 160-9-4.8 MCG/ACT IN AERO
2.0000 | INHALATION_SPRAY | Freq: Two times a day (BID) | RESPIRATORY_TRACT | 0 refills | Status: DC
Start: 2022-02-11 — End: 2022-04-04

## 2022-02-11 MED ORDER — BREZTRI AEROSPHERE 160-9-4.8 MCG/ACT IN AERO: 2.0000 | INHALATION_SPRAY | Freq: Two times a day (BID) | RESPIRATORY_TRACT | 11 refills | Status: DC

## 2022-02-11 MED ORDER — ALBUTEROL SULFATE HFA 108 (90 BASE) MCG/ACT IN AERS: 2.0000 | INHALATION_SPRAY | Freq: Four times a day (QID) | RESPIRATORY_TRACT | 0 refills | Status: DC | PRN

## 2022-02-11 NOTE — Telephone Encounter (Signed)
Breztri and albuterol sent to preferred pharmacy. ?Patient is aware and voiced his understanding.  ?Nothing further needed.  ? ?

## 2022-02-12 ENCOUNTER — Other Ambulatory Visit
Admission: RE | Admit: 2022-02-12 | Discharge: 2022-02-12 | Disposition: A | Discharge status: Home / Self Care | Payer: BC Managed Care – PPO | Source: Ambulatory Visit | Attending: Pulmonary Disease | Admitting: Pulmonary Disease

## 2022-02-12 DIAGNOSIS — J984 Other disorders of lung: Secondary | ICD-10-CM | POA: Insufficient documentation

## 2022-02-12 DIAGNOSIS — Z01818 Encounter for other preprocedural examination: Secondary | ICD-10-CM | POA: Insufficient documentation

## 2022-02-12 DIAGNOSIS — Z01810 Encounter for preprocedural cardiovascular examination: Secondary | ICD-10-CM | POA: Diagnosis not present

## 2022-02-12 DIAGNOSIS — Z20822 Contact with and (suspected) exposure to covid-19: Secondary | ICD-10-CM | POA: Insufficient documentation

## 2022-02-12 DIAGNOSIS — Z1152 Encounter for screening for COVID-19: Secondary | ICD-10-CM

## 2022-02-12 LAB — CBC
HCT: 48.4 % (ref 39.0–52.0)
Hemoglobin: 16.3 g/dL (ref 13.0–17.0)
MCH: 32.3 pg (ref 26.0–34.0)
MCHC: 33.7 g/dL (ref 30.0–36.0)
MCV: 95.8 fL (ref 80.0–100.0)
Platelets: 241 10*3/uL (ref 150–400)
RBC: 5.05 MIL/uL (ref 4.22–5.81)
RDW: 12.4 % (ref 11.5–15.5)
WBC: 5.2 10*3/uL (ref 4.0–10.5)
nRBC: 0 % (ref 0.0–0.2)

## 2022-02-12 LAB — SARS CORONAVIRUS 2 (TAT 6-24 HRS): SARS Coronavirus 2: NEGATIVE

## 2022-02-13 ENCOUNTER — Ambulatory Visit
Admission: RE | Admit: 2022-02-13 | Discharge: 2022-02-13 | Disposition: A | Discharge status: Home / Self Care | Payer: BC Managed Care – PPO | Source: Ambulatory Visit | Attending: Pulmonary Disease | Admitting: Pulmonary Disease

## 2022-02-13 DIAGNOSIS — R911 Solitary pulmonary nodule: Secondary | ICD-10-CM | POA: Insufficient documentation

## 2022-02-14 ENCOUNTER — Ambulatory Visit: Payer: BC Managed Care – PPO

## 2022-02-14 ENCOUNTER — Ambulatory Visit: Payer: BC Managed Care – PPO | Admitting: Certified Registered"

## 2022-02-14 ENCOUNTER — Encounter
Admission: RE | Disposition: A | Discharge status: Home / Self Care | Payer: Self-pay | Source: Home / Self Care | Attending: Pulmonary Disease

## 2022-02-14 ENCOUNTER — Encounter: Payer: Self-pay | Admitting: Pulmonary Disease

## 2022-02-14 ENCOUNTER — Other Ambulatory Visit: Payer: Self-pay

## 2022-02-14 ENCOUNTER — Inpatient Hospital Stay
Admission: RE | Admit: 2022-02-14 | Discharge: 2022-02-15 | DRG: 168 | Disposition: A | Discharge status: Home / Self Care | Payer: BC Managed Care – PPO | Attending: Pulmonary Disease | Admitting: Pulmonary Disease

## 2022-02-14 DIAGNOSIS — F17210 Nicotine dependence, cigarettes, uncomplicated: Secondary | ICD-10-CM | POA: Diagnosis present

## 2022-02-14 DIAGNOSIS — Z20822 Contact with and (suspected) exposure to covid-19: Secondary | ICD-10-CM | POA: Diagnosis present

## 2022-02-14 DIAGNOSIS — Y848 Other medical procedures as the cause of abnormal reaction of the patient, or of later complication, without mention of misadventure at the time of the procedure: Secondary | ICD-10-CM | POA: Diagnosis present

## 2022-02-14 DIAGNOSIS — Z8042 Family history of malignant neoplasm of prostate: Secondary | ICD-10-CM

## 2022-02-14 DIAGNOSIS — F17200 Nicotine dependence, unspecified, uncomplicated: Secondary | ICD-10-CM | POA: Diagnosis present

## 2022-02-14 DIAGNOSIS — Z833 Family history of diabetes mellitus: Secondary | ICD-10-CM

## 2022-02-14 DIAGNOSIS — Z801 Family history of malignant neoplasm of trachea, bronchus and lung: Secondary | ICD-10-CM

## 2022-02-14 DIAGNOSIS — Z8249 Family history of ischemic heart disease and other diseases of the circulatory system: Secondary | ICD-10-CM | POA: Diagnosis not present

## 2022-02-14 DIAGNOSIS — J432 Centrilobular emphysema: Secondary | ICD-10-CM | POA: Diagnosis present

## 2022-02-14 DIAGNOSIS — R911 Solitary pulmonary nodule: Secondary | ICD-10-CM

## 2022-02-14 DIAGNOSIS — J95811 Postprocedural pneumothorax: Secondary | ICD-10-CM | POA: Diagnosis present

## 2022-02-14 DIAGNOSIS — Z807 Family history of other malignant neoplasms of lymphoid, hematopoietic and related tissues: Secondary | ICD-10-CM

## 2022-02-14 DIAGNOSIS — K219 Gastro-esophageal reflux disease without esophagitis: Secondary | ICD-10-CM | POA: Diagnosis present

## 2022-02-14 DIAGNOSIS — Z800 Family history of malignant neoplasm of digestive organs: Secondary | ICD-10-CM | POA: Diagnosis not present

## 2022-02-14 LAB — BASIC METABOLIC PANEL
Anion gap: 5 (ref 5–15)
BUN: 20 mg/dL (ref 8–23)
CO2: 29 mmol/L (ref 22–32)
Calcium: 8.9 mg/dL (ref 8.9–10.3)
Chloride: 105 mmol/L (ref 98–111)
Creatinine, Ser: 1.05 mg/dL (ref 0.61–1.24)
GFR, Estimated: >60 mL/min (ref >60)
Glucose, Bld: 119 mg/dL — ABNORMAL HIGH (ref 70–99)
Potassium: 4.5 mmol/L (ref 3.5–5.1)
Sodium: 139 mmol/L (ref 135–145)

## 2022-02-14 LAB — CBC WITH DIFFERENTIAL/PLATELET
Abs Immature Granulocytes: 0.07 10*3/uL (ref 0.00–0.07)
Basophils Absolute: 0.1 10*3/uL (ref 0.0–0.1)
Basophils Relative: 0 %
Eosinophils Absolute: 0 10*3/uL (ref 0.0–0.5)
Eosinophils Relative: 0 %
HCT: 46.4 % (ref 39.0–52.0)
Hemoglobin: 15.5 g/dL (ref 13.0–17.0)
Immature Granulocytes: 1 %
Lymphocytes Relative: 4 %
Lymphs Abs: 0.5 10*3/uL — ABNORMAL LOW (ref 0.7–4.0)
MCH: 32.2 pg (ref 26.0–34.0)
MCHC: 33.4 g/dL (ref 30.0–36.0)
MCV: 96.5 fL (ref 80.0–100.0)
Monocytes Absolute: 0.1 10*3/uL (ref 0.1–1.0)
Monocytes Relative: 1 %
Neutro Abs: 13.3 10*3/uL — ABNORMAL HIGH (ref 1.7–7.7)
Neutrophils Relative %: 94 %
Platelets: 239 10*3/uL (ref 150–400)
RBC: 4.81 MIL/uL (ref 4.22–5.81)
RDW: 12.7 % (ref 11.5–15.5)
WBC: 14.1 10*3/uL — ABNORMAL HIGH (ref 4.0–10.5)
nRBC: 0 % (ref 0.0–0.2)

## 2022-02-14 LAB — MRSA NEXT GEN BY PCR, NASAL: MRSA by PCR Next Gen: NOT DETECTED

## 2022-02-14 LAB — PHOSPHORUS: Phosphorus: 3 mg/dL (ref 2.5–4.6)

## 2022-02-14 LAB — MAGNESIUM: Magnesium: 2 mg/dL (ref 1.7–2.4)

## 2022-02-14 LAB — GLUCOSE, CAPILLARY: Glucose-Capillary: 120 mg/dL — ABNORMAL HIGH (ref 70–99)

## 2022-02-14 SURGERY — BRONCHOSCOPY, WITH BIOPSY USING ELECTROMAGNETIC NAVIGATION
Anesthesia: General

## 2022-02-14 MED ORDER — DEXAMETHASONE SODIUM PHOSPHATE 10 MG/ML IJ SOLN
INTRAMUSCULAR | Status: DC | PRN
Start: 2022-02-14 — End: 2022-02-14
  Administered 2022-02-14: 10 mg via INTRAVENOUS

## 2022-02-14 MED ORDER — IPRATROPIUM-ALBUTEROL 0.5-2.5 (3) MG/3ML IN SOLN: 3.0000 mL | Freq: Once | RESPIRATORY_TRACT | Status: AC

## 2022-02-14 MED ORDER — ROCURONIUM BROMIDE 100 MG/10ML IV SOLN
INTRAVENOUS | Status: DC | PRN
Start: 2022-02-14 — End: 2022-02-14
  Administered 2022-02-14: 10 mg via INTRAVENOUS
  Administered 2022-02-14: 50 mg via INTRAVENOUS

## 2022-02-14 MED ORDER — OXYCODONE-ACETAMINOPHEN 5-325 MG PO TABS
1.0000 | ORAL_TABLET | ORAL | Status: DC | PRN
  Administered 2022-02-15 (×3): 1 via ORAL
  Filled 2022-02-14 (×3): qty 1

## 2022-02-14 MED ORDER — OXYCODONE-ACETAMINOPHEN 5-325 MG PO TABS
1.0000 | ORAL_TABLET | Freq: Four times a day (QID) | ORAL | Status: DC | PRN
  Administered 2022-02-14 (×2): 1 via ORAL
  Filled 2022-02-14: qty 1

## 2022-02-14 MED ORDER — FAMOTIDINE 20 MG PO TABS
ORAL_TABLET | ORAL | Status: AC
  Administered 2022-02-14: 20 mg via ORAL
  Filled 2022-02-14: qty 1

## 2022-02-14 MED ORDER — ONDANSETRON HCL 4 MG/2ML IJ SOLN
INTRAMUSCULAR | Status: AC
  Filled 2022-02-14: qty 2

## 2022-02-14 MED ORDER — ACETAMINOPHEN 325 MG PO TABS: 650.0000 mg | ORAL_TABLET | Freq: Four times a day (QID) | ORAL | Status: DC | PRN

## 2022-02-14 MED ORDER — LIDOCAINE HCL (PF) 2 % IJ SOLN
INTRAMUSCULAR | Status: AC
  Filled 2022-02-14: qty 5

## 2022-02-14 MED ORDER — DEXAMETHASONE SODIUM PHOSPHATE 10 MG/ML IJ SOLN
INTRAMUSCULAR | Status: AC
  Filled 2022-02-14: qty 1

## 2022-02-14 MED ORDER — CHLORHEXIDINE GLUCONATE 0.12 % MT SOLN: 15.0000 mL | Freq: Once | OROMUCOSAL | Status: AC

## 2022-02-14 MED ORDER — PROPOFOL 10 MG/ML IV BOLUS
INTRAVENOUS | Status: DC | PRN
  Administered 2022-02-14: 150 mg via INTRAVENOUS

## 2022-02-14 MED ORDER — HYDROMORPHONE HCL 1 MG/ML IJ SOLN
1.0000 mg | Freq: Once | INTRAMUSCULAR | Status: AC
  Administered 2022-02-14: 1 mg via INTRAVENOUS
  Filled 2022-02-14: qty 1

## 2022-02-14 MED ORDER — IPRATROPIUM-ALBUTEROL 0.5-2.5 (3) MG/3ML IN SOLN
3.0000 mL | RESPIRATORY_TRACT | Status: DC
  Administered 2022-02-14 – 2022-02-15 (×4): 3 mL via RESPIRATORY_TRACT
  Filled 2022-02-14 (×4): qty 3

## 2022-02-14 MED ORDER — CHLORHEXIDINE GLUCONATE CLOTH 2 % EX PADS
6.0000 | MEDICATED_PAD | Freq: Every day | CUTANEOUS | Status: DC
  Administered 2022-02-14 (×2): 6 via TOPICAL

## 2022-02-14 MED ORDER — FENTANYL CITRATE (PF) 100 MCG/2ML IJ SOLN
INTRAMUSCULAR | Status: DC | PRN
  Administered 2022-02-14: 25 ug via INTRAVENOUS
  Administered 2022-02-14: 50 ug via INTRAVENOUS
  Administered 2022-02-14: 25 ug via INTRAVENOUS

## 2022-02-14 MED ORDER — LIDOCAINE HCL (CARDIAC) PF 100 MG/5ML IV SOSY
PREFILLED_SYRINGE | INTRAVENOUS | Status: DC | PRN
Start: 2022-02-14 — End: 2022-02-14
  Administered 2022-02-14: 100 mg via INTRAVENOUS

## 2022-02-14 MED ORDER — FENTANYL CITRATE (PF) 100 MCG/2ML IJ SOLN
INTRAMUSCULAR | Status: AC
  Administered 2022-02-14: 50 ug via INTRAVENOUS
  Filled 2022-02-14: qty 2

## 2022-02-14 MED ORDER — PHENYLEPHRINE 80 MCG/ML (10ML) SYRINGE FOR IV PUSH (FOR BLOOD PRESSURE SUPPORT)
PREFILLED_SYRINGE | INTRAVENOUS | Status: AC
  Filled 2022-02-14: qty 10

## 2022-02-14 MED ORDER — MIDAZOLAM HCL 2 MG/2ML IJ SOLN
INTRAMUSCULAR | Status: DC | PRN
  Administered 2022-02-14: 2 mg via INTRAVENOUS

## 2022-02-14 MED ORDER — MIDAZOLAM HCL 2 MG/2ML IJ SOLN: 2.0000 mg | Freq: Once | INTRAMUSCULAR | Status: AC

## 2022-02-14 MED ORDER — LIDOCAINE HCL (PF) 2 % IJ SOLN
INTRAMUSCULAR | Status: AC
  Filled 2022-02-14: qty 10

## 2022-02-14 MED ORDER — FENTANYL CITRATE (PF) 100 MCG/2ML IJ SOLN: 50.0000 ug | Freq: Once | INTRAMUSCULAR | Status: AC

## 2022-02-14 MED ORDER — IPRATROPIUM-ALBUTEROL 0.5-2.5 (3) MG/3ML IN SOLN
RESPIRATORY_TRACT | Status: AC
  Administered 2022-02-14: 3 mL via RESPIRATORY_TRACT
  Filled 2022-02-14: qty 3

## 2022-02-14 MED ORDER — ONDANSETRON HCL 4 MG/2ML IJ SOLN
INTRAMUSCULAR | Status: DC | PRN
  Administered 2022-02-14: 4 mg via INTRAVENOUS

## 2022-02-14 MED ORDER — LACTATED RINGERS IV SOLN: INTRAVENOUS | Status: DC

## 2022-02-14 MED ORDER — FAMOTIDINE 20 MG PO TABS: 20.0000 mg | ORAL_TABLET | Freq: Once | ORAL | Status: AC

## 2022-02-14 MED ORDER — MIDAZOLAM HCL 2 MG/2ML IJ SOLN
INTRAMUSCULAR | Status: AC
  Administered 2022-02-14: 2 mg via INTRAVENOUS
  Filled 2022-02-14: qty 2

## 2022-02-14 MED ORDER — BUDESONIDE 0.5 MG/2ML IN SUSP
0.5000 mg | Freq: Two times a day (BID) | RESPIRATORY_TRACT | Status: DC
  Administered 2022-02-14 – 2022-02-15 (×2): 0.5 mg via RESPIRATORY_TRACT
  Filled 2022-02-14 (×2): qty 2

## 2022-02-14 MED ORDER — PHENYLEPHRINE HCL (PRESSORS) 10 MG/ML IV SOLN
INTRAVENOUS | Status: DC | PRN
Start: 2022-02-14 — End: 2022-02-14
  Administered 2022-02-14: 80 ug via INTRAVENOUS

## 2022-02-14 MED ORDER — ORAL CARE MOUTH RINSE: 15.0000 mL | Freq: Once | OROMUCOSAL | Status: AC

## 2022-02-14 MED ORDER — CHLORHEXIDINE GLUCONATE 0.12 % MT SOLN
OROMUCOSAL | Status: AC
  Administered 2022-02-14: 15 mL via OROMUCOSAL
  Filled 2022-02-14: qty 15

## 2022-02-14 MED ORDER — PROPOFOL 10 MG/ML IV BOLUS
INTRAVENOUS | Status: AC
  Filled 2022-02-14: qty 20

## 2022-02-14 MED ORDER — FENTANYL CITRATE (PF) 100 MCG/2ML IJ SOLN
INTRAMUSCULAR | Status: AC
  Filled 2022-02-14: qty 2

## 2022-02-14 MED ORDER — NICOTINE 21 MG/24HR TD PT24
21.0000 mg | MEDICATED_PATCH | Freq: Every day | TRANSDERMAL | Status: DC
  Administered 2022-02-14 – 2022-02-15 (×2): 21 mg via TRANSDERMAL
  Filled 2022-02-14 (×2): qty 1

## 2022-02-14 MED ORDER — PROMETHAZINE HCL 25 MG/ML IJ SOLN: 6.2500 mg | INTRAMUSCULAR | Status: DC | PRN

## 2022-02-14 MED ORDER — SODIUM CHLORIDE 0.9 % IV SOLN: Freq: Once | INTRAVENOUS | Status: AC

## 2022-02-14 MED ORDER — SUGAMMADEX SODIUM 200 MG/2ML IV SOLN
INTRAVENOUS | Status: DC | PRN
  Administered 2022-02-14: 200 mg via INTRAVENOUS

## 2022-02-14 MED ORDER — MIDAZOLAM HCL 2 MG/2ML IJ SOLN
INTRAMUSCULAR | Status: AC
Start: 2022-02-14 — End: ?
  Filled 2022-02-14: qty 2

## 2022-02-14 MED ORDER — ACETAMINOPHEN 10 MG/ML IV SOLN: 1000.0000 mg | Freq: Once | INTRAVENOUS | Status: DC | PRN

## 2022-02-14 MED ORDER — ROCURONIUM BROMIDE 10 MG/ML (PF) SYRINGE
PREFILLED_SYRINGE | INTRAVENOUS | Status: AC
  Filled 2022-02-14: qty 10

## 2022-02-14 NOTE — Interval H&P Note (Signed)
History and Physical Interval Note: ? ?02/14/2022 ?11:14 AM ? ?Paul Fields  has presented today for surgery, with the diagnosis of lung mass.  The various methods of treatment have been discussed with the patient and family. After consideration of risks, benefits and other options for treatment, the patient has consented to  Procedure(s): ?ROBOTIC ASSISTED NAVIGATIONAL BRONCHOSCOPY (N/A) as a surgical intervention.  The patient's history has been reviewed, patient examined, no change in status, stable for surgery.  I have reviewed the patient's chart and labs.  Questions were answered to the patient's satisfaction.   ? ? ?Paul Fields ? ? ?

## 2022-02-14 NOTE — Plan of Care (Signed)

## 2022-02-14 NOTE — H&P (Signed)
? ?NAME:  Paul Fields, MRN:  [id number], DOB:  October 21, 1961, LOS: 0 ?ADMISSION DATE:  02/14/2022, CONSULTATION DATE: 02/14/2022 ?REFERRING MD: Dr. Galen Daft, CHIEF COMPLAINT: Lung Mass   ? ?History of Present Illness:  ?This is a 61 yo male with a PMH of current everyday smoker (1 PPD), COPD, and left upper lobe lung nodule.  He has had bilateral lung nodules that have been followed expectantly.  Initially he had been following with Cardiothoracic Surgeon Dr. [name] for this issue.  He had a chest CT scheduled in November 2022 which showed a new LEFT upper lobe nodule that was 7 mm and recommendation for 3 to 47-monthfollow-up was made.  We contacted the patient and he was scheduled for CT.  He missed follow-up appointments.  His follow-up CT was not done till 10 March.  This showed that the LEFT upper lobe nodule had now grown to 1.2 cm x 0.5 cm.  This was followed with a PET/CT on 28 March which shows that this is hypermetabolic and consistent with malignancy.  He was seen in the office by outpatient pulmonologist Dr. GGalen Daftwith recommendations for robotic bronchoscopy of the left upper lobe lung nodule due to concern of possible malignancy.   ? ?On 04/20 pt underwent robotic bronchoscopy of the left upper lung nodule.  Post procedure pt developed left-sided pneumothorax requiring left chest tube placement.  He was subsequently admitted to the stepdown unit postprocedure for monitoring and treatment.   ? ?Pertinent  Medical History  ?Arthritis ?Diverticulosis ?Centrilobular Emphysema  ?Recurrent Major Depressive Disorder ?GERD ?Adenomatous Colonic Polyps ?Current Everyday Smoker  ? ?Significant Hospital Events: ?Including procedures, antibiotic start and stop dates in addition to other pertinent events   ?04/20: Pt admitted to the stepdown unit s/p robotic bronchoscopy of the left upper lung nodule complicated by left-sided pneumothorax post procedure requiring left chest tube placement  ? ?Interim History /  Subjective:  ?Pt c/o left-sided chest pain at chest tube site ? ?Objective   ?Blood pressure 106/65, pulse 87, temperature 98 ?F (36.7 ?C), resp. rate 13, height '5\' 10"'$  (1.778 m), weight 70.3 kg, SpO2 93 %. ?   ?   ? ?Intake/Output Summary (Last 24 hours) at 02/14/2022 1511 ?Last data filed at 02/14/2022 1421 ?Gross per 24 hour  ?Intake 800 ml  ?Output 0 ml  ?Net 800 ml  ? ?Filed Weights  ? 02/14/22 1116  ?Weight: 70.3 kg  ? ? ?Examination: ?General: Acutely ill appearing male, NAD resting in bed ?HENT: Supple, no JVD  ?Lungs: Diminished throughout, even, non labored left midaxillary chest tube ?Cardiovascular: NSR, rrr, no M/R/G, 2+ radial/1+ distal pulses, no edema  ?Abdomen: +BS x4, soft, non tender, non distended ?Extremities: Normal bulk and tone ?Neuro: Alert and oriented, follows commands, PERRLA  ?GU: Deferred  ? ?Resolved Hospital Problem list   ? ? ?Assessment & Plan:  ?Left upper lobe lung nodule s/p robotic bronchoscopy complicated by left-sided pneumothorax requiring left chest tube placement ?Hx: Centrilobular emphysema and Current everyday smoker  ?- Prn supplemental O2 for dyspnea and/or hypoxia  ?- Scheduled and prn bronchodilator therapy ?- Nebulized steroids  ?- Nicotine patch ?- Smoking cessation counseling provided  ? ?Postop pain  ?- Prn tylenol and percocet for pain management  ? ?Best Practice (right click and "Reselect all SmartList Selections" daily)  ? ?Diet/type: Regular consistency (see orders) ?DVT prophylaxis: SCD ?GI prophylaxis: N/A ?Lines: N/A ?Foley:  N/A ?Code Status:  full code ?Last date of multidisciplinary goals of  care discussion [N/A] ? ?Labs   ?CBC: ?Recent Labs  ?Lab 02/12/22 ?0903  ?WBC 5.2  ?HGB 16.3  ?HCT 48.4  ?MCV 95.8  ?PLT 241  ? ? ?Basic Metabolic Panel: ?No results for input(s): NA, K, CL, CO2, GLUCOSE, BUN, CREATININE, CALCIUM, MG, PHOS in the last 168 hours. ?GFR: ?CrCl cannot be calculated (Patient's most recent lab result is older than the maximum 21 days  allowed.). ?Recent Labs  ?Lab 02/12/22 ?0903  ?WBC 5.2  ? ? ?Liver Function Tests: ?No results for input(s): AST, ALT, ALKPHOS, BILITOT, PROT, ALBUMIN in the last 168 hours. ?No results for input(s): LIPASE, AMYLASE in the last 168 hours. ?No results for input(s): AMMONIA in the last 168 hours. ? ?ABG ?No results found for: PHART, PCO2ART, PO2ART, HCO3, TCO2, ACIDBASEDEF, O2SAT  ? ?Coagulation Profile: ?No results for input(s): INR, PROTIME in the last 168 hours. ? ?Cardiac Enzymes: ?No results for input(s): CKTOTAL, CKMB, CKMBINDEX, TROPONINI in the last 168 hours. ? ?HbA1C: ?Hgb A1c MFr Bld  ?Date/Time Value Ref Range Status  ?12/14/2019 11:46 AM 6.3 4.6 - 6.5 % Final  ?  Comment:  ?  Glycemic Control Guidelines for People with Diabetes:Non Diabetic:  <6%Goal of Therapy: <7%Additional Action Suggested:  >8%   ? ? ?CBG: ?No results for input(s): GLUCAP in the last 168 hours. ? ?Review of Systems: Positives in BOLD   ?Gen: Denies fever, chills, weight change, fatigue, night sweats ?HEENT: Denies blurred vision, double vision, hearing loss, tinnitus, sinus congestion, rhinorrhea, sore throat, neck stiffness, dysphagia ?PULM: Denies shortness of breath, cough, sputum production, hemoptysis, wheezing ?CV: left-sided chest pain at chest tube site, edema, orthopnea, paroxysmal nocturnal dyspnea, palpitations ?GI: Denies abdominal pain, nausea, vomiting, diarrhea, hematochezia, melena, constipation, change in bowel habits ?GU: Denies dysuria, hematuria, polyuria, oliguria, urethral discharge ?Endocrine: Denies hot or cold intolerance, polyuria, polyphagia or appetite change ?Derm: Denies rash, dry skin, scaling or peeling skin change ?Heme: Denies easy bruising, bleeding, bleeding gums ?Neuro: Denies headache, numbness, weakness, slurred speech, loss of memory or consciousness ? ?Past Medical History:  ?He,  has a past medical history of Arthritis, Cancer ([place]), Clavicle fracture (1986), COPD (chronic obstructive  pulmonary disease) ([place]), Diverticulosis, Elbow fracture, right (1968), Emphysema lung ([place]), Hemorrhoid, Major depressive disorder, recurrent episode ([place]) (03/07/2016), and Polyp of colon, adenomatous.  ? ?Surgical History:  ? ?Past Surgical History:  ?Procedure Laterality Date  ? COLONOSCOPY    ? COLONOSCOPY W/ BIOPSIES  04/2015  ? polyps-benign  ? ELBOW FRACTURE SURGERY Right 1967  ? Pins, then hardware removal  ? ELBOW HARDWARE REMOVAL Right 1969  ? right  ? POLYPECTOMY    ? polyps  2013  ? 20 polyps biopsies as tubular adenomas  ? right toe surgery  2018  ? [place] EXTRACTION  1983  ?  ? ?Social History:  ? reports that he has been smoking cigarettes. He has a 135.00 pack-year smoking history. He has never used smokeless tobacco. He reports that he does not drink alcohol and does not use drugs.  ? ?Family History:  ?His family history includes Alzheimer's disease in his paternal aunt and paternal aunt; Arthritis in his father; Brain cancer (age of onset: 32) in his paternal uncle; COPD in his brother, father, and paternal uncle; Cancer in his mother and paternal grandfather; Colon cancer in his maternal uncle; Diabetes in his mother; Heart attack in his paternal uncle; Lung cancer in his maternal grandfather and mother; Lymphoma in his cousin; Parkinsonism in his mother;  Prostate cancer in his maternal uncle. There is no history of Heart disease, Stomach cancer, Rectal cancer, Colon polyps, or Esophageal cancer.  ? ?Allergies ?No Known Allergies  ? ?Home Medications  ?Prior to Admission medications   ?Medication Sig Start Date End Date Taking? Authorizing Provider  ?albuterol (VENTOLIN HFA) 108 (90 Base) MCG/ACT inhaler Inhale 2 puffs into the lungs every 6 (six) hours as needed for wheezing or shortness of breath. 02/11/22  Yes Flora Lipps, MD  ?Budeson-Glycopyrrol-Formoterol (BREZTRI AEROSPHERE) 160-9-4.8 MCG/ACT AERO Inhale 2 puffs into the lungs in the morning and at bedtime. 02/11/22  Yes Flora Lipps, MD  ?Budeson-Glycopyrrol-Formoterol (BREZTRI AEROSPHERE) 160-9-4.8 MCG/ACT AERO Inhale 2 puffs into the lungs in the morning and at bedtime. 02/11/22   Tyler Pita, MD  ?  ? ?Critical care time: 40 minutes

## 2022-02-14 NOTE — Procedures (Signed)
Chest Tube Insertion: ?Indication: Pneumothorax postbiopsy ?Consent: On chart ? ?Risks and benefits explained in detail including risk of infection, bleeding, respiratory failure and death. ? ?Hand washing performed prior to starting the procedure.  ? ?Type of Anesthesia: 1 % Lidocaine, local.  Patient received 2 mg Versed IV x1 and 50 mcg fentanyl IV x1. ? ?Procedure: An active timeout was performed and correct patient, name, & ID confirmed.  After explaining risks and benefits, patient positioned correctly for chest tube placement. Patient was prepped in a sterile fashion including chlorohexadine preps, sterile drape, sterile gown and sterile gloves. Introducer needle was placed and guide wire was inserted. Using Seldinger Technique, the introducer needle was removed and three dilators were used to create an opening for insertion of a #14 [place] pneumothorax pigtail chest tube. Chest tube sutured in place and proper dressing used. ? ?Findings: Aspiration of air between second and third ICS midclavicular line, ? ?Chest tube connected to: Heimlich valve.  ? ?Number of Attempts: 1 ? ?Complications: None ? ?Estimated Blood Loss: Nil ? ?Chest radiograph postprocedure showed only trace apical pneumothorax: ? ? ? ?Operator: Patsey Berthold ? ?C. Derrill Kay, MD ?Advanced Bronchoscopy ?[facility] ? ?  ?

## 2022-02-14 NOTE — Op Note (Signed)
PROCEDURES: ? ?Robotic assisted bronchoscopy ?Cellvizio probe based confocal laser endomicroscopy (pCLE) ?Augmented fluoroscopy ? ? ?Indication: Left upper lobe FDG avid nodule 1.2 cm x 0.5 cm, rule out carcinoma ? ?Preoperative Diagnosis: Left upper lobe nodule, rule out carcinoma ?Post Procedure Diagnosis: Same as as above ?Consent: Verbal/Written: obtained ? ?Benefits, limitations and potential complications of the procedure were discussed with the patient/family.  Complications from bronchoscopy are rare and most often minor, but if they occur they may include breathing difficulty, vocal cord spasm, hoarseness, slight fever, vomiting, dizziness, bronchospasm, infection, low blood oxygen, bleeding from biopsy site, or an allergic reaction to medications.  It is uncommon for patients to experience other more serious complications for example: Collapsed lung requiring chest tube placement, respiratory failure, heart attack and/or cardiac arrhythmia.  Patient understood the potential complications and agreed to proceed. ? ?Surgeon: Renold Don, MD ?Assistant/Scrub: Liborio Nixon, RRT,Chris Andree Elk, RRT student ?Circulator: N/A ?Anesthesiologist/CRNA: Iran Ouch, MD/Munesh Pidana, CRNA ?Cytotechnology: [facility] ?Fluoroscopy technician: Ernestina Patches, RT ?Representatives: Shon Millet, Katrine Coho ([employer]) ? ?Type of Anesthesia: General endotracheal ? ?Procedures Performed:   ?Robotic bronchoscopy: Procedure consists of robotic navigation comprised of electromagnetics, optical pattern recognition and robotic kinematic data - to triangulate bronchoscope location during the procedure and provide accurate positional data to biopsy a lesion. ?Cellvizio probe based confocal laser endomicroscopy (pCLE) utilizing blue laser endomicroscopy. ?Augmented fluoroscopy with Body Vision. ? ?Description of Procedure:  ?Robotic bronchoscopy: ?The patient was brought to Procedure Room 2 (Bronchoscopy Suite) in the  OR area where appropriate timeout was taken with the staff after the patient was inducted under general anesthesia.  The patient was inducted under general anesthesia and intubated by the anesthesia team.  Patient was intubated with a 9.0 ET tube without difficulty.  Tube was secured at 4 cm above the carina.  A Portex adapter was placed on the ET tube flange.  Once the patient was under adequate general anesthesia the Olympus therapeutic video bronchoscope was advanced and an anatomic airway tour and surveillance bronchoscopy was performed.The mid trachea appeared very tortuous, the distal trachea appeared unremarkable. The main carina was sharp.  No secretions were seen in either right or left mainstem bronchi. The RUL, RLL, RML appeared to be free of endobronchial masses, lesions, or purulent secretions. Likewise, the LLL/LUL appeared to be free of endobronchial masses, lesions, or purulent secretions.  There were some scant benign appearing secretions on the lingula and left lower lobe bronchi as well as the right middle and right lower lobe bronchi these were suctioned until cleared.  Once the survey bronchoscopy was completed, registration for the augmented fluoroscopy (Body Vision) was then performed with the fluoroscopic C arm.  Once this was completed, the robotic bronchoscope ET tube adapter was placed and ETT was cut to proper length and secured on the mid plane.  The [facility] robotic scope was then advanced through the ETT and registration was performed successfully.  There was good correlation between the robotic mapping and bronchoscopic mapping. With the assistance of fused navigation, the bronchoscope was advanced to the LUL nodule/mass. The tip of the working channel sat within 14 mm of the nodule  Positioning was confirmed with augmented fluoroscopy.  At this point Cellvizio probe based confocal laser endomicroscopy (pCLE) was utilized to confirm target acquisition.  The images through [facility]  were consistent with acquisition of target lesion.   Augmented fluoroscopy via Body Vision was utilized to optimize the position most favorable for biopsies, then the robotic bronchoscope  was anchored to maintain position.  A total of 12 transbronchial biopsies were obtained at this point, the lesion could be seen moving during biopsies.  ROSE was performed on 2 of the biopsy specimens, 1 yielding atypical cells.  After confirmation of excellent hemostasis, a total of 2 passes with a cytology brush on the LUL nodule/mass were performed, the brush was then cut into CytoLyt preservative.  A BAL was also obtained at this segment, which sent for cytology analysis.  For BAL sample acquisition purposes, 40 ml of normal saline were instilled, and approximately 8 mL  were recovered/trapped and sent for analysis.  ? ?The robotic bronchoscope was then retracted all the way out after confirmation of excellent hemostasis.  The patient received bronchial lavage with 9 mL of 1% lidocaine via the ET tube. The patient tolerated the procedure well. No significant bleeding was observed at the conclusion of the procedure.  At this point, the patient was allowed to emerge from general anesthesia, and was extubated in the procedure room without incident.  The patient  was taken to the PACU in satisfactory condition.  Auscultation of the lungs showed diminished breath sounds on left upper lung zone compared to pre bronchoscopy examination.  Patient tolerated the procedure well with no untoward effects of anesthesia noted.  No respiratory distress noted. ?  ?Specimens Obtained: ? ?Transbronchial Forceps Biopsy: X12 ? ?Transbronchial Brush: X 2 ? ?Targeted BAL: 8 mL ? ?Fluoroscopy: Augmented fluoroscopy (Body Vision) was utilized during the course of this procedure to assure that biopsies were taken in a safe manner under fluoroscopic guidance with spot films required.  Total fluoroscopy time: 2 minutes 12 seconds, total dose 33.03  mGy. ? ?Intraoperative images: ? ?Augmented fluoroscopy image showing Cellvizio probe within lesion: ? ? ?Robotic images showing acquisition of target: ? ? ? ? ?Cellvizio images, as below edge of lesion on first image, image 2 and 3 with highly suspicious changes concerning for malignancy, image four most solid part of the lesion: ?1 ? ? ?2 ? ? ?3 ? ? ?4 ? ? ?Complications: ? ?Pneumothorax postprocedure, 25-30 % ? ? ? ? ? ?Estimated Blood Loss: Nil ?  ? ?Assessment and Plan/Additional Comments: ?Left upper lobe nodule, status post biopsy, rule out cancer ?Postprocedural 25 to 30% pneumothorax ?Chest tube placement ?Await pathology reports ? ? ? ? ?C. Derrill Kay, MD ?Advanced Bronchoscopy ?PCC[facility] ? ? ? ?*This note was dictated using voice recognition software/Dragon.  Despite best efforts to proofread, errors can occur which can change the meaning.  Any change was purely unintentional.  ?

## 2022-02-14 NOTE — Anesthesia Preprocedure Evaluation (Addendum)
Anesthesia Evaluation  ?Patient identified by MRN, date of birth, ID band ?Patient awake ? ? ? ?Reviewed: ?Allergy & Precautions, NPO status , Patient's Chart, lab work & pertinent test results ? ?Airway ?Mallampati: III ? ?TM Distance: >3 FB ?Neck ROM: full ? ? ? Dental ?no notable dental hx. ? ?  ?Pulmonary ?COPD,  COPD inhaler, Current Smoker and Patient abstained from smoking.,  ?Lung Mass ?  ?Pulmonary exam normal ? ? ? ? ? ? ? Cardiovascular ?negative cardio ROS ?Normal cardiovascular exam ? ? ?  ?Neuro/Psych ?PSYCHIATRIC DISORDERS Depression negative neurological ROS ?   ? GI/Hepatic ?negative GI ROS, Neg liver ROS,   ?Endo/Other  ?negative endocrine ROS ? Renal/GU ?  ? ?  ?Musculoskeletal ? ? Abdominal ?Normal abdominal exam  (+)   ?Peds ? Hematology ?negative hematology ROS ?(+)   ?Anesthesia Other Findings ?Past Medical History: ?No date: Arthritis ?No date: Cancer [facility]) ?    Comment:  lung ?1986: Clavicle fracture ?No date: COPD (chronic obstructive pulmonary disease) ([place]) ?No date: Diverticulosis ?1968: Elbow fracture, right ?No date: Emphysema lung ([place]) ?No date: Hemorrhoid ?03/07/2016: Major depressive disorder, recurrent episode ([place]) ?    Comment:  when mom passed  ?No date: Polyp of colon, adenomatous ?    Comment:  possible based on genetic testing ? ?Past Surgical History: ?No date: COLONOSCOPY ?04/2015: COLONOSCOPY W/ BIOPSIES ?    Comment:  polyps-benign ?1967: ELBOW FRACTURE SURGERY; Right ?    Comment:  Pins, then hardware removal ?1969: [place]; Right ?    Comment:  right ?No date: POLYPECTOMY ?2013: polyps ?    Comment:  20 polyps biopsies as tubular adenomas ?2018: right toe surgery ?1983: WISDOM TOOTH EXTRACTION ? ? ? ? Reproductive/Obstetrics ?negative OB ROS ? ?  ? ? ? ? ? ? ? ? ? ? ? ? ? ?  ?  ? ? ? ? ? ? ? ?Anesthesia Physical ?Anesthesia Plan ? ?ASA: 3 ? ?Anesthesia Plan: General ETT  ? ?Post-op Pain Management: Minimal or no pain  anticipated  ? ?Induction: Intravenous ? ?PONV Risk Score and Plan: Ondansetron, Dexamethasone, Midazolam and Treatment may vary due to age or medical condition ? ?Airway Management Planned: Oral ETT ? ?Additional Equipment:  ? ?Intra-op Plan:  ? ?Post-operative Plan: Extubation in OR ? ?Informed Consent: I have reviewed the patients History and Physical, chart, labs and discussed the procedure including the risks, benefits and alternatives for the proposed anesthesia with the patient or authorized representative who has indicated his/her understanding and acceptance.  ? ? ? ?Dental advisory given ? ?Plan Discussed with: Anesthesiologist, CRNA and Surgeon ? ?Anesthesia Plan Comments:   ? ? ? ? ? ?Anesthesia Quick Evaluation ? ?

## 2022-02-14 NOTE — Transfer of Care (Signed)
Immediate Anesthesia Transfer of Care Note ? ?Patient: Paul Fields ? ?Procedure(s) Performed: ROBOTIC ASSISTED NAVIGATIONAL BRONCHOSCOPY ? ?Patient Location: PACU ? ?Anesthesia Type:General ? ?Level of Consciousness: awake ? ?Airway & Oxygen Therapy: Patient Spontanous Breathing and Patient connected to face mask oxygen ? ?Post-op Assessment: Report given to RN and Post -op Vital signs reviewed and stable ? ?Post vital signs: Reviewed and stable ? ?Last Vitals:  ?Vitals Value Taken Time  ?BP 109/61 02/14/22 1423  ?Temp 36.7 ?C 02/14/22 1423  ?Pulse 92 02/14/22 1431  ?Resp 13 02/14/22 1431  ?SpO2 93 % 02/14/22 1431  ?Vitals shown include unvalidated device data. ? ?Last Pain:  ?Vitals:  ? 02/14/22 1423  ?TempSrc:   ?PainSc: 0-No pain  ?   ? ?  ? ?Complications: No notable events documented. ?

## 2022-02-14 NOTE — Anesthesia Procedure Notes (Signed)
Procedure Name: Intubation ?Date/Time: 02/14/2022 12:53 PM ?Performed by: Cammie Sickle, CRNA ?Pre-anesthesia Checklist: Patient identified, Patient being monitored, Timeout performed, Emergency Drugs available and Suction available ?Patient Re-evaluated:Patient Re-evaluated prior to induction ?Oxygen Delivery Method: Circle system utilized ?Preoxygenation: Pre-oxygenation with 100% oxygen ?Induction Type: IV induction ?Ventilation: Mask ventilation without difficulty ?Laryngoscope Size: 3 and McGraph ?Grade View: Grade I ?Tube type: Oral ?Tube size: 9.0 mm ?Number of attempts: 1 ?Airway Equipment and Method: Stylet ?Placement Confirmation: ETT inserted through vocal cords under direct vision, positive ETCO2 and breath sounds checked- equal and bilateral ?Secured at: 21 cm ?Tube secured with: Tape ?Dental Injury: Teeth and Oropharynx as per pre-operative assessment  ? ? ? ? ?

## 2022-02-15 ENCOUNTER — Other Ambulatory Visit: Payer: Self-pay

## 2022-02-15 ENCOUNTER — Other Ambulatory Visit: Payer: Self-pay | Admitting: Pulmonary Disease

## 2022-02-15 ENCOUNTER — Inpatient Hospital Stay: Payer: BC Managed Care – PPO

## 2022-02-15 DIAGNOSIS — J95811 Postprocedural pneumothorax: Secondary | ICD-10-CM | POA: Diagnosis not present

## 2022-02-15 DIAGNOSIS — R911 Solitary pulmonary nodule: Secondary | ICD-10-CM

## 2022-02-15 LAB — CYTOLOGY - NON PAP

## 2022-02-15 LAB — CBC
HCT: 44.8 % (ref 39.0–52.0)
Hemoglobin: 15 g/dL (ref 13.0–17.0)
MCH: 32.3 pg (ref 26.0–34.0)
MCHC: 33.5 g/dL (ref 30.0–36.0)
MCV: 96.6 fL (ref 80.0–100.0)
Platelets: 233 10*3/uL (ref 150–400)
RBC: 4.64 MIL/uL (ref 4.22–5.81)
RDW: 12.8 % (ref 11.5–15.5)
WBC: 10.6 10*3/uL — ABNORMAL HIGH (ref 4.0–10.5)
nRBC: 0 % (ref 0.0–0.2)

## 2022-02-15 LAB — PHOSPHORUS: Phosphorus: 3.9 mg/dL (ref 2.5–4.6)

## 2022-02-15 LAB — BASIC METABOLIC PANEL
Anion gap: 8 (ref 5–15)
BUN: 20 mg/dL (ref 8–23)
CO2: 27 mmol/L (ref 22–32)
Calcium: 8.8 mg/dL — ABNORMAL LOW (ref 8.9–10.3)
Chloride: 103 mmol/L (ref 98–111)
Creatinine, Ser: 0.97 mg/dL (ref 0.61–1.24)
GFR, Estimated: >60 mL/min (ref >60)
Glucose, Bld: 161 mg/dL — ABNORMAL HIGH (ref 70–99)
Potassium: 4.4 mmol/L (ref 3.5–5.1)
Sodium: 138 mmol/L (ref 135–145)

## 2022-02-15 LAB — MAGNESIUM: Magnesium: 2.2 mg/dL (ref 1.7–2.4)

## 2022-02-15 LAB — SURGICAL PATHOLOGY

## 2022-02-15 NOTE — Plan of Care (Signed)
Continuing with plan of care.   ?Earlier this morning chest tube was removed by Dr. Patsey Berthold, patient tolerated well. ?

## 2022-02-15 NOTE — Discharge Instructions (Addendum)
We recommend that you do not do any strenuous activities this weekend.  You may resume your regular activities on Monday.  This includes work. ? ?We will call you with the results of your biopsy as soon as these are available. ? ?For pain you may use extra strength Tylenol or Advil, over-the-counter. ? ?You will be scheduled for a pulmonary function test (breathing test). ? ?Continue taking your Breztri 2 puffs twice a day and as needed albuterol. ? ?You can get nicotine patches over-the-counter, recommend the 21 mg size, Place 1 patch each morning and take the patch off at bedtime. ? ?May take the bandage from your chest tube off in the morning and just use a large nonstick Band-Aid over the area to keep it protected, clean and dry. ? ?Keep your scheduled follow-up appointments.  But as we discussed previously, we will be in communication with you with regards of your results and next steps. ?

## 2022-02-15 NOTE — Discharge Summary (Signed)
Physician Discharge Summary  ?Patient ID: ?Paul Fields ?MRN: [id number] ?DOB/AGE: 63-Jan-1962 63 y.o. ? ?Admit date: 02/14/2022 ?Discharge date: 02/15/2022 ? ?Admission Diagnoses: ? ?Discharge Diagnoses:  ?Principal Problem: ?  Pneumothorax after biopsy ?Active Problems: ?  Nodule of upper lobe of left lung ?  Centrilobular emphysema ([place]) ?  Nicotine dependence, cigarettes, uncomplicated ? ? ?Discharged Condition: good ? ?Hospital Course: This is a 61 yo male current everyday smoker (1 PPD), COPD, and left upper lobe lung nodule.  He has had bilateral lung nodules that have been followed expectantly.  Initially he had been following with Cardiothoracic Surgeon Dr. [name] for this issue.  He had a chest CT scheduled in November 2022 which showed a new LEFT upper lobe nodule that was 7 mm and recommendation for 3 to 53-monthfollow-up was made.  We contacted the patient and he was scheduled for CT.  He missed follow-up appointments.  His follow-up CT was not done till 10 March. This showed that the LEFT upper lobe nodule had now grown to 1.2 cm x 0.5 cm.  This was followed with a PET/CT on 28 March which shows that this is hypermetabolic and consistent with malignancy.  [facility] of the left upper lobe lung nodule due to concern of possible malignancy.   ?  ?On 04/20 pt underwent robotic bronchoscopy of the left upper lung nodule.  Post procedure pt developed left-sided pneumothorax requiring left chest tube placement.  He was subsequently admitted to the stepdown unit postprocedure for monitoring and treatment.  He was placed on nebulization treatments for management of his COPD and close observation/management of his chest tube. ? ?Overnight the patient did well.  Pneumothorax resolved without incident.  Chest tube was removed and the 2-hour post removal film showed no pneumothorax.  Patient has been stable and eager to go home.    ? ?Prior to discharge she was counseled regards to discontinuation of smoking.  It was recommended that he get nicotine patches over-the-counter at 21 mg size and follow directions in the package.  He is encouraged take the patch off at bedtime and replace with a new patch in the morning.  He is being discharged home in good condition. ? ?Consults: None ? ?Significant Diagnostic Studies: radiology: CXR: Post bronchoscopy showed 25 to 30% pneumothorax, this resolved after insertion of chest tube.  At discharge no pneumothorax evident. ?and bronchoscopy: Robotic assisted bronchoscopy performed 20 April, results pending. ? ?Treatments: Chest tube placement 4/20, removal 4/21 ? ?Discharge Exam: ?Blood pressure 112/64, pulse 80, temperature 98.2 ?F (36.8 ?C), temperature source Oral, resp. rate 12, height '5\' 10"'$  (1.778 m), weight 69.9 kg, SpO2 94 %. ?General: Well appearing male, NAD resting in bed ?HENT: Supple, no JVD  ?Lungs: Diminished throughout, even, non labored left mid clavicular line chest tube to Heimlich valve ?Cardiovascular: NSR, rrr, no M/R/G, 2+ radial/1+ distal pulses, no edema  ?Abdomen: +BS x4, soft, non tender, non distended ?Extremities: Normal bulk and tone ?Neuro: Alert and oriented, follows commands, PERRLA  ?GU: Deferred  ? ?Disposition:  ? ?Allergies as of 02/15/2022   ?No Known Allergies ?  ? ?  ?Medication List  ?  ? ?TAKE these medications   ? ?albuterol 108 (90 Base) MCG/ACT inhaler ?Commonly known as: VENTOLIN HFA ?Inhale 2 puffs into the lungs every 6 (six) hours as needed for wheezing or shortness of breath. ?  ?Breztri Aerosphere 160-9-4.8 MCG/ACT Aero ?Generic drug:  Budeson-Glycopyrrol-Formoterol ?Inhale 2 puffs into the lungs in the morning and at bedtime. ?  ? ?  ? ? ? ?Signed: ?Renold Don, MD ?Advanced Bronchoscopy ?PCCM [facility] ? ? ? ?*This note was dictated using voice recognition software/Dragon.  Despite best efforts to proofread, errors can occur  which can change the meaning. Any transcriptional errors that result from this process are unintentional and may not be fully corrected at the time of dictation. ? ?02/15/2022, 10:59 AM ? ? ?

## 2022-02-15 NOTE — Anesthesia Postprocedure Evaluation (Signed)
Anesthesia Post Note ? ?Patient: Paul Fields ? ?Procedure(s) Performed: ROBOTIC ASSISTED NAVIGATIONAL BRONCHOSCOPY ? ?Patient location during evaluation: SICU ?Anesthesia Type: General ?Level of consciousness: awake and alert, oriented and patient cooperative ?Pain management: pain level controlled ?Vital Signs Assessment: post-procedure vital signs reviewed and stable ?Respiratory status: spontaneous breathing and respiratory function stable ?Cardiovascular status: stable ?Postop Assessment: no apparent nausea or vomiting ?Anesthetic complications: no ? ? ?No notable events documented. ? ? ?Last Vitals:  ?Vitals:  ? 02/15/22 0459 02/15/22 0500  ?BP: 124/68 124/68  ?Pulse: 85 90  ?Resp: 17 16  ?Temp:    ?SpO2: 96% 97%  ?  ?Last Pain:  ?Vitals:  ? 02/15/22 0459  ?TempSrc:   ?PainSc: Asleep  ? ? ?  ?  ?  ?  ?  ?  ? ?Lia Foyer ? ? ? ? ?

## 2022-02-15 NOTE — Progress Notes (Signed)
? ?NAME:  Paul Fields, MRN:  [id number], DOB:  27-Feb-1961, LOS: 1 ?ADMISSION DATE:  02/14/2022, CONSULTATION DATE: 02/14/2022 ?REFERRING MD: Dr. Galen Daft, CHIEF COMPLAINT: Lung Mass   ? ?History of Present Illness:  ?This is a 61 yo male with a PMH of current everyday smoker (1 PPD), COPD, and left upper lobe lung nodule.  He has had bilateral lung nodules that have been followed expectantly.  Initially he had been following with Cardiothoracic Surgeon Dr. [name] for this issue.  He had a chest CT scheduled in November 2022 which showed a new LEFT upper lobe nodule that was 7 mm and recommendation for 3 to 38-monthfollow-up was made.  We contacted the patient and he was scheduled for CT.  He missed follow-up appointments.  His follow-up CT was not done till 10 March.  This showed that the LEFT upper lobe nodule had now grown to 1.2 cm x 0.5 cm.  This was followed with a PET/CT on 28 March which shows that this is hypermetabolic and consistent with malignancy.  [facility] left upper lobe lung nodule due to concern of possible malignancy.   ? ?On 04/20 pt underwent robotic bronchoscopy of the left upper lung nodule.  Post procedure pt developed left-sided pneumothorax requiring left chest tube placement.  He was subsequently admitted to the stepdown unit postprocedure for monitoring and treatment.   ? ?Pertinent  Medical History  ?Arthritis ?Diverticulosis ?Centrilobular Emphysema  ?Recurrent Major Depressive Disorder ?GERD ?Adenomatous Colonic Polyps ?Current Everyday Smoker  ? ?Significant Hospital Events: ?Including procedures, antibiotic start and stop dates in addition to other pertinent events   ?04/20: Pt admitted to the stepdown unit s/p robotic bronchoscopy of the left upper lung nodule complicated by left-sided pneumothorax post procedure requiring left chest tube placement  ?04/21: Pneumothorax resolved, no issues  overnight.  Chest tube removed ? ?Interim History / Subjective:  ?Pt c/o minimal left-sided chest pain at chest tube site, no other complaint. ? ?Objective   ?Blood pressure 121/74, pulse 89, temperature 98.2 ?F (36.8 ?C), temperature source Oral, resp. rate (!) 25, height '5\' 10"'$  (1.778 m), weight 69.9 kg, SpO2 97 %. ?   ?   ? ?Intake/Output Summary (Last 24 hours) at 02/15/2022 0840 ?Last data filed at 02/14/2022 1900 ?Gross per 24 hour  ?Intake 1400 ml  ?Output 500 ml  ?Net 900 ml  ? ? ?Filed Weights  ? 02/14/22 1116 02/14/22 1625  ?Weight: 70.3 kg 69.9 kg  ? ? ?Examination: ?General: Well appearing male, NAD resting in bed ?HENT: Supple, no JVD  ?Lungs: Diminished throughout, even, non labored left mid clavicular line chest tube to Heimlich valve ?Cardiovascular: NSR, rrr, no M/R/G, 2+ radial/1+ distal pulses, no edema  ?Abdomen: +BS x4, soft, non tender, non distended ?Extremities: Normal bulk and tone ?Neuro: Alert and oriented, follows commands, PERRLA  ?GU: Deferred  ? ?Resolved Hospital Problem list   ?Left pneumothorax -resolved ? ?Assessment & Plan:  ?Left upper lobe lung nodule s/p robotic bronchoscopy complicated by left-sided pneumothorax requiring left chest tube placement ?Hx: Centrilobular emphysema and Current everyday smoker  ?- Weaned off O2 ?- Scheduled and prn bronchodilator therapy ?- Nicotine patch ?- Smoking cessation counseling provided counseling time 5-8 minutes ?- Chest tube removed ? ?Postop pain  ?- Prn tylenol and percocet for pain management  ? ?Best Practice (right click and "Reselect all SmartList Selections" daily)  ? ?Diet/type: Regular consistency (  see orders) ?DVT prophylaxis: SCD ?GI prophylaxis: N/A ?Lines: N/A ?Foley:  N/A ?Code Status:  full code ?Last date of multidisciplinary goals of care discussion [N/A] ? ?Labs   ?CBC: ?Recent Labs  ?Lab 02/12/22 ?0903 02/14/22 ?1717 02/15/22 ?0510  ?WBC 5.2 14.1* 10.6*  ?NEUTROABS  --  13.3*  --   ?HGB 16.3 15.5 15.0  ?HCT 48.4 46.4 44.8   ?MCV 95.8 96.5 96.6  ?PLT 241 239 233  ? ? ? ?Basic Metabolic Panel: ?Recent Labs  ?Lab 02/14/22 ?1717 02/15/22 ?0510  ?NA 139 138  ?K 4.5 4.4  ?CL 105 103  ?CO2 29 27  ?GLUCOSE 119* 161*  ?BUN 20 20  ?CREATININE 1.05 0.97  ?CALCIUM 8.9 8.8*  ?MG 2.0 2.2  ?PHOS 3.0 3.9  ? ?GFR: ?Estimated Creatinine Clearance: 78.1 mL/min (by C-G formula based on SCr of 0.97 mg/dL). ?Recent Labs  ?Lab 02/12/22 ?0903 02/14/22 ?1717 02/15/22 ?0510  ?WBC 5.2 14.1* 10.6*  ? ? ? ?Liver Function Tests: ?No results for input(s): AST, ALT, ALKPHOS, BILITOT, PROT, ALBUMIN in the last 168 hours. ?No results for input(s): LIPASE, AMYLASE in the last 168 hours. ?No results for input(s): AMMONIA in the last 168 hours. ? ?ABG ?No results found for: PHART, PCO2ART, PO2ART, HCO3, TCO2, ACIDBASEDEF, O2SAT  ? ?Coagulation Profile: ?No results for input(s): INR, PROTIME in the last 168 hours. ? ?Cardiac Enzymes: ?No results for input(s): CKTOTAL, CKMB, CKMBINDEX, TROPONINI in the last 168 hours. ? ?HbA1C: ?Hgb A1c MFr Bld  ?Date/Time Value Ref Range Status  ?12/14/2019 11:46 AM 6.3 4.6 - 6.5 % Final  ?  Comment:  ?  Glycemic Control Guidelines for People with Diabetes:Non Diabetic:  <6%Goal of Therapy: <7%Additional Action Suggested:  >8%   ? ? ?CBG: ?Recent Labs  ?Lab 02/14/22 ?1629  ?GLUCAP 120*  ? ? ?Review of Systems: Positives in BOLD   ?Gen: Denies fever, chills, weight change, fatigue, night sweats ?HEENT: Denies blurred vision, double vision, hearing loss, tinnitus, sinus congestion, rhinorrhea, sore throat, neck stiffness, dysphagia ?PULM: Denies shortness of breath, cough, sputum production, hemoptysis, wheezing ?CV: left-sided chest pain at chest tube site, edema, orthopnea, paroxysmal nocturnal dyspnea, palpitations ?GI: Denies abdominal pain, nausea, vomiting, diarrhea, hematochezia, melena, constipation, change in bowel habits ?GU: Denies dysuria, hematuria, polyuria, oliguria, urethral discharge ?Endocrine: Denies hot or cold  intolerance, polyuria, polyphagia or appetite change ?Derm: Denies rash, dry skin, scaling or peeling skin change ?Heme: Denies easy bruising, bleeding, bleeding gums ?Neuro: Denies headache, numbness, weakness, slurred speech, loss of memory or consciousness ? ?Allergies ?No Known Allergies  ? ?Home Medications  ?Prior to Admission medications   ?Medication Sig Start Date End Date Taking? Authorizing Provider  ?albuterol (VENTOLIN HFA) 108 (90 Base) MCG/ACT inhaler Inhale 2 puffs into the lungs every 6 (six) hours as needed for wheezing or shortness of breath. 02/11/22  Yes Flora Lipps, MD  ?Budeson-Glycopyrrol-Formoterol (BREZTRI AEROSPHERE) 160-9-4.8 MCG/ACT AERO Inhale 2 puffs into the lungs in the morning and at bedtime. 02/11/22  Yes Flora Lipps, MD  ?Budeson-Glycopyrrol-Formoterol (BREZTRI AEROSPHERE) 160-9-4.8 MCG/ACT AERO Inhale 2 puffs into the lungs in the morning and at bedtime. 02/11/22   Tyler Pita, MD  ?  ?Scheduled Meds: ? budesonide (PULMICORT) nebulizer solution  0.5 mg Nebulization BID  ? Chlorhexidine Gluconate Cloth  6 each Topical Q0600  ? ipratropium-albuterol  3 mL Nebulization Q4H  ? nicotine  21 mg Transdermal Daily  ? ?Continuous Infusions: ?PRN Meds:.acetaminophen, oxyCODONE-acetaminophen ? ?Level 3 follow-up ?  ?Present at  bedside, discussed with patient.  Will check post chest tube removal film in 2 hours.  If no recurrent pneumothorax will discharge home.  Awaiting pathology reports.  Smoking cessation counseling given. ? ?Patient will be scheduled for PFTs as an outpatient.  Anticipate thoracic surgery evaluation as an outpatient. ? ?Discussed details of the procedure with the patient and wife and all questions were answered to her satisfaction. ? ? ?C. Derrill Kay, MD ?Advanced Bronchoscopy ?[facility]- ? ? ? ?*This note was dictated using voice recognition software/Dragon.  Despite best efforts to proofread, errors can occur which can change the  meaning. Any transcriptional errors that result from this process are unintentional and may not be fully corrected at the time of dictation. ? ? ? ?

## 2022-02-15 NOTE — Plan of Care (Signed)
Discharge teaching completed with patient who is in stable condition. 

## 2022-02-18 ENCOUNTER — Telehealth: Payer: Self-pay | Admitting: Pulmonary Disease

## 2022-02-19 ENCOUNTER — Other Ambulatory Visit: Payer: Self-pay

## 2022-02-19 DIAGNOSIS — J449 Chronic obstructive pulmonary disease, unspecified: Secondary | ICD-10-CM

## 2022-02-19 NOTE — Telephone Encounter (Signed)
Paul Fields with Respiratory has spoke with the patient and his PFT has been rescheduled for 02/22/22 @ 10:00am at [facility]  ?

## 2022-02-22 ENCOUNTER — Ambulatory Visit (HOSPITAL_COMMUNITY)
Admission: RE | Admit: 2022-02-22 | Discharge: 2022-02-22 | Disposition: A | Discharge status: Home / Self Care | Payer: BC Managed Care – PPO | Source: Ambulatory Visit | Attending: Pulmonary Disease | Admitting: Pulmonary Disease

## 2022-02-22 DIAGNOSIS — J449 Chronic obstructive pulmonary disease, unspecified: Secondary | ICD-10-CM | POA: Insufficient documentation

## 2022-02-22 LAB — PULMONARY FUNCTION TEST
DL/VA % pred: 78 %
DL/VA: 3.33 ml/min/mmHg/L
DLCO cor % pred: 94 %
DLCO cor: 24.25 ml/min/mmHg
DLCO unc % pred: 95 %
DLCO unc: 24.52 ml/min/mmHg
FEF 25-75 Post: 1.39 L/sec
FEF 25-75 Pre: 0.81 L/sec
FEF2575-%Change-Post: 72 %
FEF2575-%Pred-Post: 51 %
FEF2575-%Pred-Pre: 30 %
FEV1-%Change-Post: 7 %
FEV1-%Pred-Post: 74 %
FEV1-%Pred-Pre: 69 %
FEV1-Post: 2.45 L
FEV1-Pre: 2.28 L
FEV1FVC-%Change-Post: 2 %
FEV1FVC-%Pred-Pre: 76 %
FEV6-%Change-Post: 9 %
FEV6-%Pred-Post: 93 %
FEV6-%Pred-Pre: 85 %
FEV6-Post: 3.9 L
FEV6-Pre: 3.56 L
FEV6FVC-%Change-Post: 4 %
FEV6FVC-%Pred-Post: 98 %
FEV6FVC-%Pred-Pre: 94 %
FVC-%Change-Post: 5 %
FVC-%Pred-Post: 95 %
FVC-%Pred-Pre: 90 %
FVC-Post: 4.17 L
FVC-Pre: 3.96 L
Post FEV1/FVC ratio: 59 %
Post FEV6/FVC ratio: 94 %
Pre FEV1/FVC ratio: 58 %
Pre FEV6/FVC Ratio: 90 %
RV % pred: 231 %
RV: 5.04 L
TLC % pred: 139 %
TLC: 9.19 L

## 2022-02-22 MED ORDER — ALBUTEROL SULFATE (2.5 MG/3ML) 0.083% IN NEBU
2.5000 mg | INHALATION_SOLUTION | Freq: Once | RESPIRATORY_TRACT | Status: AC
  Administered 2022-02-22: 2.5 mg via RESPIRATORY_TRACT

## 2022-02-24 ENCOUNTER — Encounter: Payer: Self-pay | Admitting: Pulmonary Disease

## 2022-02-25 NOTE — Telephone Encounter (Signed)
Spoke to patient via telephone. Received verbal to speak to sister, Margaretha Sheffield.  ? ? ? ?Dr. Patsey Berthold, please advise. Thanks ?

## 2022-03-04 ENCOUNTER — Ambulatory Visit: Payer: BC Managed Care – PPO | Admitting: Thoracic Surgery (Cardiothoracic Vascular Surgery)

## 2022-03-04 ENCOUNTER — Other Ambulatory Visit: Payer: Self-pay | Admitting: *Deleted

## 2022-03-04 ENCOUNTER — Encounter: Payer: Self-pay | Admitting: *Deleted

## 2022-03-04 ENCOUNTER — Encounter: Payer: Self-pay | Admitting: Thoracic Surgery (Cardiothoracic Vascular Surgery)

## 2022-03-04 VITALS — BP 133/82 | HR 86 | Resp 20 | Ht 70.0 in | Wt 158.8 lb

## 2022-03-04 DIAGNOSIS — R911 Solitary pulmonary nodule: Secondary | ICD-10-CM

## 2022-03-04 NOTE — Progress Notes (Signed)
PCP is [name], MD ?Referring Provider is Tyler Pita, MD ? ?Chief Complaint  ?Patient presents with  ? Lung Lesion  ?  New patient consultation Chest CT 3/10, PET 3/28, Super D 4/19, NAV Bronch 4/20, PFTs ordered  ? ? ?HPI: Paul Fields is sent for consultation regarding a left upper lobe lung nodule. ? ?Paul Fields is a 61 year old gentleman with a history of heavy tobacco abuse, emphysema, arthritis, and lung nodules.  He had been followed for lung nodules for several years.  In November 2022 there was a new left upper lobe nodule.  On follow-up the nodule had increased in size.  On PET/CT it was hypermetabolic with an SUV of 4.  A robotic bronchoscopy was performed.  It showed some atypical cells but was not diagnostic.  He did have a pneumothorax requiring tube placement post procedure. ? 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  Is not having any chest pain, pressure, or tightness.  No change in appetite or weight loss.  Does have a cough and occasional wheezing mostly at night.  Short of breath only with very heavy exertion.  No headaches or visual changes. ? ?He was smoking about 2 packs/day prior to having his bronchoscopy.  Since then he has smoked about 2 packs of cigarettes in 2 weeks.  He is using patches which helps with the cravings. ?Zubrod Score: ?At the time of surgery this patient?s most appropriate activity status/level should be described as: ?'[x]'$     0    Normal activity, no symptoms ?'[]'$     1    Restricted in physical strenuous activity but ambulatory, able to do out light work ?'[]'$     2    Ambulatory and capable of self care, unable to do work activities, up and about >50 % of waking hours                              ?'[]'$     3    Only limited self care, in bed greater than 50% of waking hours ?'[]'$     4    Completely disabled, no self care, confined to bed or chair ?'[]'$     5    Moribund ? ? ?Past Medical History:  ?Diagnosis Date  ? Arthritis   ? Cancer [facility])   ? lung  ?  Clavicle fracture 1986  ? COPD (chronic obstructive pulmonary disease) ([place])   ? Diverticulosis   ? Elbow fracture, right 1968  ? Emphysema lung ([place])   ? Hemorrhoid   ? Major depressive disorder, recurrent episode ([place]) 03/07/2016  ? when mom passed   ? Polyp of colon, adenomatous   ? possible based on genetic testing  ? ? ?Past Surgical History:  ?Procedure Laterality Date  ? COLONOSCOPY    ? COLONOSCOPY W/ BIOPSIES  04/2015  ? polyps-benign  ? ELBOW FRACTURE SURGERY Right 1967  ? Pins, then hardware removal  ? ELBOW HARDWARE REMOVAL Right 1969  ? right  ? POLYPECTOMY    ? polyps  2013  ? 20 polyps biopsies as tubular adenomas  ? right toe surgery  2018  ? [employer] EXTRACTION  1983  ? ? ?Family History  ?Problem Relation Age of Onset  ? COPD Father   ? Arthritis Father   ? Parkinsonism Mother   ? Diabetes Mother   ?     borderline  ? Lung cancer Mother   ?  Cancer Mother   ?     Lung  ? Colon cancer Maternal Uncle   ?     diagnosed in his 86s  ? Alzheimer's disease Paternal Aunt   ? COPD Paternal Uncle   ? Lung cancer Maternal Grandfather   ?     coal miner  ? Prostate cancer Maternal Uncle   ?     diagnosed in his 86s  ? Alzheimer's disease Paternal Aunt   ? Heart attack Paternal Uncle   ? Brain cancer Paternal Uncle 93  ? Lymphoma Cousin   ?     paternal cousin  ? COPD Brother   ?     prostate  ? Cancer Paternal Grandfather   ?     lung  ? Heart disease Neg Hx   ? Stomach cancer Neg Hx   ? Rectal cancer Neg Hx   ? Colon polyps Neg Hx   ? Esophageal cancer Neg Hx   ? ? ?Social History ?Social History  ? ?Tobacco Use  ? Smoking status: Every Day  ?  Packs/day: 3.00  ?  Years: 45.00  ?  Pack years: 135.00  ?  Types: Cigarettes  ? Smokeless tobacco: Never  ? Tobacco comments:  ?  1PPD 01/31/2022  ?Vaping Use  ? Vaping Use: Never used  ?Substance Use Topics  ? Alcohol use: No  ?  Comment: History of 6 pk beer on weekends- Last [facility] 11/2015  ? Drug use: No  ? ? ?Current Outpatient Medications  ?Medication Sig Dispense  Refill  ? albuterol (VENTOLIN HFA) 108 (90 Base) MCG/ACT inhaler Inhale 2 puffs into the lungs every 6 (six) hours as needed for wheezing or shortness of breath. 8 g 0  ? Budeson-Glycopyrrol-Formoterol (BREZTRI AEROSPHERE) 160-9-4.8 MCG/ACT AERO Inhale 2 puffs into the lungs in the morning and at bedtime. 5.9 g 0  ? ?No current facility-administered medications for this visit.  ? ? ?No Known Allergies ? ?Review of Systems  ?Constitutional:  Positive for fatigue. Negative for activity change and unexpected weight change.  ?HENT:  Negative for trouble swallowing and voice change.   ?Respiratory:  Positive for cough, shortness of breath (heavy exertion) and wheezing.   ?Cardiovascular:  Negative for chest pain and leg swelling.  ?Gastrointestinal:  Negative for abdominal distention and abdominal pain.  ?Genitourinary:  Negative for difficulty urinating and dysuria.  ?Musculoskeletal:  Negative for arthralgias and joint swelling.  ?Neurological:  Negative for seizures and weakness.  ?Hematological:  Negative for adenopathy. Does not bruise/bleed easily.  ? ?BP 133/82 (BP Location: Right Arm, Patient Position: Sitting, Cuff Size: Normal)   Pulse 86   Resp 20   Ht '5\' 10"'$  (1.778 m)   Wt 158 lb 12.8 oz (72 kg)   SpO2 95% Comment: RA  BMI 22.79 kg/m?  ?Physical Exam ?Vitals reviewed.  ?Constitutional:   ?   General: He is not in acute distress. ?HENT:  ?   Head: Normocephalic and atraumatic.  ?Cardiovascular:  ?   Rate and Rhythm: Normal rate and regular rhythm.  ?   Heart sounds: Normal heart sounds. No murmur heard. ?  No friction rub. No gallop.  ?Pulmonary:  ?   Effort: Pulmonary effort is normal. No respiratory distress.  ?   Breath sounds: No wheezing or rales.  ?Abdominal:  ?   General: There is no distension.  ?   Palpations: Abdomen is soft.  ?Musculoskeletal:     ?   General: Deformity (clubbing) present.  ?  Cervical back: Neck supple.  ?Lymphadenopathy:  ?   Cervical: No cervical adenopathy.  ?Skin: ?    General: Skin is warm and dry.  ?Neurological:  ?   General: No focal deficit present.  ?   Mental Status: He is alert and oriented to person, place, and time.  ?   Cranial Nerves: No cranial nerve deficit.  ?   Motor: No weakness.  ? ? ?Diagnostic Tests: ?CT CHEST WITHOUT CONTRAST ?  ?TECHNIQUE: ?Multidetector CT imaging of the chest was performed following the ?standard protocol without IV contrast. ?  ?RADIATION DOSE REDUCTION: This exam was performed according to the ?departmental dose-optimization program which includes automated ?exposure control, adjustment of the mA and/or kV according to ?patient size and/or use of iterative reconstruction technique. ?  ?COMPARISON:  Chest CT 08/31/2021. ?  ?FINDINGS: ?Cardiovascular: Heart size is normal. There is no significant ?pericardial fluid, thickening or pericardial calcification. No ?atherosclerotic calcifications are noted in the thoracic aorta or ?the coronary arteries. ?  ?Mediastinum/Nodes: No pathologically enlarged mediastinal or hilar ?lymph nodes. Please note that accurate exclusion of hilar adenopathy ?is limited on noncontrast CT scans. Several small densely calcified ?mediastinal and bilateral hilar lymph nodes are incidentally noted. ?Esophagus is unremarkable in appearance. No axillary ?lymphadenopathy. ?  ?Lungs/Pleura: Previously noted left upper lobe pulmonary nodule has ?increased in size compared to the prior study, currently measuring ?1.2 x 1.1 cm (axial image 40 of series 3) as compared with 7 mm on ?the prior examination. Previously noted ill-defined right upper lobe ?pulmonary nodule currently measures 9 x 6 mm (axial image 61 of ?series 3) as compared with 8 x 6 mm on the prior examination ?(essentially stable). Several tiny calcified granulomas are again ?noted scattered throughout the lungs bilaterally. No acute ?consolidative airspace disease. No pleural effusions. Diffuse ?bronchial wall thickening with mild centrilobular and  paraseptal ?emphysema. ?  ?Upper Abdomen: Unremarkable. ?  ?Musculoskeletal: Old healed fracture of the right distal clavicle ?incidentally noted. There are no aggressive appearing lytic or ?blastic lesions noted in the

## 2022-03-04 NOTE — H&P (View-Only) (Signed)
PCP is Paul Haven, MD Referring Provider is Paul Pita, MD  Chief Complaint  Patient presents with   Lung Lesion    New patient consultation Chest CT 3/10, PET 3/28, Super D 4/19, NAV Bronch 4/20, PFTs ordered    HPI: [name] is sent for consultation regarding a left upper lobe lung nodule.  Paul Fields is a 61 year old gentleman with a history of heavy tobacco abuse, emphysema, arthritis, and lung nodules.  He had been followed for lung nodules for several years.  In November 2022 there was a new left upper lobe nodule.  On follow-up the nodule had increased in size.  On PET/CT it was hypermetabolic with an SUV of 4.  A robotic bronchoscopy was performed.  It showed some atypical cells but was not diagnostic.  He did have a pneumothorax requiring tube placement post procedure.  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  Is not having any chest pain, pressure, or tightness.  No change in appetite or weight loss.  Does have a cough and occasional wheezing mostly at night.  Short of breath only with very heavy exertion.  No headaches or visual changes.  He was smoking about 2 packs/day prior to having his bronchoscopy.  Since then he has smoked about 2 packs of cigarettes in 2 weeks.  He is using patches which helps with the cravings. Zubrod Score: At the time of surgery this patient's most appropriate activity status/level should be described as: '[x]'$     0    Normal activity, no symptoms '[]'$     1    Restricted in physical strenuous activity but ambulatory, able to do out light work '[]'$     2    Ambulatory and capable of self care, unable to do work activities, up and about >50 % of waking hours                              '[]'$     3    Only limited self care, in bed greater than 50% of waking hours '[]'$     4    Completely disabled, no self care, confined to bed or chair '[]'$     5    Moribund   Past Medical History:  Diagnosis Date   Arthritis    Cancer ([place])    lung    Clavicle fracture 1986   COPD (chronic obstructive pulmonary disease) ([place])    Diverticulosis    Elbow fracture, right 1968   Emphysema lung ([place])    Hemorrhoid    Major depressive disorder, recurrent episode ([place]) 03/07/2016   when mom passed    Polyp of colon, adenomatous    possible based on genetic testing    Past Surgical History:  Procedure Laterality Date   COLONOSCOPY     COLONOSCOPY W/ BIOPSIES  04/2015   polyps-benign   ELBOW FRACTURE SURGERY Right 1967   Pins, then hardware removal   ELBOW HARDWARE REMOVAL Right 1969   right   POLYPECTOMY     polyps  2013   20 polyps biopsies as tubular adenomas   right toe surgery  2018   WISDOM TOOTH EXTRACTION  1983    Family History  Problem Relation Age of Onset   COPD Father    Arthritis Father    Parkinsonism Mother    Diabetes Mother        borderline   Lung cancer Mother  Cancer Mother        Lung   Colon cancer Maternal Uncle        diagnosed in his 56s   Alzheimer's disease Paternal Aunt    COPD Paternal Uncle    Lung cancer Maternal Grandfather        coal miner   Prostate cancer Maternal Uncle        diagnosed in his 28s   Alzheimer's disease Paternal Aunt    Heart attack Paternal Uncle    Brain cancer Paternal Uncle 54   Lymphoma Cousin        paternal cousin   COPD Brother        prostate   Cancer Paternal Grandfather        lung   Heart disease Neg Hx    Stomach cancer Neg Hx    Rectal cancer Neg Hx    Colon polyps Neg Hx    Esophageal cancer Neg Hx     Social History Social History   Tobacco Use   Smoking status: Every Day    Packs/day: 3.00    Years: 45.00    Pack years: 135.00    Types: Cigarettes   Smokeless tobacco: Never   Tobacco comments:    1PPD 01/31/2022  Vaping Use   Vaping Use: Never used  Substance Use Topics   Alcohol use: No    Comment: History of 6 pk beer on weekends- Last [facility] 11/2015   Drug use: No    Current Outpatient Medications  Medication Sig Dispense  Refill   albuterol (VENTOLIN HFA) 108 (90 Base) MCG/ACT inhaler Inhale 2 puffs into the lungs every 6 (six) hours as needed for wheezing or shortness of breath. 8 g 0   Budeson-Glycopyrrol-Formoterol (BREZTRI AEROSPHERE) 160-9-4.8 MCG/ACT AERO Inhale 2 puffs into the lungs in the morning and at bedtime. 5.9 g 0   No current facility-administered medications for this visit.    No Known Allergies  Review of Systems  Constitutional:  Positive for fatigue. Negative for activity change and unexpected weight change.  HENT:  Negative for trouble swallowing and voice change.   Respiratory:  Positive for cough, shortness of breath (heavy exertion) and wheezing.   Cardiovascular:  Negative for chest pain and leg swelling.  Gastrointestinal:  Negative for abdominal distention and abdominal pain.  Genitourinary:  Negative for difficulty urinating and dysuria.  Musculoskeletal:  Negative for arthralgias and joint swelling.  Neurological:  Negative for seizures and weakness.  Hematological:  Negative for adenopathy. Does not bruise/bleed easily.   BP 133/82 (BP Location: Right Arm, Patient Position: Sitting, Cuff Size: Normal)   Pulse 86   Resp 20   Ht '5\' 10"'$  (1.778 m)   Wt 158 lb 12.8 oz (72 kg)   SpO2 95% Comment: RA  BMI 22.79 kg/m  Physical Exam Vitals reviewed.  Constitutional:      General: He is not in acute distress. HENT:     Head: Normocephalic and atraumatic.  Cardiovascular:     Rate and Rhythm: Normal rate and regular rhythm.     Heart sounds: Normal heart sounds. No murmur heard.   No friction rub. No gallop.  Pulmonary:     Effort: Pulmonary effort is normal. No respiratory distress.     Breath sounds: No wheezing or rales.  Abdominal:     General: There is no distension.     Palpations: Abdomen is soft.  Musculoskeletal:        General: Deformity (clubbing) present.  Cervical back: Neck supple.  Lymphadenopathy:     Cervical: No cervical adenopathy.  Skin:     General: Skin is warm and dry.  Neurological:     General: No focal deficit present.     Mental Status: He is alert and oriented to person, place, and time.     Cranial Nerves: No cranial nerve deficit.     Motor: No weakness.    Diagnostic Tests: CT CHEST WITHOUT CONTRAST   TECHNIQUE: Multidetector CT imaging of the chest was performed following the standard protocol without IV contrast.   RADIATION DOSE REDUCTION: This exam was performed according to the departmental dose-optimization program which includes automated exposure control, adjustment of the mA and/or kV according to patient size and/or use of iterative reconstruction technique.   COMPARISON:  Chest CT 08/31/2021.   FINDINGS: Cardiovascular: Heart size is normal. There is no significant pericardial fluid, thickening or pericardial calcification. No atherosclerotic calcifications are noted in the thoracic aorta or the coronary arteries.   Mediastinum/Nodes: No pathologically enlarged mediastinal or hilar lymph nodes. Please note that accurate exclusion of hilar adenopathy is limited on noncontrast CT scans. Several small densely calcified mediastinal and bilateral hilar lymph nodes are incidentally noted. Esophagus is unremarkable in appearance. No axillary lymphadenopathy.   Lungs/Pleura: Previously noted left upper lobe pulmonary nodule has increased in size compared to the prior study, currently measuring 1.2 x 1.1 cm (axial image 40 of series 3) as compared with 7 mm on the prior examination. Previously noted ill-defined right upper lobe pulmonary nodule currently measures 9 x 6 mm (axial image 61 of series 3) as compared with 8 x 6 mm on the prior examination (essentially stable). Several tiny calcified granulomas are again noted scattered throughout the lungs bilaterally. No acute consolidative airspace disease. No pleural effusions. Diffuse bronchial wall thickening with mild centrilobular and  paraseptal emphysema.   Upper Abdomen: Unremarkable.   Musculoskeletal: Old healed fracture of the right distal clavicle incidentally noted. There are no aggressive appearing lytic or blastic lesions noted in the visualized portions of the skeleton.   IMPRESSION: 1. Interval enlargement of previously noted left upper lobe pulmonary nodule, which currently measures 1.2 x 0.5 cm. Follow-up nonemergent PET-CT is suggested in the near future to further evaluate this lesion. 2. Old granulomatous disease, as above.     Electronically Signed   By: Vinnie Langton M.D.   On: 01/06/2022 06:56 NUCLEAR MEDICINE PET SKULL BASE TO THIGH   TECHNIQUE: 7.9 mCi F-18 FDG was injected intravenously. Full-ring PET imaging was performed from the skull base to thigh after the radiotracer. CT data was obtained and used for attenuation correction and anatomic localization.   Fasting blood glucose: 102 mg/dl   COMPARISON:  Chest CT 01/04/2022   FINDINGS: Mediastinal blood pool activity: SUV max 2.0   Liver activity: SUV max NA   NECK: No hypermetabolic lymph nodes in the neck.   Incidental CT findings: none   CHEST: 12 mm nodule the LEFT upper lobe along fissure has intense metabolic activity for size with SUV max equal 4.0 (image 58).   No additional hypermetabolic nodules.   Scattered small calcified and noncalcified nodules throughout the lungs consistent granulomatous disease.   No hypermetabolic mediastinal nodes or enlarged mediastinal nodes several calcified mediastinal nodes are present   Incidental CT findings: none   ABDOMEN/PELVIS: No abnormal hypermetabolic activity within the liver, pancreas, adrenal glands, or spleen. No hypermetabolic lymph nodes in the abdomen or pelvis.   Incidental CT  findings: None   SKELETON: No focal hypermetabolic activity to suggest skeletal metastasis.   Incidental CT findings: none   IMPRESSION: 1. Hypermetabolic nodule in the LEFT  upper lobe is concerning for primary bronchogenic carcinoma. 2. No evidence of mediastinal nodal metastasis or distant metastatic disease. 3. Small scattered pulmonary nodules and calcified small mediastinal lymph nodes consistent prior granulomatous disease.     Electronically Signed   By: Suzy Bouchard M.D.   On: 01/23/2022 12:48 I personally reviewed the CT and PET/CT images.  There is an enlarging left upper lobe lung nodule posteriorly adjacent to the major fissure that is markedly hypermetabolic for its size on PET.  Pulmonary function testing 02/22/2022 FVC 3.96 (90%) FEV1 2.28 (69%) FEV1 2.45 (74%) postbronchodilator DLCO 24.25 (94%)  Impression: Paul Fields is a 61 year old gentleman with a history of heavy tobacco abuse, emphysema, arthritis, and lung nodules.   Left upper lobe lung nodule-new in November 2022.  Significant increase in size in 6 months.  No evidence of hilar or mediastinal adenopathy.  Suspicious for new primary bronchogenic carcinoma.  Given his age, smoking history, appearance of the nodule, interval growth, and PET findings this has to be considered a new primary bronchogenic carcinoma unless it can be proven otherwise.  I recommended to Mr. and Mrs. Strege that we proceed with robotic left VATS for wedge resection and possible lingular sparing left upper lobectomy depending on the results of intraoperative frozen section.  I described the general nature of the procedure including the need for general anesthesia, the incisions to be used, the use of a drainage tube postoperatively, the expected hospital stay, and the overall recovery.  I informed him of the indications, risks, benefits, and alternatives.  He understands the risks include, but not limited to death, MI, DVT, PE, bleeding, possible need for transfusion, infection, prolonged air leak, cardiac arrhythmias, as well as possibility of other unforeseeable complications.  He understands and  accepts the risks and wishes to proceed.  Tobacco abuse-heavy smoker for many years.  He has cut back dramatically since his bronchoscopy procedure.  I emphasized the importance of complete cessation and he is committed to try to do so.  Plan: Robotic left VATS for wedge resection and possible lingular sparing upper lobectomy on Thursday, 03/14/2022  [name], MD [facility] [phone]

## 2022-03-07 ENCOUNTER — Ambulatory Visit: Payer: BC Managed Care – PPO

## 2022-03-07 ENCOUNTER — Telehealth: Payer: Self-pay

## 2022-03-07 NOTE — Telephone Encounter (Signed)
STD form/Attending Physician's Statement completed and faxed to [facility]. @ [phone] ?Beginning LOA 03/14/22 through approx. 05/13/22. ?

## 2022-03-12 ENCOUNTER — Ambulatory Visit (HOSPITAL_COMMUNITY)
Admission: RE | Admit: 2022-03-12 | Discharge: 2022-03-12 | Disposition: A | Discharge status: Home / Self Care | Payer: BC Managed Care – PPO | Source: Ambulatory Visit | Attending: Thoracic Surgery (Cardiothoracic Vascular Surgery) | Admitting: Thoracic Surgery (Cardiothoracic Vascular Surgery)

## 2022-03-12 ENCOUNTER — Other Ambulatory Visit: Payer: Self-pay

## 2022-03-12 ENCOUNTER — Encounter (HOSPITAL_COMMUNITY): Payer: Self-pay

## 2022-03-12 ENCOUNTER — Encounter (HOSPITAL_COMMUNITY)
Admission: RE | Admit: 2022-03-12 | Discharge: 2022-03-12 | Disposition: A | Discharge status: Home / Self Care | Payer: BC Managed Care – PPO | Source: Ambulatory Visit | Attending: Thoracic Surgery (Cardiothoracic Vascular Surgery) | Admitting: Thoracic Surgery (Cardiothoracic Vascular Surgery)

## 2022-03-12 VITALS — BP 134/78 | HR 85 | Temp 98.4°F | Resp 18 | Ht 70.0 in | Wt 158.0 lb

## 2022-03-12 DIAGNOSIS — R911 Solitary pulmonary nodule: Secondary | ICD-10-CM | POA: Insufficient documentation

## 2022-03-12 DIAGNOSIS — Z01818 Encounter for other preprocedural examination: Secondary | ICD-10-CM | POA: Insufficient documentation

## 2022-03-12 DIAGNOSIS — Z20822 Contact with and (suspected) exposure to covid-19: Secondary | ICD-10-CM | POA: Insufficient documentation

## 2022-03-12 LAB — CBC
HCT: 45.4 % (ref 39.0–52.0)
Hemoglobin: 15.4 g/dL (ref 13.0–17.0)
MCH: 33.1 pg (ref 26.0–34.0)
MCHC: 33.9 g/dL (ref 30.0–36.0)
MCV: 97.6 fL (ref 80.0–100.0)
Platelets: 260 10*3/uL (ref 150–400)
RBC: 4.65 MIL/uL (ref 4.22–5.81)
RDW: 12.8 % (ref 11.5–15.5)
WBC: 7.5 10*3/uL (ref 4.0–10.5)
nRBC: 0 % (ref 0.0–0.2)

## 2022-03-12 LAB — URINALYSIS, ROUTINE W REFLEX MICROSCOPIC
Bilirubin Urine: NEGATIVE
Glucose, UA: NEGATIVE mg/dL
Hgb urine dipstick: NEGATIVE
Ketones, ur: NEGATIVE mg/dL
Leukocytes,Ua: NEGATIVE
Nitrite: NEGATIVE
Protein, ur: NEGATIVE mg/dL
Specific Gravity, Urine: 1.019 (ref 1.005–1.030)
pH: 6 (ref 5.0–8.0)

## 2022-03-12 LAB — BLOOD GAS, ARTERIAL
Acid-Base Excess: 2 mmol/L (ref 0.0–2.0)
Bicarbonate: 26.5 mmol/L (ref 20.0–28.0)
Drawn by: 60286
O2 Saturation: 98.9 %
Patient temperature: 37
pCO2 arterial: 40 mmHg (ref 32–48)
pH, Arterial: 7.43 (ref 7.35–7.45)
pO2, Arterial: 101 mmHg (ref 83–108)

## 2022-03-12 LAB — COMPREHENSIVE METABOLIC PANEL
ALT: 19 U/L (ref 0–44)
AST: 20 U/L (ref 15–41)
Albumin: 4.3 g/dL (ref 3.5–5.0)
Alkaline Phosphatase: 44 U/L (ref 38–126)
Anion gap: 7 (ref 5–15)
BUN: 18 mg/dL (ref 8–23)
CO2: 21 mmol/L — ABNORMAL LOW (ref 22–32)
Calcium: 8.9 mg/dL (ref 8.9–10.3)
Chloride: 108 mmol/L (ref 98–111)
Creatinine, Ser: 1.06 mg/dL (ref 0.61–1.24)
GFR, Estimated: >60 mL/min (ref >60)
Glucose, Bld: 94 mg/dL (ref 70–99)
Potassium: 4.2 mmol/L (ref 3.5–5.1)
Sodium: 136 mmol/L (ref 135–145)
Total Bilirubin: 0.7 mg/dL (ref 0.3–1.2)
Total Protein: 6.3 g/dL — ABNORMAL LOW (ref 6.5–8.1)

## 2022-03-12 LAB — PROTIME-INR
INR: 1 (ref 0.8–1.2)
Prothrombin Time: 12.7 seconds (ref 11.4–15.2)

## 2022-03-12 LAB — APTT: aPTT: 27 seconds (ref 24–36)

## 2022-03-12 LAB — SARS CORONAVIRUS 2 (TAT 6-24 HRS): SARS Coronavirus 2: NEGATIVE

## 2022-03-12 LAB — SURGICAL PCR SCREEN

## 2022-03-12 NOTE — Progress Notes (Signed)
Surgical Instructions ? ? ? Your procedure is scheduled on Thursday, May 18th, 2023. ? ? Report to [facility] Main Entrance "A" at 05:30 A.M., then check in with the Admitting office. ? Call this number if you have problems the morning of surgery: ? [phone] ? ? If you have any questions prior to your surgery date call [phone]: Open Monday-Friday 8am-4pm ? ? ? Remember: ? Do not eat or drink after midnight the night before your surgery ?  ? Take these medicines the morning of surgery with A SIP OF WATER: NONE ? ?If needed: ? ?albuterol (VENTOLIN HFA) ?Budeson-Glycopyrrol-Formoterol (BREZTRI AEROSPHERE)  ? ?Please bring all inhalers with you the day of surgery.   ? ?As of today, STOP taking any Aspirin (unless otherwise instructed by your surgeon) Aleve, Naproxen, Ibuprofen, Motrin, Advil, Goody's, BC's, all herbal medications, fish oil, and all vitamins. ? ? The day of surgery: ?         ?Do not wear jewelry  ?Do not wear lotions, powders, colognes, or deodorant. ?Men may shave face and neck. ?Do not bring valuables to the hospital. ? ? ?[facility] is not responsible for any belongings or valuables. .  ? ?Do NOT Smoke (Tobacco/Vaping)  24 hours prior to your procedure ? ?If you use a CPAP at night, you may bring your mask for your overnight stay. ?  ?Contacts, glasses, hearing aids, dentures or partials may not be worn into surgery, please bring cases for these belongings ?  ?For patients admitted to the hospital, discharge time will be determined by your treatment team. ?  ?Patients discharged the day of surgery will not be allowed to drive home, and someone needs to stay with them for 24 hours. ? ? ?SURGICAL WAITING ROOM VISITATION ?Patients having surgery or a procedure in a hospital may have two support people. ?Children under the age of 40 must have an adult with them who is not the patient. ?They may stay in the waiting area during the procedure and may switch out with other visitors. If the patient  needs to stay at the hospital during part of their recovery, the visitor guidelines for inpatient rooms apply. ? ?Please refer to the [facility] website for the visitor guidelines for Inpatients (after your surgery is over and you are in a regular room).  ? ?Special instructions:   ? ?Oral Hygiene is also important to reduce your risk of infection.  Remember - BRUSH YOUR TEETH THE MORNING OF SURGERY WITH YOUR REGULAR TOOTHPASTE ? ? ?[place]- Preparing For Surgery ? ?Before surgery, you can play an important role. Because skin is not sterile, your skin needs to be as free of germs as possible. You can reduce the number of germs on your skin by washing with CHG (chlorahexidine gluconate) Soap before surgery.  CHG is an antiseptic cleaner which kills germs and bonds with the skin to continue killing germs even after washing.   ? ? ?Please do not use if you have an allergy to CHG or antibacterial soaps. If your skin becomes reddened/irritated stop using the CHG.  ?Do not shave (including legs and underarms) for at least 48 hours prior to first CHG shower. It is OK to shave your face. ? ?Please follow these instructions carefully. ?  ? ? Shower the NIGHT BEFORE SURGERY and the MORNING OF SURGERY with CHG Soap.  ? If you chose to wash your hair, wash your hair first as usual with your normal shampoo. After you shampoo, rinse  your hair and body thoroughly to remove the shampoo.  Then [employer] and genitals (private parts) with your normal soap and rinse thoroughly to remove soap. ? ?After that Use CHG Soap as you would any other liquid soap. You can apply CHG directly to the skin and wash gently with a scrungie or a clean washcloth.  ? ?Apply the CHG Soap to your body ONLY FROM THE NECK DOWN.  Do not use on open wounds or open sores. Avoid contact with your eyes, ears, mouth and genitals (private parts). Wash Face and genitals (private parts)  with your normal soap.  ? ?Wash thoroughly, paying special attention to the  area where your surgery will be performed. ? ?Thoroughly rinse your body with warm water from the neck down. ? ?DO NOT shower/wash with your normal soap after using and rinsing off the CHG Soap. ? ?Pat yourself dry with a CLEAN TOWEL. ? ?Wear CLEAN PAJAMAS to bed the night before surgery ? ?Place CLEAN SHEETS on your bed the night before your surgery ? ?DO NOT SLEEP WITH PETS. ? ? ?Day of Surgery: ? ?Take a shower with CHG soap. ?Wear Clean/Comfortable clothing the morning of surgery ?Do not apply any deodorants/lotions.   ?Remember to brush your teeth WITH YOUR REGULAR TOOTHPASTE. ? ? ? ?If you received a COVID test during your pre-op visit, it is requested that you wear a mask when out in public, stay away from anyone that may not be feeling well, and notify your surgeon if you develop symptoms. If you have been in contact with anyone that has tested positive in the last 10 days, please notify your surgeon. ? ?  ?Please read over the following fact sheets that you were given.   ?

## 2022-03-12 NOTE — Progress Notes (Signed)
PCP - Tommi Rumps, MD ?Cardiologist - denies ? ?PPM/ICD - denies ?Device Orders - n/a ?Rep Notified - n/a ? ?Chest x-ray - 03/12/2022 ?EKG - 03/12/2022 ?Stress Test -  ?ECHO - denies ?Cardiac Cath - denies ? ?Sleep Study - denies ?CPAP - n/a ? ?Fasting Blood Sugar - n/a ? ?Blood Thinner Instructions: n/a ? ?Aspirin Instructions: Patient was instructed: As of today, STOP taking any Aspirin (unless otherwise instructed by your surgeon) Aleve, Naproxen, Ibuprofen, Motrin, Advil, Goody's, BC's, all herbal medications, fish oil, and all vitamins ? ?ERAS Protcol - n/a ? ?COVID TEST- done in PAT on 03/12/2022 ? ? ?Anesthesia review: no ? ?Patient denies shortness of breath, fever, cough and chest pain at PAT appointment ? ? ?All instructions explained to the patient, with a verbal understanding of the material. Patient agrees to go over the instructions while at home for a better understanding. Patient also instructed to self quarantine after being tested for COVID-19. The opportunity to ask questions was provided. ?  ?

## 2022-03-13 NOTE — Progress Notes (Signed)
Received email from microbiology stating the PCR was invalid x2.  Please recollect day of surgery.  Order has been placed under sign and held orders ?

## 2022-03-14 ENCOUNTER — Inpatient Hospital Stay (HOSPITAL_COMMUNITY): Payer: BC Managed Care – PPO | Admitting: Certified Registered Nurse Anesthetist

## 2022-03-14 ENCOUNTER — Encounter (HOSPITAL_COMMUNITY)
Admission: RE | Disposition: A | Discharge status: Home / Self Care | Payer: Self-pay | Source: Home / Self Care | Attending: Thoracic Surgery (Cardiothoracic Vascular Surgery)

## 2022-03-14 ENCOUNTER — Other Ambulatory Visit: Payer: Self-pay

## 2022-03-14 ENCOUNTER — Inpatient Hospital Stay (HOSPITAL_COMMUNITY)
Admission: RE | Admit: 2022-03-14 | Discharge: 2022-03-16 | DRG: 165 | Disposition: A | Discharge status: Home / Self Care | Payer: BC Managed Care – PPO | Attending: Thoracic Surgery (Cardiothoracic Vascular Surgery) | Admitting: Thoracic Surgery (Cardiothoracic Vascular Surgery)

## 2022-03-14 ENCOUNTER — Inpatient Hospital Stay (HOSPITAL_COMMUNITY): Payer: BC Managed Care – PPO

## 2022-03-14 ENCOUNTER — Encounter (HOSPITAL_COMMUNITY): Payer: Self-pay | Admitting: Thoracic Surgery (Cardiothoracic Vascular Surgery)

## 2022-03-14 DIAGNOSIS — Z801 Family history of malignant neoplasm of trachea, bronchus and lung: Secondary | ICD-10-CM

## 2022-03-14 DIAGNOSIS — J8489 Other specified interstitial pulmonary diseases: Secondary | ICD-10-CM | POA: Diagnosis present

## 2022-03-14 DIAGNOSIS — D71 Functional disorders of polymorphonuclear neutrophils: Secondary | ICD-10-CM | POA: Diagnosis present

## 2022-03-14 DIAGNOSIS — Z20822 Contact with and (suspected) exposure to covid-19: Secondary | ICD-10-CM | POA: Diagnosis present

## 2022-03-14 DIAGNOSIS — Z808 Family history of malignant neoplasm of other organs or systems: Secondary | ICD-10-CM | POA: Diagnosis not present

## 2022-03-14 DIAGNOSIS — Z8249 Family history of ischemic heart disease and other diseases of the circulatory system: Secondary | ICD-10-CM | POA: Diagnosis not present

## 2022-03-14 DIAGNOSIS — F17210 Nicotine dependence, cigarettes, uncomplicated: Secondary | ICD-10-CM | POA: Diagnosis present

## 2022-03-14 DIAGNOSIS — Z01818 Encounter for other preprocedural examination: Secondary | ICD-10-CM | POA: Diagnosis not present

## 2022-03-14 DIAGNOSIS — Z833 Family history of diabetes mellitus: Secondary | ICD-10-CM

## 2022-03-14 DIAGNOSIS — Z85118 Personal history of other malignant neoplasm of bronchus and lung: Secondary | ICD-10-CM

## 2022-03-14 DIAGNOSIS — Z9889 Other specified postprocedural states: Secondary | ICD-10-CM

## 2022-03-14 DIAGNOSIS — Z825 Family history of asthma and other chronic lower respiratory diseases: Secondary | ICD-10-CM

## 2022-03-14 DIAGNOSIS — J8410 Pulmonary fibrosis, unspecified: Principal | ICD-10-CM | POA: Diagnosis present

## 2022-03-14 DIAGNOSIS — Z8261 Family history of arthritis: Secondary | ICD-10-CM

## 2022-03-14 DIAGNOSIS — Z807 Family history of other malignant neoplasms of lymphoid, hematopoietic and related tissues: Secondary | ICD-10-CM

## 2022-03-14 DIAGNOSIS — Z820 Family history of epilepsy and other diseases of the nervous system: Secondary | ICD-10-CM

## 2022-03-14 DIAGNOSIS — R911 Solitary pulmonary nodule: Secondary | ICD-10-CM

## 2022-03-14 DIAGNOSIS — K219 Gastro-esophageal reflux disease without esophagitis: Secondary | ICD-10-CM | POA: Diagnosis present

## 2022-03-14 DIAGNOSIS — Z86010 Personal history of colonic polyps: Secondary | ICD-10-CM | POA: Diagnosis not present

## 2022-03-14 DIAGNOSIS — Z800 Family history of malignant neoplasm of digestive organs: Secondary | ICD-10-CM

## 2022-03-14 DIAGNOSIS — Z8042 Family history of malignant neoplasm of prostate: Secondary | ICD-10-CM | POA: Diagnosis not present

## 2022-03-14 DIAGNOSIS — Z79899 Other long term (current) drug therapy: Secondary | ICD-10-CM

## 2022-03-14 HISTORY — PX: INTERCOSTAL NERVE BLOCK: SHX5021

## 2022-03-14 HISTORY — PX: LYMPH NODE DISSECTION: SHX5087

## 2022-03-14 LAB — POCT I-STAT 7, (LYTES, BLD GAS, ICA,H+H)
Acid-Base Excess: 1 mmol/L (ref 0.0–2.0)
Bicarbonate: 27.7 mmol/L (ref 20.0–28.0)
Calcium, Ion: 1.24 mmol/L (ref 1.15–1.40)
HCT: 43 % (ref 39.0–52.0)
Hemoglobin: 14.6 g/dL (ref 13.0–17.0)
O2 Saturation: 100 %
Potassium: 4.3 mmol/L (ref 3.5–5.1)
Sodium: 136 mmol/L (ref 135–145)
TCO2: 29 mmol/L (ref 22–32)
pCO2 arterial: 49.1 mmHg — ABNORMAL HIGH (ref 32–48)
pH, Arterial: 7.36 (ref 7.35–7.45)
pO2, Arterial: 397 mmHg — ABNORMAL HIGH (ref 83–108)

## 2022-03-14 LAB — SURGICAL PCR SCREEN
MRSA, PCR: NEGATIVE
Staphylococcus aureus: NEGATIVE

## 2022-03-14 LAB — ABO/RH: ABO/RH(D): B POS

## 2022-03-14 LAB — PREPARE RBC (CROSSMATCH)

## 2022-03-14 SURGERY — WEDGE RESECTION, LUNG, ROBOT-ASSISTED, THORACOSCOPIC
Anesthesia: General | Site: Chest | Laterality: Left

## 2022-03-14 MED ORDER — CHLORHEXIDINE GLUCONATE 0.12 % MT SOLN
15.0000 mL | Freq: Once | OROMUCOSAL | Status: AC
  Administered 2022-03-14: 15 mL via OROMUCOSAL

## 2022-03-14 MED ORDER — ROCURONIUM BROMIDE 10 MG/ML (PF) SYRINGE
PREFILLED_SYRINGE | INTRAVENOUS | Status: DC | PRN
Start: 2022-03-14 — End: 2022-03-14
  Administered 2022-03-14: 30 mg via INTRAVENOUS
  Administered 2022-03-14: 70 mg via INTRAVENOUS
  Administered 2022-03-14: 30 mg via INTRAVENOUS

## 2022-03-14 MED ORDER — CHLORHEXIDINE GLUCONATE CLOTH 2 % EX PADS
6.0000 | MEDICATED_PAD | Freq: Every day | CUTANEOUS | Status: DC
Start: 2022-03-15 — End: 2022-03-14

## 2022-03-14 MED ORDER — PROPOFOL 10 MG/ML IV BOLUS
INTRAVENOUS | Status: AC
  Filled 2022-03-14: qty 20

## 2022-03-14 MED ORDER — ACETAMINOPHEN 10 MG/ML IV SOLN: 1000.0000 mg | Freq: Once | INTRAVENOUS | Status: DC | PRN

## 2022-03-14 MED ORDER — ENOXAPARIN SODIUM 40 MG/0.4ML IJ SOSY
40.0000 mg | PREFILLED_SYRINGE | Freq: Every day | INTRAMUSCULAR | Status: DC
  Administered 2022-03-15: 40 mg via SUBCUTANEOUS
  Filled 2022-03-14: qty 0.4

## 2022-03-14 MED ORDER — MIDAZOLAM HCL 2 MG/2ML IJ SOLN
INTRAMUSCULAR | Status: AC
  Filled 2022-03-14: qty 2

## 2022-03-14 MED ORDER — SODIUM CHLORIDE 0.9 % IV SOLN: INTRAVENOUS | Status: DC

## 2022-03-14 MED ORDER — ACETAMINOPHEN 325 MG PO TABS: 325.0000 mg | ORAL_TABLET | ORAL | Status: DC | PRN

## 2022-03-14 MED ORDER — ROCURONIUM BROMIDE 10 MG/ML (PF) SYRINGE
PREFILLED_SYRINGE | INTRAVENOUS | Status: AC
  Filled 2022-03-14: qty 10

## 2022-03-14 MED ORDER — BISACODYL 5 MG PO TBEC
10.0000 mg | DELAYED_RELEASE_TABLET | Freq: Every day | ORAL | Status: DC
  Administered 2022-03-15: 10 mg via ORAL
  Filled 2022-03-14: qty 2

## 2022-03-14 MED ORDER — ACETAMINOPHEN 160 MG/5ML PO SOLN: 1000.0000 mg | Freq: Four times a day (QID) | ORAL | Status: DC

## 2022-03-14 MED ORDER — LACTATED RINGERS IV SOLN: INTRAVENOUS | Status: DC

## 2022-03-14 MED ORDER — OXYCODONE HCL 5 MG PO TABS
5.0000 mg | ORAL_TABLET | ORAL | Status: DC | PRN
  Administered 2022-03-14 – 2022-03-15 (×2): 10 mg via ORAL
  Filled 2022-03-14: qty 2

## 2022-03-14 MED ORDER — SODIUM CHLORIDE FLUSH 0.9 % IV SOLN
INTRAVENOUS | Status: DC | PRN
  Administered 2022-03-14: 87 mL

## 2022-03-14 MED ORDER — FLUTICASONE FUROATE-VILANTEROL 100-25 MCG/ACT IN AEPB
1.0000 | INHALATION_SPRAY | Freq: Every day | RESPIRATORY_TRACT | Status: DC
  Administered 2022-03-15 – 2022-03-16 (×2): 1 via RESPIRATORY_TRACT
  Filled 2022-03-14: qty 28

## 2022-03-14 MED ORDER — OXYCODONE HCL 5 MG PO TABS: 5.0000 mg | ORAL_TABLET | Freq: Once | ORAL | Status: DC | PRN

## 2022-03-14 MED ORDER — ONDANSETRON HCL 4 MG/2ML IJ SOLN
INTRAMUSCULAR | Status: DC | PRN
  Administered 2022-03-14: 4 mg via INTRAVENOUS

## 2022-03-14 MED ORDER — DEXAMETHASONE SODIUM PHOSPHATE 10 MG/ML IJ SOLN
INTRAMUSCULAR | Status: AC
  Filled 2022-03-14: qty 1

## 2022-03-14 MED ORDER — 0.9 % SODIUM CHLORIDE (POUR BTL) OPTIME
TOPICAL | Status: DC | PRN
  Administered 2022-03-14: 1000 mL

## 2022-03-14 MED ORDER — PHENYLEPHRINE HCL (PRESSORS) 10 MG/ML IV SOLN
INTRAVENOUS | Status: DC | PRN
  Administered 2022-03-14 (×3): 80 ug via INTRAVENOUS

## 2022-03-14 MED ORDER — ONDANSETRON HCL 4 MG/2ML IJ SOLN
INTRAMUSCULAR | Status: AC
  Filled 2022-03-14: qty 2

## 2022-03-14 MED ORDER — TRAMADOL HCL 50 MG PO TABS
50.0000 mg | ORAL_TABLET | Freq: Four times a day (QID) | ORAL | Status: DC | PRN
  Administered 2022-03-14 – 2022-03-15 (×3): 100 mg via ORAL
  Filled 2022-03-14 (×3): qty 2

## 2022-03-14 MED ORDER — ORAL CARE MOUTH RINSE: 15.0000 mL | Freq: Once | OROMUCOSAL | Status: AC

## 2022-03-14 MED ORDER — FENTANYL CITRATE (PF) 250 MCG/5ML IJ SOLN
INTRAMUSCULAR | Status: AC
  Filled 2022-03-14: qty 5

## 2022-03-14 MED ORDER — ALBUTEROL SULFATE (2.5 MG/3ML) 0.083% IN NEBU: 3.0000 mL | INHALATION_SOLUTION | Freq: Four times a day (QID) | RESPIRATORY_TRACT | Status: DC | PRN

## 2022-03-14 MED ORDER — MORPHINE SULFATE (PF) 2 MG/ML IV SOLN
2.0000 mg | INTRAVENOUS | Status: DC | PRN
  Administered 2022-03-14: 2 mg via INTRAVENOUS
  Filled 2022-03-14: qty 1

## 2022-03-14 MED ORDER — ONDANSETRON HCL 4 MG/2ML IJ SOLN: 4.0000 mg | Freq: Four times a day (QID) | INTRAMUSCULAR | Status: DC | PRN

## 2022-03-14 MED ORDER — SUGAMMADEX SODIUM 200 MG/2ML IV SOLN
INTRAVENOUS | Status: DC | PRN
Start: 2022-03-14 — End: 2022-03-14
  Administered 2022-03-14: 200 mg via INTRAVENOUS

## 2022-03-14 MED ORDER — KETOROLAC TROMETHAMINE 15 MG/ML IJ SOLN
15.0000 mg | Freq: Four times a day (QID) | INTRAMUSCULAR | Status: DC
  Administered 2022-03-14 – 2022-03-15 (×3): 15 mg via INTRAVENOUS
  Filled 2022-03-14 (×3): qty 1

## 2022-03-14 MED ORDER — CEFAZOLIN SODIUM-DEXTROSE 2-4 GM/100ML-% IV SOLN
INTRAVENOUS | Status: AC
  Filled 2022-03-14: qty 100

## 2022-03-14 MED ORDER — PHENYLEPHRINE HCL-NACL 20-0.9 MG/250ML-% IV SOLN
INTRAVENOUS | Status: DC | PRN
  Administered 2022-03-14: 20 ug/min via INTRAVENOUS

## 2022-03-14 MED ORDER — PROPOFOL 10 MG/ML IV BOLUS
INTRAVENOUS | Status: DC | PRN
  Administered 2022-03-14: 140 mg via INTRAVENOUS

## 2022-03-14 MED ORDER — OXYCODONE HCL 5 MG PO TABS
ORAL_TABLET | ORAL | Status: AC
  Filled 2022-03-14: qty 2

## 2022-03-14 MED ORDER — SODIUM CHLORIDE 0.9% IV SOLUTION: Freq: Once | INTRAVENOUS | Status: DC

## 2022-03-14 MED ORDER — FENTANYL CITRATE (PF) 100 MCG/2ML IJ SOLN
INTRAMUSCULAR | Status: AC
  Filled 2022-03-14: qty 2

## 2022-03-14 MED ORDER — SENNOSIDES-DOCUSATE SODIUM 8.6-50 MG PO TABS
1.0000 | ORAL_TABLET | Freq: Every day | ORAL | Status: DC
  Administered 2022-03-14: 1 via ORAL
  Filled 2022-03-14: qty 1

## 2022-03-14 MED ORDER — MIDAZOLAM HCL 5 MG/5ML IJ SOLN
INTRAMUSCULAR | Status: DC | PRN
  Administered 2022-03-14 (×2): 1 mg via INTRAVENOUS

## 2022-03-14 MED ORDER — CEFAZOLIN SODIUM-DEXTROSE 2-4 GM/100ML-% IV SOLN
2.0000 g | INTRAVENOUS | Status: AC
  Administered 2022-03-14: 2 g via INTRAVENOUS

## 2022-03-14 MED ORDER — CEFAZOLIN SODIUM-DEXTROSE 2-4 GM/100ML-% IV SOLN
2.0000 g | Freq: Three times a day (TID) | INTRAVENOUS | Status: AC
  Administered 2022-03-14 – 2022-03-15 (×2): 2 g via INTRAVENOUS
  Filled 2022-03-14 (×2): qty 100

## 2022-03-14 MED ORDER — UMECLIDINIUM BROMIDE 62.5 MCG/ACT IN AEPB
1.0000 | INHALATION_SPRAY | Freq: Every day | RESPIRATORY_TRACT | Status: DC
  Administered 2022-03-15 – 2022-03-16 (×2): 1 via RESPIRATORY_TRACT
  Filled 2022-03-14: qty 7

## 2022-03-14 MED ORDER — FENTANYL CITRATE (PF) 100 MCG/2ML IJ SOLN: 25.0000 ug | INTRAMUSCULAR | Status: DC | PRN

## 2022-03-14 MED ORDER — BUPIVACAINE HCL (PF) 0.5 % IJ SOLN
INTRAMUSCULAR | Status: AC
  Filled 2022-03-14: qty 30

## 2022-03-14 MED ORDER — BUDESON-GLYCOPYRROL-FORMOTEROL 160-9-4.8 MCG/ACT IN AERO: 2.0000 | INHALATION_SPRAY | Freq: Two times a day (BID) | RESPIRATORY_TRACT | Status: DC

## 2022-03-14 MED ORDER — LACTATED RINGERS IV SOLN: INTRAVENOUS | Status: DC | PRN

## 2022-03-14 MED ORDER — FENTANYL CITRATE (PF) 250 MCG/5ML IJ SOLN
INTRAMUSCULAR | Status: DC | PRN
  Administered 2022-03-14 (×5): 50 ug via INTRAVENOUS

## 2022-03-14 MED ORDER — OXYCODONE HCL 5 MG/5ML PO SOLN: 5.0000 mg | Freq: Once | ORAL | Status: DC | PRN

## 2022-03-14 MED ORDER — CHLORHEXIDINE GLUCONATE 0.12 % MT SOLN
OROMUCOSAL | Status: AC
  Filled 2022-03-14: qty 15

## 2022-03-14 MED ORDER — BUPIVACAINE LIPOSOME 1.3 % IJ SUSP
INTRAMUSCULAR | Status: AC
  Filled 2022-03-14: qty 20

## 2022-03-14 MED ORDER — LIDOCAINE 2% (20 MG/ML) 5 ML SYRINGE
INTRAMUSCULAR | Status: AC
  Filled 2022-03-14: qty 5

## 2022-03-14 MED ORDER — CHLORHEXIDINE GLUCONATE CLOTH 2 % EX PADS
6.0000 | MEDICATED_PAD | Freq: Every day | CUTANEOUS | Status: DC
  Administered 2022-03-14: 6 via TOPICAL

## 2022-03-14 MED ORDER — FENTANYL CITRATE (PF) 100 MCG/2ML IJ SOLN
25.0000 ug | INTRAMUSCULAR | Status: DC | PRN
  Administered 2022-03-14 (×2): 50 ug via INTRAVENOUS

## 2022-03-14 MED ORDER — AMISULPRIDE (ANTIEMETIC) 5 MG/2ML IV SOLN: 10.0000 mg | Freq: Once | INTRAVENOUS | Status: DC | PRN

## 2022-03-14 MED ORDER — ACETAMINOPHEN 160 MG/5ML PO SOLN: 325.0000 mg | ORAL | Status: DC | PRN

## 2022-03-14 MED ORDER — DEXAMETHASONE SODIUM PHOSPHATE 10 MG/ML IJ SOLN
INTRAMUSCULAR | Status: DC | PRN
Start: 2022-03-14 — End: 2022-03-14
  Administered 2022-03-14: 10 mg via INTRAVENOUS

## 2022-03-14 MED ORDER — ACETAMINOPHEN 500 MG PO TABS
1000.0000 mg | ORAL_TABLET | Freq: Four times a day (QID) | ORAL | Status: DC
  Administered 2022-03-14 – 2022-03-16 (×6): 1000 mg via ORAL
  Filled 2022-03-14 (×7): qty 2

## 2022-03-14 SURGICAL SUPPLY — 111 items
APPLIER CLIP ROT 10 11.4 M/L (STAPLE)
BLADE CLIPPER SURG (BLADE) ×2 IMPLANT
BNDG COHESIVE 6X5 TAN STRL LF (GAUZE/BANDAGES/DRESSINGS) IMPLANT
CANISTER SUCT 3000ML PPV (MISCELLANEOUS) ×4 IMPLANT
CANNULA REDUC XI 12-8 STAPL (CANNULA) ×2
CANNULA REDUCER 12-8 DVNC XI (CANNULA) ×2 IMPLANT
CLIP APPLIE ROT 10 11.4 M/L (STAPLE) IMPLANT
CLIP VESOCCLUDE MED 6/CT (CLIP) IMPLANT
CNTNR URN SCR LID CUP LEK RST (MISCELLANEOUS) ×5 IMPLANT
CONN ST 1/4X3/8  BEN (MISCELLANEOUS) ×1
CONN ST 1/4X3/8 BEN (MISCELLANEOUS) IMPLANT
CONT SPEC 4OZ STRL OR WHT (MISCELLANEOUS) ×11
DEFOGGER SCOPE WARMER CLEARIFY (MISCELLANEOUS) ×2 IMPLANT
DERMABOND ADVANCED (GAUZE/BANDAGES/DRESSINGS) ×1
DERMABOND ADVANCED .7 DNX12 (GAUZE/BANDAGES/DRESSINGS) ×1 IMPLANT
DRAIN CHANNEL 28F RND 3/8 FF (WOUND CARE) ×1 IMPLANT
DRAIN CHANNEL 32F RND 10.7 FF (WOUND CARE) IMPLANT
DRAPE ARM DVNC X/XI (DISPOSABLE) ×4 IMPLANT
DRAPE COLUMN DVNC XI (DISPOSABLE) ×1 IMPLANT
DRAPE CV SPLIT W-CLR ANES SCRN (DRAPES) ×2 IMPLANT
DRAPE DA VINCI XI ARM (DISPOSABLE) ×4
DRAPE DA VINCI XI COLUMN (DISPOSABLE) ×1
DRAPE HALF SHEET 40X57 (DRAPES) ×2 IMPLANT
DRAPE INCISE IOBAN 66X45 STRL (DRAPES) IMPLANT
DRAPE ORTHO SPLIT 77X108 STRL (DRAPES) ×1
DRAPE SURG ORHT 6 SPLT 77X108 (DRAPES) ×1 IMPLANT
ELECT BLADE 6.5 EXT (BLADE) IMPLANT
ELECT REM PT RETURN 9FT ADLT (ELECTROSURGICAL) ×2
ELECTRODE REM PT RTRN 9FT ADLT (ELECTROSURGICAL) ×1 IMPLANT
GAUZE 4X4 16PLY ~~LOC~~+RFID DBL (SPONGE) ×2 IMPLANT
GAUZE KITTNER 4X5 RF (MISCELLANEOUS) ×4 IMPLANT
GAUZE SPONGE 4X4 12PLY STRL (GAUZE/BANDAGES/DRESSINGS) ×2 IMPLANT
GLOVE ECLIPSE 6.5 STRL STRAW (GLOVE) ×1 IMPLANT
GLOVE SS BIOGEL STRL SZ 7.5 (GLOVE) IMPLANT
GLOVE SUPERSENSE BIOGEL SZ 7.5 (GLOVE) ×1
GLOVE SURG MICRO LTX SZ7.5 (GLOVE) ×4 IMPLANT
GOWN STRL REUS W/ TWL LRG LVL3 (GOWN DISPOSABLE) ×2 IMPLANT
GOWN STRL REUS W/ TWL XL LVL3 (GOWN DISPOSABLE) ×2 IMPLANT
GOWN STRL REUS W/TWL 2XL LVL3 (GOWN DISPOSABLE) ×2 IMPLANT
GOWN STRL REUS W/TWL LRG LVL3 (GOWN DISPOSABLE) ×2
GOWN STRL REUS W/TWL XL LVL3 (GOWN DISPOSABLE) ×2
HEMOSTAT SURGICEL 2X14 (HEMOSTASIS) ×6 IMPLANT
IRRIGATION STRYKERFLOW (MISCELLANEOUS) ×1 IMPLANT
IRRIGATOR STRYKERFLOW (MISCELLANEOUS) ×2
KIT BASIN OR (CUSTOM PROCEDURE TRAY) ×2 IMPLANT
KIT SUCTION CATH 14FR (SUCTIONS) IMPLANT
KIT TURNOVER KIT B (KITS) ×2 IMPLANT
NDL HYPO 25GX1X1/2 BEV (NEEDLE) ×1 IMPLANT
NDL SPNL 22GX3.5 QUINCKE BK (NEEDLE) ×1 IMPLANT
NEEDLE HYPO 25GX1X1/2 BEV (NEEDLE) ×2 IMPLANT
NEEDLE SPNL 22GX3.5 QUINCKE BK (NEEDLE) ×2 IMPLANT
NS IRRIG 1000ML POUR BTL (IV SOLUTION) ×2 IMPLANT
PACK CHEST (CUSTOM PROCEDURE TRAY) ×2 IMPLANT
PAD ARMBOARD 7.5X6 YLW CONV (MISCELLANEOUS) ×4 IMPLANT
PORT ACCESS TROCAR AIRSEAL 12 (TROCAR) ×1 IMPLANT
PORT ACCESS TROCAR AIRSEAL 5M (TROCAR) ×1
RELOAD STAPLE 45 3.5 BLU DVNC (STAPLE) IMPLANT
RELOAD STAPLE 45 4.3 GRN DVNC (STAPLE) IMPLANT
RELOAD STAPLE 45 4.6 BLK DVNC (STAPLE) IMPLANT
RELOAD STAPLER 3.5X45 BLU DVNC (STAPLE) ×1 IMPLANT
RELOAD STAPLER 4.3X45 GRN DVNC (STAPLE) ×2 IMPLANT
RELOAD STAPLER 45 4.6 BLK DVNC (STAPLE) ×2 IMPLANT
SCISSORS LAP 5X35 DISP (ENDOMECHANICALS) IMPLANT
SEAL CANN UNIV 5-8 DVNC XI (MISCELLANEOUS) ×2 IMPLANT
SEAL XI 5MM-8MM UNIVERSAL (MISCELLANEOUS) ×2
SEALANT PROGEL (MISCELLANEOUS) IMPLANT
SET TRI-LUMEN FLTR TB AIRSEAL (TUBING) ×2 IMPLANT
SOLUTION ELECTROLUBE (MISCELLANEOUS) ×2 IMPLANT
SPONGE INTESTINAL PEANUT (DISPOSABLE) IMPLANT
SPONGE T-LAP 18X18 ~~LOC~~+RFID (SPONGE) ×8 IMPLANT
SPONGE T-LAP 4X18 ~~LOC~~+RFID (SPONGE) ×2 IMPLANT
SPONGE TONSIL TAPE 1 RFD (DISPOSABLE) IMPLANT
STAPLER 45 SUREFORM CVD (STAPLE) ×1
STAPLER 45 SUREFORM CVD DVNC (STAPLE) IMPLANT
STAPLER CANNULA SEAL DVNC XI (STAPLE) ×2 IMPLANT
STAPLER CANNULA SEAL XI (STAPLE) ×2
STAPLER RELOAD 3.5X45 BLU DVNC (STAPLE) ×1
STAPLER RELOAD 3.5X45 BLUE (STAPLE) ×1
STAPLER RELOAD 4.3X45 GREEN (STAPLE) ×2
STAPLER RELOAD 4.3X45 GRN DVNC (STAPLE) ×2
STAPLER RELOAD 45 4.6 BLK (STAPLE) ×2
STAPLER RELOAD 45 4.6 BLK DVNC (STAPLE) ×2
SUT PDS AB 3-0 SH 27 (SUTURE) IMPLANT
SUT PROLENE 4 0 RB 1 (SUTURE)
SUT PROLENE 4-0 RB1 .5 CRCL 36 (SUTURE) IMPLANT
SUT SILK  1 MH (SUTURE) ×2
SUT SILK 1 MH (SUTURE) ×2 IMPLANT
SUT SILK 1 TIES 10X30 (SUTURE) IMPLANT
SUT SILK 2 0 SH (SUTURE) IMPLANT
SUT SILK 2 0SH CR/8 30 (SUTURE) IMPLANT
SUT SILK 3 0SH CR/8 30 (SUTURE) IMPLANT
SUT VIC AB 1 CTX 36 (SUTURE)
SUT VIC AB 1 CTX36XBRD ANBCTR (SUTURE) IMPLANT
SUT VIC AB 2-0 CTX 36 (SUTURE) IMPLANT
SUT VIC AB 3-0 MH 27 (SUTURE) IMPLANT
SUT VIC AB 3-0 X1 27 (SUTURE) ×4 IMPLANT
SUT VICRYL 0 TIES 12 18 (SUTURE) ×2 IMPLANT
SUT VICRYL 0 UR6 27IN ABS (SUTURE) ×4 IMPLANT
SUT VICRYL 2 TP 1 (SUTURE) IMPLANT
SYR 20ML LL LF (SYRINGE) ×4 IMPLANT
SYSTEM RETRIEVAL ANCHOR 12 (MISCELLANEOUS) ×1 IMPLANT
SYSTEM SAHARA CHEST DRAIN ATS (WOUND CARE) ×2 IMPLANT
TAPE CLOTH 4X10 WHT NS (GAUZE/BANDAGES/DRESSINGS) ×2 IMPLANT
TAPE CLOTH SURG 4X10 WHT LF (GAUZE/BANDAGES/DRESSINGS) ×1 IMPLANT
TIP APPLICATOR SPRAY EXTEND 16 (VASCULAR PRODUCTS) IMPLANT
TOWEL GREEN STERILE (TOWEL DISPOSABLE) ×4 IMPLANT
TRAY FOLEY MTR SLVR 16FR STAT (SET/KITS/TRAYS/PACK) ×2 IMPLANT
TROCAR BLADELESS 15MM (ENDOMECHANICALS) IMPLANT
TROCAR XCEL 12X100 BLDLESS (ENDOMECHANICALS) IMPLANT
TROCAR XCEL BLADELESS 5X75MML (TROCAR) IMPLANT
WATER STERILE IRR 1000ML POUR (IV SOLUTION) ×2 IMPLANT

## 2022-03-14 NOTE — Anesthesia Preprocedure Evaluation (Addendum)
Anesthesia Evaluation  Patient identified by MRN, date of birth, ID band Patient awake    Reviewed: Allergy & Precautions, NPO status , Patient's Chart, lab work & pertinent test results  Airway Mallampati: I  TM Distance: >3 FB Neck ROM: Full    Dental  (+) Missing, Teeth Intact, Dental Advisory Given,    Pulmonary COPD,  COPD inhaler, Current Smoker and Patient abstained from smoking.,     + decreased breath sounds      Cardiovascular negative cardio ROS   Rhythm:Regular Rate:Normal     Neuro/Psych PSYCHIATRIC DISORDERS Depression    GI/Hepatic Neg liver ROS, GERD  ,  Endo/Other  negative endocrine ROS  Renal/GU negative Renal ROS     Musculoskeletal  (+) Arthritis ,   Abdominal Normal abdominal exam  (+)   Peds  Hematology negative hematology ROS (+)   Anesthesia Other Findings   Reproductive/Obstetrics                            Anesthesia Physical Anesthesia Plan  ASA: 3  Anesthesia Plan: General   Post-op Pain Management:    Induction: Intravenous  PONV Risk Score and Plan: 2 and Ondansetron and Midazolam  Airway Management Planned: Double Lumen EBT  Additional Equipment: Arterial line, Ultrasound Guidance Line Placement and CVP  Intra-op Plan:   Post-operative Plan: Extubation in OR  Informed Consent: I have reviewed the patients History and Physical, chart, labs and discussed the procedure including the risks, benefits and alternatives for the proposed anesthesia with the patient or authorized representative who has indicated his/her understanding and acceptance.     Dental advisory given  Plan Discussed with: CRNA  Anesthesia Plan Comments:        Anesthesia Quick Evaluation

## 2022-03-14 NOTE — Anesthesia Procedure Notes (Signed)
Procedure Name: Intubation Date/Time: 03/14/2022 8:00 AM Performed by: Carmelina Paddock, RN Pre-anesthesia Checklist: Patient identified, Patient being monitored, Timeout performed, Emergency Drugs available and Suction available Patient Re-evaluated:Patient Re-evaluated prior to induction Oxygen Delivery Method: Circle System Utilized Preoxygenation: Pre-oxygenation with 100% oxygen Induction Type: IV induction Ventilation: Mask ventilation without difficulty Laryngoscope Size: Mac and 4 Grade View: Grade I Tube type: Oral Endobronchial tube: Left and Double lumen EBT and 39 Fr Number of attempts: 1 Airway Equipment and Method: Stylet Placement Confirmation: ETT inserted through vocal cords under direct vision, positive ETCO2 and breath sounds checked- equal and bilateral Tube secured with: Tape Dental Injury: Teeth and Oropharynx as per pre-operative assessment

## 2022-03-14 NOTE — Transfer of Care (Signed)
Immediate Anesthesia Transfer of Care Note  Patient: Paul Fields  Procedure(s) Performed: XI ROBOTIC ASSISTED THORASCOPY-WEDGE RESECTION (Left: Chest) INTERCOSTAL NERVE BLOCK (Left: Chest) LYMPH NODE DISSECTION (Left: Chest)  Patient Location: PACU  Anesthesia Type:General  Level of Consciousness: awake, alert , oriented, patient cooperative and responds to stimulation  Airway & Oxygen Therapy: Patient Spontanous Breathing  Post-op Assessment: Report given to RN and Post -op Vital signs reviewed and stable  Post vital signs: Reviewed and stable  Last Vitals:  Vitals Value Taken Time  BP 108/77 03/14/22 1030  Temp    Pulse 73 03/14/22 1032  Resp 16 03/14/22 1032  SpO2 98 % 03/14/22 1032  Vitals shown include unvalidated device data.  Last Pain:  Vitals:   03/14/22 0558  TempSrc:   PainSc: 0-No pain         Complications: No notable events documented.

## 2022-03-14 NOTE — Discharge Summary (Addendum)
Physician Discharge Summary  Patient ID: Paul Fields MRN: [id number] DOB/AGE: 63-Sep-1962 63 y.o.  Admit date: 03/14/2022 Discharge date: 03/17/2022  Admission Diagnoses:  Left upper lunge lobe nodule Nicotine dependence COPD GERD  Discharge Diagnoses:   Granuloma left upper lobe Nicotine dependence COPD GERD S/P robot-assisted surgical procedure   Discharged Condition: good  History of Present Illness Paul Fields is sent for consultation regarding a left upper lobe lung nodule.  Paul Fields is a 61 year old gentleman with a history of heavy tobacco abuse, emphysema, arthritis, and lung nodules.  He had been followed for lung nodules for several years.  In November 2022 there was a new left upper lobe nodule.  On follow-up the nodule had increased in size.  On PET/CT it was hypermetabolic with an SUV of 4.  A robotic bronchoscopy was performed.  It showed some atypical cells but was not diagnostic.  He did have a pneumothorax requiring tube placement post procedure.  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  He is not having any chest pain, pressure, or tightness.  No change in appetite or weight loss.  Does have a cough and occasional wheezing mostly at night.  Short of breath only with very heavy exertion.  No headaches or visual changes.   He was smoking about 2 packs/day prior to having his bronchoscopy.  Since then he has smoked about 2 packs of cigarettes in 2 weeks.  He is using patches which helps with the cravings.  Left upper lobe lung nodule-new in November 2022.  Significant increase in size in 6 months.  No evidence of hilar or mediastinal adenopathy.  Suspicious for new primary bronchogenic carcinoma.  Given his age, smoking history, appearance of the nodule, interval growth, and PET findings this has to be considered a new primary bronchogenic carcinoma unless it can be proven otherwise.  I recommended to Mr. and [name] that we proceed with robotic  left VATS for wedge resection and possible lingular sparing left upper lobectomy depending on the results of intraoperative frozen section.  I described the general nature of the procedure including the need for general anesthesia, the incisions to be used, the use of a drainage tube postoperatively, the expected hospital stay, and the overall recovery.  I informed him of the indications, risks, benefits, and alternatives.  He understands the risks include, but not limited to death, MI, DVT, PE, bleeding, possible need for transfusion, infection, prolonged air leak, cardiac arrhythmias, as well as possibility of other unforeseeable complications.  He understands and accepts the risks and wishes to proceed.  Tobacco abuse-heavy smoker for many years.  He has cut back dramatically since his bronchoscopy procedure.  I emphasized the importance of complete cessation and he is committed to try to do so.  Hospital Course: Paul Fields was admitted for elective surgery on 03/14/22 and taken to the OR where robotic-assisted left VATS was performed for wedge biopsy of the left upper lobe lesion and lymph node biopsy.  The intraoperative pathology report based on frozen section was c/w a benign granulomatous process. Following the procedure, he was recovered in the PACU and later transferred to [facility].  He remained stable with adequate pain control.  By the morning of the first postoperative day, he had been weaned to room air and was maintaining excellent oxygen saturation.  Chest x-ray showed no significant pneumothorax.  There was no air leak and only minimal drainage from the chest tube.  The chest tube was removed  on postop day 1 along with the Foley catheter and central line.  Follow-up chest x-ray showed No pneumothorax.  Lab work obtained on the first postoperative day showed a mild increase in the creatinine to 1.3.  Toradol was discontinued.  Follow-up lab on postop day 2 showed creatinine of  1.18. Paul Fields otherwise made a progressive and uneventful recovery.  Diet and activity were advanced and well-tolerated.  He had no difficulty voiding after removal of the Foley catheter.  He was felt to be ready for discharge to home on this date.  Final surgical pathology is pending.  Consults: None  PATHOLOGY: pending   Significant Diagnostic Studies:   CLINICAL DATA:  Chest tube removal   EXAM: CHEST - 1 VIEW SAME DAY   COMPARISON:  Same day study   FINDINGS: Interval removal of right IJ central line. Normal heart size. Aortic atherosclerosis. Suture material in the left upper lobe. Stable aeration of both lungs. No pleural effusion or pneumothorax. Old right mid clavicular fracture. Small amount of subcutaneous emphysema over the lower left chest wall laterally.   IMPRESSION: Interval removal of right IJ central line. No pneumothorax.     Electronically Signed   By: Davina Poke D.O.   On: 03/15/2022 13:29  Treatments:   Operative Report    DATE OF PROCEDURE: 03/14/2022   PREOPERATIVE DIAGNOSIS:  Left upper lobe lung nodule.   POSTOPERATIVE DIAGNOSIS:  Organizing pneumonia/granulomatous disease, left upper lobe.   PROCEDURE:  Xi robotic-assisted left thoracoscopy, wedge resection of left upper lobe nodule, lymph node dissection and intercostal nerve blocks levels 3 through 10.   SURGEON:  Revonda Standard. Roxan Hockey, MD   ASSISTANT:  Enid Cutter, PA   ANESTHESIA:  General.   FINDINGS:  Nodule clearly evident in the posterior superior aspect of left upper lobe.  Frozen section showed organizing pneumonia/granulomas, no malignancy seen.   CLINICAL NOTE:  The patient is a 61 year old man with a history of tobacco use, who was found to have a lung nodule in 08/2021.  There was an increase in size on followup, with no evidence of metastatic disease.  He underwent a robotic bronchoscopy,  which was nondiagnostic, but did show atypical cells.  He was  referred for consideration for surgical resection.  The indications, risks, benefits, and alternatives were discussed in detail with the patient.  He understood and accepted the risks and  agreed to proceed.  Discharge Exam: Blood pressure 121/63, pulse 71, temperature 98.5 F (36.9 C), temperature source Oral, resp. rate 14, height '5\' 10"'$  (1.778 m), weight 72 kg, SpO2 95 %.  General appearance: alert, cooperative, and no distress Heart: regular rate and rhythm Lungs: some crackles on right, mostly clear Abdomen: benign Extremities: no edema or calf tenderness Wound: incis healing well Disposition: Discharge disposition: 01-Home or Self Care      Discharged to home in stable condition.  Discharge Instructions     Discharge patient   Complete by: As directed    Discharge disposition: 01-Home or Self Care   Discharge patient date: 03/16/2022      Allergies as of 03/16/2022   No Known Allergies      Medication List     TAKE these medications    albuterol 108 (90 Base) MCG/ACT inhaler Commonly known as: VENTOLIN HFA Inhale 2 puffs into the lungs every 6 (six) hours as needed for wheezing or shortness of breath.   Breztri Aerosphere 160-9-4.8 MCG/ACT Aero Generic drug: Budeson-Glycopyrrol-Formoterol Inhale 2 puffs into the lungs  in the morning and at bedtime.   traMADol 50 MG tablet Commonly known as: ULTRAM Take 1 tablet (50 mg total) by mouth every 6 (six) hours as needed for up to 7 days (mild pain).        Follow-up Information     [name], MD. Go on 03/28/2022.   Specialty: Cardiothoracic Surgery Why: Your appointment is at 4:15 PM. Please arrive 30 minutes early for chest x-ray to be performed by [facility] located on the first floor of the same building. Contact information: [address] [place] [address] [phone]                 Signed: John Giovanni, PA-C 03/17/2022, 10:18 AM

## 2022-03-14 NOTE — Brief Op Note (Addendum)
03/14/2022  10:12 AM  PATIENT:  Paul Fields  61 y.o. male  PRE-OPERATIVE DIAGNOSIS:  LEFT UPPER LOBE LUNG NODULE  POST-OPERATIVE DIAGNOSIS:  ORGANIZING PNEUMONIA, GRANULOMATOUS DISEASE LEFT UPPER LOBE  PROCEDURE:  Procedure(s): XI ROBOTIC ASSISTED THORASCOPY-WEDGE RESECTION (Left) Wedge resection of left upper lobe mass INTERCOSTAL NERVE BLOCK (Left) LYMPH NODE DISSECTION (Left)  SURGEON:  [name], MD   PHYSICIAN ASSISTANT:  Roddenberry   ASSISTANTS: Martinique, Karrie S, RN, Scrub Person                      Romps, Sharlene Motts, Scrub Person   ANESTHESIA:   general  EBL:  50 mL   BLOOD ADMINISTERED:none  DRAINS:  51f Blake drain left pleural space    LOCAL MEDICATIONS USED: Exparel left intercostals and local   SPECIMEN:  Left upper lobe wedge and multiple lymph nodes  DISPOSITION OF SPECIMEN:  PATHOLOGY  COUNTS:  Correct  DICTATION: .Dragon Dictation  PLAN OF CARE: Admit to inpatient   PATIENT DISPOSITION:  PACU - hemodynamically stable.   Delay start of Pharmacological VTE agent (>24hrs) due to surgical blood loss or risk of bleeding: no

## 2022-03-14 NOTE — Anesthesia Procedure Notes (Signed)
Central Venous Catheter Insertion Performed by: Effie Berkshire, MD, anesthesiologist Start/End5/18/2023 6:45 AM, 03/14/2022 6:55 AM Patient location: Pre-op. Preanesthetic checklist: patient identified, IV checked, site marked, risks and benefits discussed, surgical consent, monitors and equipment checked, pre-op evaluation, timeout performed and anesthesia consent Position: Trendelenburg Lidocaine 1% used for infiltration and patient sedated Hand hygiene performed , maximum sterile barriers used  and Seldinger technique used Catheter size: 8 Fr Total catheter length 16. Central line was placed.Double lumen Procedure performed using ultrasound guided technique. Ultrasound Notes:anatomy identified, needle tip was noted to be adjacent to the nerve/plexus identified, no ultrasound evidence of intravascular and/or intraneural injection and image(s) printed for medical record Attempts: 1 Following insertion, dressing applied, line sutured and Biopatch. Post procedure assessment: blood return through all ports  Patient tolerated the procedure well with no immediate complications.

## 2022-03-14 NOTE — Discharge Instructions (Signed)

## 2022-03-14 NOTE — TOC Transition Note (Addendum)
Transition of Care [facility]) - CM/SW Discharge Note   Patient Details  Name: Paul Fields MRN: [id number] Date of Birth: 1960-12-23  Transition of Care [facility]) CM/SW Contact:  Angelita Ingles, RN Phone [phone]  03/14/2022, 4:08 PM   Clinical Narrative:     Transition of Care (TOC) Screening Note   Patient Details  Name: Paul Fields Date of Birth: 02-07-1961   Transition of Care [facility]) CM/SW Contact:    Angelita Ingles, RN Phone Number: 03/14/2022, 4:08 PM    Transition of Care Department [facility]) has reviewed patient and no TOC needs have been identified at this time. We will continue to monitor patient advancement through interdisciplinary progression rounds.            Patient Goals and CMS Choice        Discharge Placement                       Discharge Plan and Services                                     Social Determinants of Health (SDOH) Interventions     Readmission Risk Interventions     View : No data to display.

## 2022-03-14 NOTE — Anesthesia Procedure Notes (Signed)
Arterial Line Insertion Start/End5/18/2023 7:15 AM, 03/14/2022 7:20 AM Performed by: Effie Berkshire, MD, anesthesiologist  Patient location: Pre-op. Preanesthetic checklist: patient identified, IV checked, site marked, risks and benefits discussed, surgical consent, monitors and equipment checked, pre-op evaluation, timeout performed and anesthesia consent Lidocaine 1% used for infiltration Left, radial was placed Catheter size: 20 G Hand hygiene performed  and maximum sterile barriers used   Attempts: 4 Procedure performed without using ultrasound guided technique. Following insertion, dressing applied and Biopatch. Post procedure assessment: normal and unchanged  Post procedure complications: second provider assisted and unsuccessful attempts. Patient tolerated the procedure well with no immediate complications. Additional procedure comments: 2 attempts by CRNA on right, 1 attempt on right by MDA - unable to pass wire.   MDA x1 on left successful. Marland Kitchen

## 2022-03-14 NOTE — Anesthesia Postprocedure Evaluation (Signed)
Anesthesia Post Note  Patient: [name]  Procedure(s) Performed: XI ROBOTIC ASSISTED THORASCOPY-WEDGE RESECTION (Left: Chest) INTERCOSTAL NERVE BLOCK (Left: Chest) LYMPH NODE DISSECTION (Left: Chest)     Patient location during evaluation: PACU Anesthesia Type: General Level of consciousness: awake and alert Pain management: pain level controlled Vital Signs Assessment: post-procedure vital signs reviewed and stable Respiratory status: spontaneous breathing, nonlabored ventilation, respiratory function stable and patient connected to nasal cannula oxygen Cardiovascular status: blood pressure returned to baseline and stable Postop Assessment: no apparent nausea or vomiting Anesthetic complications: no   No notable events documented.  Last Vitals:  Vitals:   03/14/22 1400 03/14/22 1418  BP: 113/70   Pulse: 90   Resp: 14   Temp:  36.8 C  SpO2: 96%     Last Pain:  Vitals:   03/14/22 1418  TempSrc: Oral  PainSc: 5                  Paul Fields

## 2022-03-14 NOTE — Interval H&P Note (Signed)
History and Physical Interval Note:  03/14/2022 7:42 AM  Paul Fields  has presented today for surgery, with the diagnosis of LEFT UPPER LOBE LUNG NODULE.  The various methods of treatment have been discussed with the patient and family. After consideration of risks, benefits and other options for treatment, the patient has consented to  Procedure(s): XI ROBOTIC ASSISTED THORASCOPY-WEDGE RESECTION, possible lingular sparing left upper lobectomy (Left) as a surgical intervention.  The patient's history has been reviewed, patient examined, no change in status, stable for surgery.  I have reviewed the patient's chart and labs.  Questions were answered to the patient's satisfaction.     [name]

## 2022-03-14 NOTE — Hospital Course (Signed)
History of Present Illness [name] is sent for consultation regarding a left upper lobe lung nodule.  Paul Fields is a 61 year old gentleman with a history of heavy tobacco abuse, emphysema, arthritis, and lung nodules.  He had been followed for lung nodules for several years.  In November 2022 there was a new left upper lobe nodule.  On follow-up the nodule had increased in size.  On PET/CT it was hypermetabolic with an SUV of 4.  A robotic bronchoscopy was performed.  It showed some atypical cells but was not diagnostic.  He did have a pneumothorax requiring tube placement post procedure.  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  He is not having any chest pain, pressure, or tightness.  No change in appetite or weight loss.  Does have a cough and occasional wheezing mostly at night.  Short of breath only with very heavy exertion.  No headaches or visual changes.   He was smoking about 2 packs/day prior to having his bronchoscopy.  Since then he has smoked about 2 packs of cigarettes in 2 weeks.  He is using patches which helps with the cravings.  Left upper lobe lung nodule-new in November 2022.  Significant increase in size in 6 months.  No evidence of hilar or mediastinal adenopathy.  Suspicious for new primary bronchogenic carcinoma.  Given his age, smoking history, appearance of the nodule, interval growth, and PET findings this has to be considered a new primary bronchogenic carcinoma unless it can be proven otherwise.  I recommended to Paul Fields that we proceed with robotic left VATS for wedge resection and possible lingular sparing left upper lobectomy depending on the results of intraoperative frozen section.  I described the general nature of the procedure including the need for general anesthesia, the incisions to be used, the use of a drainage tube postoperatively, the expected hospital stay, and the overall recovery.  I informed him of the indications, risks,  benefits, and alternatives.  He understands the risks include, but not limited to death, MI, DVT, PE, bleeding, possible need for transfusion, infection, prolonged air leak, cardiac arrhythmias, as well as possibility of other unforeseeable complications.  He understands and accepts the risks and wishes to proceed.  Tobacco abuse-heavy smoker for many years.  He has cut back dramatically since his bronchoscopy procedure.  I emphasized the importance of complete cessation and he is committed to try to do so.  Hospital Course: Paul Fields was admitted for elective surgery on 03/14/22 and taken to the OR where robotic-assisted left VATS was performed for wedge biopsy of the left upper lobe lesion and lymph node biopsy.  The intraoperative pathology report based on frozen section was c/w a benign granulomatous process. Following the procedure, he was recovered in the PACU and later transferred to [facility].

## 2022-03-15 ENCOUNTER — Encounter (HOSPITAL_COMMUNITY): Payer: Self-pay | Admitting: Thoracic Surgery (Cardiothoracic Vascular Surgery)

## 2022-03-15 ENCOUNTER — Inpatient Hospital Stay (HOSPITAL_COMMUNITY): Payer: BC Managed Care – PPO

## 2022-03-15 LAB — BASIC METABOLIC PANEL
Anion gap: 7 (ref 5–15)
BUN: 17 mg/dL (ref 8–23)
CO2: 28 mmol/L (ref 22–32)
Calcium: 8.4 mg/dL — ABNORMAL LOW (ref 8.9–10.3)
Chloride: 102 mmol/L (ref 98–111)
Creatinine, Ser: 1.25 mg/dL — ABNORMAL HIGH (ref 0.61–1.24)
GFR, Estimated: >60 mL/min (ref >60)
Glucose, Bld: 131 mg/dL — ABNORMAL HIGH (ref 70–99)
Potassium: 4.2 mmol/L (ref 3.5–5.1)
Sodium: 137 mmol/L (ref 135–145)

## 2022-03-15 LAB — ACID FAST SMEAR (AFB, MYCOBACTERIA): Acid Fast Smear: NEGATIVE

## 2022-03-15 NOTE — Plan of Care (Signed)
  Problem: Clinical Measurements: Goal: Respiratory complications will improve Outcome: Progressing Goal: Cardiovascular complication will be avoided Outcome: Progressing   Problem: Activity: Goal: Risk for activity intolerance will decrease Outcome: Progressing   Problem: Nutrition: Goal: Adequate nutrition will be maintained Outcome: Progressing   Problem: Coping: Goal: Level of anxiety will decrease Outcome: Progressing   Problem: Elimination: Goal: Will not experience complications related to bowel motility Outcome: Progressing Goal: Will not experience complications related to urinary retention Outcome: Progressing   Problem: Pain Managment: Goal: General experience of comfort will improve Outcome: Not Progressing

## 2022-03-15 NOTE — Progress Notes (Addendum)
      [address]       [place] [address]             [phone]      1 Day Post-Op Procedure(s) (LRB): XI ROBOTIC ASSISTED THORASCOPY-WEDGE RESECTION (Left) INTERCOSTAL NERVE BLOCK (Left) LYMPH NODE DISSECTION (Left) Subjective: Awake and alert, sitting up in bed, just finished eating full breakfast.  Pain is well controlled.  Objective: Vital signs in last 24 hours: Temp:  [97.7 F (36.5 C)-98.3 F (36.8 C)] 98.3 F (36.8 C) (05/19 0434) Pulse Rate:  [68-90] 82 (05/19 0434) Cardiac Rhythm: Normal sinus rhythm (05/19 0709) Resp:  [10-20] 15 (05/19 0434) BP: (106-130)/(66-87) 106/71 (05/19 0434) SpO2:  [91 %-100 %] 94 % (05/19 0434) Arterial Line BP: (76-88)/(58-63) 77/63 (05/18 1100) FiO2 (%):  [28 %] 28 % (05/18 1054)  Hemodynamic parameters for last 24 hours:    Intake/Output from previous day: 05/18 0701 - 05/19 0700 In: 1710 [I.V.:1510; IV Piggyback:200] Out: 3505 [Urine:3425; Blood:50; Chest Tube:30] Intake/Output this shift: No intake/output data recorded.  General appearance: alert, cooperative, and no distress Neurologic: intact Heart: regular rate and rhythm Lungs: Breath sounds full, clear.  No airleak chest tube.  Minimal drainage overnight.  Chest x-ray shows good expansion of both lungs. Wound: The port incisions have expected bruising, skin edges well approximated and covered with Dermabond.  Lab Results: Recent Labs    03/12/22 1254 03/14/22 0811  WBC 7.5  --   HGB 15.4 14.6  HCT 45.4 43.0  PLT 260  --    BMET:  Recent Labs    03/12/22 1254 03/14/22 0811 03/15/22 0450  NA 136 136 137  K 4.2 4.3 4.2  CL 108  --  102  CO2 21*  --  28  GLUCOSE 94  --  131*  BUN 18  --  17  CREATININE 1.06  --  1.25*  CALCIUM 8.9  --  8.4*    PT/INR:  Recent Labs    03/12/22 1254  LABPROT 12.7  INR 1.0   ABG    Component Value Date/Time   PHART 7.360 03/14/2022 0811   HCO3 27.7 03/14/2022 0811   TCO2 29 03/14/2022 0811    O2SAT 100 03/14/2022 0811   CBG (last 3)  No results for input(s): GLUCAP in the last 72 hours.  Assessment/Plan: S/P Procedure(s) (LRB): XI ROBOTIC ASSISTED THORASCOPY-WEDGE RESECTION (Left) INTERCOSTAL NERVE BLOCK (Left) LYMPH NODE DISSECTION (Left)  -Postop day 1 left robotic assisted thoracoscopy with left upper lobe wedge resection.  Intraoperative frozen section showed this to be an organizing pneumonia or granulomatous process.  There is no air leak and the left lung is well expanded.  Expect we can remove the chest tube this morning.  DC IV fluids and Foley catheter.  Reassess later today and may be able to discharge to home this afternoon or tomorrow morning.  Small increase in the creatinine this AM, Toradol was discontinued.    LOS: 1 day    Antony Odea, PA-C [phone] 03/15/2022  Patient seen and examined, agree with above No air leak- dc chest tube Will keep overnight and recheck renal function in Am  Alayzha An C. Roxan Hockey, MD [facility] [phone]

## 2022-03-15 NOTE — Op Note (Signed)
NAME: Paul Paul Fields, Paul Paul Fields. MEDICAL RECORD NO: [id number] ACCOUNT NO: [id number] DATE OF BIRTH: April 28, 1961 FACILITY: [facility] LOCATION: [facility] PHYSICIAN: Revonda Standard. Roxan Hockey, MD  Operative Report   DATE OF PROCEDURE: 03/14/2022  PREOPERATIVE DIAGNOSIS:  Left upper lobe lung nodule.  POSTOPERATIVE DIAGNOSIS:  Organizing pneumonia/granulomatous disease, left upper lobe.  PROCEDURE:   Xi robotic-assisted left thoracoscopy,  Wedge resection of left upper lobe nodule,  Lymph node dissection and  Intercostal nerve blocks levels 3 through 10.  SURGEON:  Revonda Standard. Roxan Hockey, MD  ASSISTANT:  Enid Cutter, PA  ANESTHESIA:  General.  FINDINGS:  Nodule clearly evident in the posterior superior aspect of left upper lobe.  Frozen section showed organizing pneumonia/granulomas, no malignancy seen.  CLINICAL NOTE:  Paul Paul Fields is Paul Fields 61 year old man with Paul Fields history of tobacco use, who was found to have Paul Fields lung nodule in 08/2021.  There was an increase in size on followup, with no evidence of metastatic disease.  He underwent Paul Fields robotic bronchoscopy,  which was nondiagnostic, but did show atypical cells.  He was referred for consideration for surgical resection.  The indications, risks, benefits, and alternatives were discussed in detail with the patient.  He understood and accepted the risks and  agreed to proceed.  OPERATIVE NOTE:  Paul Paul Fields was brought to the preoperative holding area on 03/14/2022.  Anesthesia established intravenous access and placed an arterial blood pressure monitoring line.  He was taken to the operating room, anesthetized, and intubated  with Paul Fields double lumen endotracheal tube.  Intravenous antibiotics were administered.  Sequential compression devices were placed on the calves for DVT prophylaxis.  Paul Fields Foley catheter was placed.  He was placed in Paul Fields right lateral decubitus position.  Paul Fields Bair  Hugger was placed for active warming.  The left chest was prepped and draped in the  usual sterile fashion.  Paul Fields timeout was performed.  Paul Fields solution containing 20 mL of liposomal bupivacaine, 30 mL of 0.5% bupivacaine and 50 mL of saline was prepared.  This solution was used for local at the incision sites as well as for the intercostal nerve blocks.  An incision was made  in the eighth interspace in the midaxillary line, and an 8 mm port was inserted.  The thoracoscope was advanced into the chest. After confirming intrapleural placement, carbon dioxide was insufflated per protocol.  Paul Fields 12 mm robotic port was placed in the  eighth interspace anterior to the camera.  Intercostal nerve blocks then were performed from the third to the tenth interspace, injecting 10 mL of the bupivacaine solution into the subpleural plane at each level.  Two additional eighth interspace ports  were placed for the robot and then Paul Fields 12 mm AirSeal port was placed in the tenth interspace posterolaterally.  The robot was deployed.  The camera arm was docked.  The camera was inserted and targeting was performed.  The remaining arms were docked. The  robotic instruments were inserted with thoracoscopic visualization.  Inspection of the superior aspect of the fissure showed that it was relatively complete in that area. The site corresponding to the nodule on CT was evaluated and Paul Fields nodule was clearly present at that site.  Paul Fields wedge resection was performed, maintaining Paul Fields  wide margin on the area of at least 2 cm gross margin. Paul Fields combination of green, black and blue cartridges were used on the robotic stapler.  The specimen then was placed into an 8 mm endoscopic retrieval bag and removed from the chest.  The nodule was  clearly palpable and was marked and was sent for frozen section.  While awaiting the results of the frozen section, lymph node dissection was initiated.  The lower lobe was retracted superiorly.  The inferior ligament was divided with bipolar cautery.   All lymph nodes that were encountered were removed and  sent as separate specimens for permanent pathology.  The lung was retracted anteriorly and the pleural reflection was divided at the hilum posteriorly.  Level 8 and 7 nodes were removed.  Working  further superiorly, Paul Fields level 4L node was removed as well as Paul Fields level 10 node.  The level 10 node appeared abnormal, but appeared to be Paul Fields granulomatous node.  The frozen section returned showing 2 sites of disease.  There was Paul Fields smaller nodule, which was Paul Fields calcified  granuloma.  The larger nodule was an area of organizing pneumonia with some granulomatous changes.  No further resection was indicated.  The chest was copiously irrigated with saline and Paul Fields test inflation showed no air leakage from the staple line.  The  robotic instruments were removed and the robot was undocked.  Paul Fields 28-French Blake drain was placed through the anteriormost incision and directed to the apex.  It was secured with #1 silk suture.  The remaining incisions were closed in standard fashion.   Dermabond was applied.  Chest tube was placed to Paul Fields Pleur-evac on waterseal.  The patient was placed back in supine position.  He was extubated in the operating room and taken to the postanesthetic care unit in good condition.  All sponge, needle and  instrument counts were correct at the end of the procedure.  Experienced assistance was necessary for this case due to surgical complexity.  Enid Cutter served as Licensed conveyancer and assisted with port placement, robot docking, instrument exchange, suctioning and specimen retrieval as well as wound closure.   SHW D: 03/14/2022 5:31:02 pm T: 03/15/2022 12:14:00 am  JOB: [id number]/ [id number]

## 2022-03-16 ENCOUNTER — Inpatient Hospital Stay (HOSPITAL_COMMUNITY): Payer: BC Managed Care – PPO

## 2022-03-16 LAB — COMPREHENSIVE METABOLIC PANEL
ALT: 18 U/L (ref 0–44)
AST: 27 U/L (ref 15–41)
Albumin: 3.1 g/dL — ABNORMAL LOW (ref 3.5–5.0)
Alkaline Phosphatase: 43 U/L (ref 38–126)
Anion gap: 5 (ref 5–15)
BUN: 19 mg/dL (ref 8–23)
CO2: 28 mmol/L (ref 22–32)
Calcium: 8.5 mg/dL — ABNORMAL LOW (ref 8.9–10.3)
Chloride: 105 mmol/L (ref 98–111)
Creatinine, Ser: 1.18 mg/dL (ref 0.61–1.24)
GFR, Estimated: >60 mL/min (ref >60)
Glucose, Bld: 123 mg/dL — ABNORMAL HIGH (ref 70–99)
Potassium: 4.4 mmol/L (ref 3.5–5.1)
Sodium: 138 mmol/L (ref 135–145)
Total Bilirubin: 0.5 mg/dL (ref 0.3–1.2)
Total Protein: 5.1 g/dL — ABNORMAL LOW (ref 6.5–8.1)

## 2022-03-16 LAB — CBC
HCT: 39.5 % (ref 39.0–52.0)
Hemoglobin: 13.6 g/dL (ref 13.0–17.0)
MCH: 33.3 pg (ref 26.0–34.0)
MCHC: 34.4 g/dL (ref 30.0–36.0)
MCV: 96.8 fL (ref 80.0–100.0)
Platelets: 168 10*3/uL (ref 150–400)
RBC: 4.08 MIL/uL — ABNORMAL LOW (ref 4.22–5.81)
RDW: 12.6 % (ref 11.5–15.5)
WBC: 8.9 10*3/uL (ref 4.0–10.5)
nRBC: 0 % (ref 0.0–0.2)

## 2022-03-16 LAB — BPAM RBC
Blood Product Expiration Date: 202306062359
Blood Product Expiration Date: 202306062359
Unit Type and Rh: 7300
Unit Type and Rh: 7300

## 2022-03-16 LAB — TYPE AND SCREEN
ABO/RH(D): B POS
Antibody Screen: NEGATIVE
Unit division: 0
Unit division: 0

## 2022-03-16 MED ORDER — TRAMADOL HCL 50 MG PO TABS: 50.0000 mg | ORAL_TABLET | Freq: Four times a day (QID) | ORAL | 0 refills | Status: AC | PRN

## 2022-03-16 NOTE — Progress Notes (Signed)
[address]       [place] [address]             [phone]      2 Days Post-Op Procedure(s) (LRB): XI ROBOTIC ASSISTED THORASCOPY-WEDGE RESECTION (Left) INTERCOSTAL NERVE BLOCK (Left) LYMPH NODE DISSECTION (Left) Subjective: Feels well  Objective: Vital signs in last 24 hours: Temp:  [98.4 F (36.9 C)-98.7 F (37.1 C)] 98.5 F (36.9 C) (05/20 0450) Pulse Rate:  [71-82] 75 (05/20 0450) Cardiac Rhythm: Normal sinus rhythm (05/20 0705) Resp:  [14-20] 17 (05/20 0450) BP: (103-126)/(60-91) 105/62 (05/20 0450) SpO2:  [93 %-99 %] 96 % (05/20 0450)  Hemodynamic parameters for last 24 hours:    Intake/Output from previous day: 05/19 0701 - 05/20 0700 In: 540 [P.O.:540] Out: 1450 [Urine:1450] Intake/Output this shift: No intake/output data recorded.  General appearance: alert, cooperative, and no distress Heart: regular rate and rhythm Lungs: some crackles on right, mostly clear Abdomen: benign Extremities: no edema or calf tenderness Wound: incis healing well  Lab Results: Recent Labs    03/14/22 0811 03/16/22 0037  WBC  --  8.9  HGB 14.6 13.6  HCT 43.0 39.5  PLT  --  168   BMET:  Recent Labs    03/15/22 0450 03/16/22 0037  NA 137 138  K 4.2 4.4  CL 102 105  CO2 28 28  GLUCOSE 131* 123*  BUN 17 19  CREATININE 1.25* 1.18  CALCIUM 8.4* 8.5*    PT/INR: No results for input(s): LABPROT, INR in the last 72 hours. ABG    Component Value Date/Time   PHART 7.360 03/14/2022 0811   HCO3 27.7 03/14/2022 0811   TCO2 29 03/14/2022 0811   O2SAT 100 03/14/2022 0811   CBG (last 3)  No results for input(s): GLUCAP in the last 72 hours.  Meds Scheduled Meds:  acetaminophen  1,000 mg Oral Q6H   Or   acetaminophen (TYLENOL) oral liquid 160 mg/5 mL  1,000 mg Oral Q6H   bisacodyl  10 mg Oral Daily   enoxaparin (LOVENOX) injection  40 mg Subcutaneous Daily   fluticasone furoate-vilanterol  1 puff Inhalation Daily   And   umeclidinium  bromide  1 puff Inhalation Daily   senna-docusate  1 tablet Oral QHS   Continuous Infusions: PRN Meds:.albuterol, morphine injection, ondansetron (ZOFRAN) IV, oxyCODONE, traMADol  Xrays DG Chest 1V REPEAT Same Day  Result Date: 03/15/2022 CLINICAL DATA:  Chest tube removal EXAM: CHEST - 1 VIEW SAME DAY COMPARISON:  Same day study FINDINGS: Interval removal of right IJ central line. Normal heart size. Aortic atherosclerosis. Suture material in the left upper lobe. Stable aeration of both lungs. No pleural effusion or pneumothorax. Old right mid clavicular fracture. Small amount of subcutaneous emphysema over the lower left chest wall laterally. IMPRESSION: Interval removal of right IJ central line. No pneumothorax. Electronically Signed   By: Davina Poke D.O.   On: 03/15/2022 13:29   DG Chest Port 1 View  Result Date: 03/15/2022 CLINICAL DATA:  Chest pain status post robotic assisted thoracoscopy and wedge resection EXAM: PORTABLE CHEST 1 VIEW COMPARISON:  Chest radiograph 1 day prior FINDINGS: The right IJ vascular catheter is stable terminating in the upper SVC. The cardiomediastinal silhouette is stable. Lung aeration is unchanged, with no focal consolidation or pulmonary edema. There is no pleural effusion. Surgical material in the left upper lobe is unchanged. There is no appreciable pneumothorax. There is no acute osseous abnormality. IMPRESSION: Stable chest  with no new or worsening focal airspace disease. No appreciable pneumothorax. Electronically Signed   By: Valetta Mole M.D.   On: 03/15/2022 08:16   DG Chest Port 1 View  Result Date: 03/14/2022 CLINICAL DATA:  Status post robotic assisted wedge resection of left upper lobe lung nodule. EXAM: PORTABLE CHEST 1 VIEW COMPARISON:  Radiograph Mar 12, 2022. FINDINGS: Right IJ CVC with tip overlying the SVC. The heart size and mediastinal contours are within normal limits. Expected postsurgical change left upper lobe wedge resection is  present with chain staples visualized over the upper left hemithorax. No acute osseous abnormality. IMPRESSION: Expected postsurgical changes of the left upper lobe wedge resection. Electronically Signed   By: Dahlia Bailiff M.D.   On: 03/14/2022 10:51    Assessment/Plan: S/P Procedure(s) (LRB): XI ROBOTIC ASSISTED THORASCOPY-WEDGE RESECTION (Left) INTERCOSTAL NERVE BLOCK (Left) LYMPH NODE DISSECTION (Left) POD#2  1 afeb, VSS 2 sats are good on RA 3 excellent UOP 4 creat now back in normal range 5 non-anemic 6 CXR appears stable 7 stable for d/c      LOS: 2 days    John Giovanni PA-C Pager [phone] 03/16/2022

## 2022-03-20 ENCOUNTER — Telehealth: Payer: Self-pay | Admitting: *Deleted

## 2022-03-20 LAB — SURGICAL PATHOLOGY

## 2022-03-20 NOTE — Telephone Encounter (Signed)
Paul Fields contacted the office regarding a small white bump that has popped up on his left arm. Per patient, this is not where he had any lines or IV's placed. The bump does not hurt nor is it red. Patient states it is half way up his forearm to the side. Advised patient to continue to monitor area. Patient also states he has noticed "red blood" in his sputum. Patient states it is very scant. Patient denies it being dark red nor bright red. Patient denies SOB or fever. Advised patient to continue to monitor sputum. Patient verbalizes understanding.

## 2022-03-22 ENCOUNTER — Telehealth: Payer: Self-pay | Admitting: *Deleted

## 2022-03-22 ENCOUNTER — Ambulatory Visit
Admission: RE | Admit: 2022-03-22 | Discharge: 2022-03-22 | Disposition: A | Discharge status: Home / Self Care | Payer: BC Managed Care – PPO | Source: Ambulatory Visit | Attending: Thoracic Surgery (Cardiothoracic Vascular Surgery) | Admitting: Thoracic Surgery (Cardiothoracic Vascular Surgery)

## 2022-03-22 ENCOUNTER — Other Ambulatory Visit: Payer: Self-pay | Admitting: *Deleted

## 2022-03-22 DIAGNOSIS — Z9889 Other specified postprocedural states: Secondary | ICD-10-CM

## 2022-03-22 NOTE — Telephone Encounter (Signed)
-----   Message from Freddrick March, PA-C sent at 03/22/2022  2:02 PM EDT ----- Regarding: RE: review of CXR Alyssa,  No significant change in CXR.   He can take 800 mg TID for 72 hours and then prn.  .Heat or Ice are also appropriate. .. I suspect he pulled a muscle as he likely should not have stretched this close to his surgery.Marland Kitchen He can follow up with [facility] as scheduled next week.  Erin ----- Message ----- From: Ladon Applebaum, RN Sent: 03/22/2022   1:59 PM EDT To: Freddrick March, PA-C Subject: review of CXR                                  Hey,  Can you please check out this patient's cxr from today. He is s/p RATS 5/18 by [facility] and called this morning stating he had stretched and heard/felt a cracking sound in his chest. Patient states he has been in pain since feeling this. States it's difficult to do his breathing exercises. I got a cxr to just make sure everything was fine. The report is finalized.   Thanks, [employer]

## 2022-03-28 ENCOUNTER — Encounter: Payer: Self-pay | Admitting: Thoracic Surgery (Cardiothoracic Vascular Surgery)

## 2022-03-28 ENCOUNTER — Other Ambulatory Visit: Payer: Self-pay | Admitting: Thoracic Surgery (Cardiothoracic Vascular Surgery)

## 2022-03-28 ENCOUNTER — Ambulatory Visit
Admission: RE | Admit: 2022-03-28 | Discharge: 2022-03-28 | Disposition: A | Discharge status: Home / Self Care | Payer: BC Managed Care – PPO | Source: Ambulatory Visit | Attending: Thoracic Surgery (Cardiothoracic Vascular Surgery) | Admitting: Thoracic Surgery (Cardiothoracic Vascular Surgery)

## 2022-03-28 ENCOUNTER — Ambulatory Visit (INDEPENDENT_AMBULATORY_CARE_PROVIDER_SITE_OTHER): Payer: Self-pay | Admitting: Thoracic Surgery (Cardiothoracic Vascular Surgery)

## 2022-03-28 VITALS — BP 115/74 | HR 84 | Resp 20 | Ht 70.0 in | Wt 154.0 lb

## 2022-03-28 DIAGNOSIS — Z9889 Other specified postprocedural states: Secondary | ICD-10-CM

## 2022-03-28 DIAGNOSIS — R911 Solitary pulmonary nodule: Secondary | ICD-10-CM

## 2022-03-28 MED ORDER — NICOTINE 21 MG/24HR TD PT24: 21.0000 mg | MEDICATED_PATCH | Freq: Every day | TRANSDERMAL | 0 refills | Status: DC

## 2022-03-28 NOTE — Progress Notes (Signed)
[address]       [place] [address]             [phone]     HPI: [name] returns for scheduled postoperative follow-up visit after recent wedge resection.  Paul Fields is a 61 year old man with a history of heavy tobacco abuse, emphysema, arthritis, and lung nodules.  In November 2022 he developed a new left upper lobe lung nodule.  On follow-up scan the nodule increased in size.  It was hypermetabolic on PET with an SUV of 4.  Robotic bronchoscopy showed atypical cells but was not definitive.  He was referred for surgical resection.  I did a robotic left upper lobe wedge resection and node sampling on 03/14/2022.  The nodule turned out to be a 1.6 cm granuloma.  His postoperative course was unremarkable and he went home on day 2.  He does have some incisional pain.  He is not taking any narcotics.  He is using Tylenol.  He is anxious to increase his activities.  He is not having any respiratory issues.  Past Medical History:  Diagnosis Date   Arthritis    Cancer ([place])    lung   Clavicle fracture 1986   COPD (chronic obstructive pulmonary disease) ([place])    Diverticulosis    Elbow fracture, right 1968   Emphysema lung ([place])    Hemorrhoid    Major depressive disorder, recurrent episode ([place]) 03/07/2016   when mom passed    Polyp of colon, adenomatous    possible based on genetic testing    Current Outpatient Medications  Medication Sig Dispense Refill   albuterol (VENTOLIN HFA) 108 (90 Base) MCG/ACT inhaler Inhale 2 puffs into the lungs every 6 (six) hours as needed for wheezing or shortness of breath. 8 g 0   Budeson-Glycopyrrol-Formoterol (BREZTRI AEROSPHERE) 160-9-4.8 MCG/ACT AERO Inhale 2 puffs into the lungs in the morning and at bedtime. 5.9 g 0   nicotine (NICODERM CQ) 21 mg/24hr patch Place 1 patch (21 mg total) onto the skin daily. 28 patch 0   No current facility-administered medications for this visit.    Physical Exam BP  115/74 (BP Location: Left Arm, Patient Position: Sitting, Cuff Size: Normal)   Pulse 84   Resp 20   Ht '5\' 10"'$  (1.778 m)   Wt 154 lb (69.9 kg)   SpO2 95% Comment: RA  BMI 22.10 kg/m  Well-appearing 61 year old man in no acute distress Alert and oriented x3 with no focal deficits Lungs clear with equal breath sounds bilaterally Incisions clean dry and intact Cardiac regular rate and rhythm No peripheral edema  Diagnostic Tests: I personally reviewed his chest x-ray images.  Normal postoperative appearance.  Impression: Paul Fields is a 61 year old man with a history of heavy tobacco abuse, emphysema, arthritis, and lung nodules.  He was noted to have a new left upper lobe lung nodule in November 2022.  On a follow-up scan in 6 months that nodule increased in size.  Navigational bronchoscopy was not definitive.  I did a robotic left upper lobe wedge resection on 03/14/2022.  The nodule turned out to be a necrotizing granuloma.  He did well postoperatively and went home on day 2.  He had a necrotizing granuloma.  AFB and fungal stains were negative.  Cultures are still pending.  He is doing well postoperatively.  He does have some pain but is managing that with Tylenol and is not requiring any narcotics.  He is  anxious to increase his activities.  I advised him to build into new activities gradually.  He may drive on a limited basis.  Appropriate precautions were discussed.  Should not lift anything over 10 pounds for couple more weeks.  I think we are still looking at mid July for him to return to work.  Tobacco abuse-continues to smoke despite nicotine patch.  I asked him a prescription for stronger patch and emphasized the importance of him quitting smoking.  He may need pharmacologic support such as Wellbutrin or Chantix.  I will defer that to Dr. Patsey Berthold and Dr. Caryl Bis.  Plan: I sent a prescription for NicoDerm patch 21 mg daily Follow-up with Dr. Patsey Berthold I will be happy to  see [name] back at any time if I can be of any further assistance with his care  [name], MD [facility] [phone]

## 2022-04-04 ENCOUNTER — Ambulatory Visit: Payer: BC Managed Care – PPO | Admitting: Pulmonary Disease

## 2022-04-04 ENCOUNTER — Other Ambulatory Visit: Payer: Self-pay

## 2022-04-04 ENCOUNTER — Encounter: Payer: Self-pay | Admitting: Pulmonary Disease

## 2022-04-04 VITALS — BP 110/68 | HR 69 | Temp 97.9°F | Ht 70.0 in | Wt 157.4 lb

## 2022-04-04 DIAGNOSIS — Z122 Encounter for screening for malignant neoplasm of respiratory organs: Secondary | ICD-10-CM

## 2022-04-04 DIAGNOSIS — F17210 Nicotine dependence, cigarettes, uncomplicated: Secondary | ICD-10-CM

## 2022-04-04 DIAGNOSIS — Z87891 Personal history of nicotine dependence: Secondary | ICD-10-CM

## 2022-04-04 DIAGNOSIS — J8410 Pulmonary fibrosis, unspecified: Secondary | ICD-10-CM

## 2022-04-04 DIAGNOSIS — J449 Chronic obstructive pulmonary disease, unspecified: Secondary | ICD-10-CM | POA: Diagnosis not present

## 2022-04-04 MED ORDER — ALBUTEROL SULFATE HFA 108 (90 BASE) MCG/ACT IN AERS: 2.0000 | INHALATION_SPRAY | Freq: Four times a day (QID) | RESPIRATORY_TRACT | 0 refills | Status: DC | PRN

## 2022-04-04 MED ORDER — BREZTRI AEROSPHERE 160-9-4.8 MCG/ACT IN AERO: 2.0000 | INHALATION_SPRAY | Freq: Two times a day (BID) | RESPIRATORY_TRACT | 11 refills | Status: DC

## 2022-04-04 NOTE — Patient Instructions (Signed)
We have refilled your Breztri and your albuterol.  We will see him in follow-up in 2 months time call sooner should any new problems arise.

## 2022-04-04 NOTE — Progress Notes (Signed)
Subjective:    Patient ID: Paul Fields, male    DOB: 10-09-1961, 61 y.o.   MRN: [id number] Patient Care Team: [name], MD as PCP - General (Family Medicine)  Chief Complaint  Patient presents with   Follow-up    HPI Patient is a 61 year old current smoker (half PPD, 67.5 PY) who presents for follow-up on the issue of COPD and a left upper lobe lung nodule.  This is a follow-up after robotic bronchoscopy and subsequently wedge resection of the left upper lobe lesion previously noted.  Robotic bronchoscopy on 20 April was negative however there were atypical cells noted on the specimen.  In the microscopic views where concerning but not diagnostic of malignancy.  Patient was referred to Dr. Merilynn Finland and on 18 May he had wedge resection performed robotically of the left upper lobe.  Examination revealed that this was actually a granuloma which was necrotizing.  Fungal cultures and AFB cultures have been negative.  The patient appears to be recovering well from his resection.  Continues to have some soreness on the left chest but overall feels that he is doing better.  He has not been released to work yet.  He has not had any fevers, chills or sweats.  No increased shortness of breath.  Needs refills on his Breztri and as needed albuterol.  Overall feels that he is recovering well and doing well.  DATA 02/03/2018 PFTs: FEV1 2.10 L or 58% predicted, FVC 3.40 L or 75% predicted, FEV1/FVC 62%, significant bronchodilator response both in FEV1 and reduced air trapping.  Lung volumes show significant air trapping at 158% fusion capacity normal. 05/05/2018 PFTs: FEV1 2.59 L or 71% predicted, FVC 3.98 L or 88% predicted, FEV1/FVC 65%, no significant bronchodilator response, diffusion capacity normal.  Hyperinflation and air trapping noted. 01/17/2021 CT chest: Calcified mediastinal nodes, 6 mm posterior medial right upper lobe groundglass nodule and 0.6 mm nodule in the right upper  lobe. 04/17/2021 PFTs: FEV1 2.50 L or 75% predicted, FVC 4.01 L or 91% predicted, FEV1/FVC 62%. Hyperinflation and air trapping noted.  All airways component with bronchodilator response.  Diffusion capacity mildly reduced. 08/31/2021 CT chest: Interval development of a 7 mm irregular left upper lobe pulmonary nodule noncontrasted CT 3 to 6 months is recommended other pulmonary nodules stable.  Patient scheduled for a 3 month follow-up. 01/04/2022 CT chest: Interval enlargement of the previously noted left upper lobe pulmonary nodule measuring 1.2 x 0.5 cm. 01/22/2022 PET/CT: Hypermetabolic nodule in the left upper lobe concerning for primary bronchogenic carcinoma.  No evidence of mediastinal nodal metastasis.  No distant metastatic disease.  Scattered pulmonary nodules and calcified small mediastinal lymph nodes consistent with prior granulomatous disease.    Review of Systems A 10 point review of systems was performed and it is as noted above otherwise negative.  Patient Active Problem List   Diagnosis Date Noted   Pneumothorax after biopsy 02/14/2022    Priority: 1.   Nodule of upper lobe of left lung 04/04/2016    Priority: 2.   Centrilobular emphysema ([place]) 01/14/2018    Priority: 3.   Nicotine dependence, cigarettes, uncomplicated 47/65/4650    Priority: 4.   S/P robot-assisted surgical procedure 03/14/2022   Vertigo 05/11/2021   Fixed pupil of left eye 01/09/2021   GERD (gastroesophageal reflux disease) 04/24/2018   Personal history of tobacco use, presenting hazards to health 03/14/2016   Hx of adenomatous colonic polyps 08/11/2012   Social History   Tobacco Use  Smoking status: Every Day    Packs/day: 3.00    Years: 45.00    Total pack years: 135.00    Types: Cigarettes   Smokeless tobacco: Never   Tobacco comments:    0.5PPD 04/04/2022  Substance Use Topics   Alcohol use: No    Comment: History of 6 pk beer on weekends- Last [facility] 11/2015   No Known  Allergies  Current Meds  Medication Sig   albuterol (VENTOLIN HFA) 108 (90 Base) MCG/ACT inhaler Inhale 2 puffs into the lungs every 6 (six) hours as needed for wheezing or shortness of breath.   Budeson-Glycopyrrol-Formoterol (BREZTRI AEROSPHERE) 160-9-4.8 MCG/ACT AERO Inhale 2 puffs into the lungs in the morning and at bedtime.   nicotine (NICODERM CQ) 21 mg/24hr patch Place 1 patch (21 mg total) onto the skin daily.   Immunization History  Administered Date(s) Administered   Influenza,inj,Quad PF,6+ Mos 09/10/2013   PFIZER Comirnaty(Gray Top)Covid-19 Tri-Sucrose Vaccine 02/06/2020, 02/27/2020   PNEUMOCOCCAL CONJUGATE-20 01/09/2021   Pneumococcal Polysaccharide-23 05/27/2012   Tdap 05/27/2012   Zoster Recombinat (Shingrix) 01/09/2021, 04/17/2021       Objective:   Physical Exam BP 110/68 (BP Location: Left Arm, Cuff Size: Normal)   Pulse 69   Temp 97.9 F (36.6 C) (Oral)   Ht '5\' 10"'$  (1.778 m)   Wt 157 lb 6.4 oz (71.4 kg)   SpO2 100%   BMI 22.58 kg/m  GENERAL: Thin well-developed gentleman in no acute distress, fully ambulatory.  No conversational dyspnea. HEAD: Normocephalic, atraumatic. EYES: Pupils equal, round, reactive to light.  No scleral icterus. MOUTH: Poor dentition, oral mucosa moist. NECK: Supple. No thyromegaly. Trachea midline. No JVD.  No adenopathy. PULMONARY: Good air entry bilaterally.  Coarse, no other adventitious sounds.  CARDIOVASCULAR: S1 and S2. Regular rate and rhythm.  No rubs, murmurs or gallops heard. ABDOMEN: Benign. MUSCULOSKELETAL: No joint deformity, no clubbing, no edema. NEUROLOGIC: No focal deficit, no gait disturbance, speech is fluent. SKIN: Intact,warm,dry.  On limited exam, no rashes. PSYCH: Mood and behavior normal       Assessment & Plan:     ICD-10-CM   1. Pulmonary granuloma (HCC)  J84.10    Confirmed by wedge resection Can resume lung cancer screening program    2. COPD with asthma ([place])  J44.9 albuterol (VENTOLIN HFA)  108 (90 Base) MCG/ACT inhaler   New Breztri and as needed albuterol Medications refilled today    3. Tobacco dependence due to cigarettes  F17.210    Patient counseled regards to discontinuation of smoking Total counseling time 3 to 5 minutes Patient states now motivated to quit     Meds ordered this encounter  Medications   Budeson-Glycopyrrol-Formoterol (BREZTRI AEROSPHERE) 160-9-4.8 MCG/ACT AERO    Sig: Inhale 2 puffs into the lungs in the morning and at bedtime.    Dispense:  10.7 g    Refill:  11   albuterol (VENTOLIN HFA) 108 (90 Base) MCG/ACT inhaler    Sig: Inhale 2 puffs into the lungs every 6 (six) hours as needed for wheezing or shortness of breath.    Dispense:  8 g    Refill:  0   We will see the patient in follow-up in 2 months time he is to contact us prior to that time should any new difficulties arise.  Renold Don, MD Advanced Bronchoscopy P[facility]    *This note was dictated using voice recognition software/Dragon.  Despite best efforts to proofread, errors can occur which can change the  meaning. Any transcriptional errors that result from this process are unintentional and may not be fully corrected at the time of dictation.

## 2022-04-05 ENCOUNTER — Telehealth: Payer: Self-pay | Admitting: Family Medicine

## 2022-04-05 NOTE — Telephone Encounter (Signed)
Misty, RN from [place], [phone]. Ext # [phone]. She has tried to reach out to patient several times, unable to contact. She would like office to know that if patient ever needs any help with the cost of medications, Social Worker services, and she can educate on financial resources. These services are provided by patient's insurance.

## 2022-04-09 NOTE — Telephone Encounter (Signed)
Noted  

## 2022-04-12 LAB — FUNGUS CULTURE WITH STAIN

## 2022-04-12 LAB — FUNGAL ORGANISM REFLEX

## 2022-04-12 LAB — FUNGUS CULTURE RESULT

## 2022-04-18 ENCOUNTER — Telehealth: Payer: Self-pay

## 2022-04-18 NOTE — Telephone Encounter (Signed)
Patient contacted the office stating that he did not feel as if he could go back to work 7/17 when scheduled. He was cleared from a surgical standpoint at his last visit with Dr. Roxan Hockey. Advised that he would need to come into the office for evaluation and further discussion of return to work. He acknowledged receipt. Appointment made with a PA the week before he is to return back to work.

## 2022-04-24 ENCOUNTER — Ambulatory Visit (INDEPENDENT_AMBULATORY_CARE_PROVIDER_SITE_OTHER): Payer: BC Managed Care – PPO | Admitting: Family Medicine

## 2022-04-24 ENCOUNTER — Ambulatory Visit (INDEPENDENT_AMBULATORY_CARE_PROVIDER_SITE_OTHER): Payer: BC Managed Care – PPO

## 2022-04-24 ENCOUNTER — Encounter: Payer: Self-pay | Admitting: Family Medicine

## 2022-04-24 VITALS — BP 110/64 | HR 78 | Temp 98.0°F | Resp 20 | Ht 70.0 in | Wt 154.8 lb

## 2022-04-24 DIAGNOSIS — F17210 Nicotine dependence, cigarettes, uncomplicated: Secondary | ICD-10-CM | POA: Diagnosis not present

## 2022-04-24 DIAGNOSIS — R0781 Pleurodynia: Secondary | ICD-10-CM

## 2022-04-24 DIAGNOSIS — S2232XA Fracture of one rib, left side, initial encounter for closed fracture: Secondary | ICD-10-CM | POA: Insufficient documentation

## 2022-04-24 HISTORY — DX: Fracture of one rib, left side, initial encounter for closed fracture: S22.32XA

## 2022-04-24 MED ORDER — VARENICLINE TARTRATE 0.5 MG X 11 & 1 MG X 42 PO TBPK: ORAL_TABLET | ORAL | 0 refills | Status: DC

## 2022-04-24 NOTE — Assessment & Plan Note (Signed)
Possible rib strain though given his prior surgery we will get an x-ray of his ribs and chest.  I did discuss taking ibuprofen over-the-counter for pain.  I also offered a tramadol prescription though he noted he had some leftover from his surgery.  He noted no history of seizures.  We will see what his x-ray reveals and then determine if anything else needs to be done.

## 2022-04-24 NOTE — Progress Notes (Signed)
Tommi Rumps, MD Phone: [phone]  Paul Fields is a 61 y.o. male who presents today for same-day visit.  Left rib pain: Patient reports last week he developed some left-sided rib discomfort.  In May he had a left-sided wedge resection that ended up being a granuloma.  He had some discomfort at the surgical site at that time though it resolved.  He said no chest pain or shortness of breath.  He notes he cannot sleep on the left side.  He has not been taking anything for this.  He notes no recent injury.  Social History   Tobacco Use  Smoking Status Every Day   Packs/day: 3.00   Years: 45.00   Total pack years: 135.00   Types: Cigarettes  Smokeless Tobacco Never  Tobacco Comments   0.5PPD 04/04/2022    Current Outpatient Medications on File Prior to Visit  Medication Sig Dispense Refill   albuterol (VENTOLIN HFA) 108 (90 Base) MCG/ACT inhaler Inhale 2 puffs into the lungs every 6 (six) hours as needed for wheezing or shortness of breath. 8 g 0   Budeson-Glycopyrrol-Formoterol (BREZTRI AEROSPHERE) 160-9-4.8 MCG/ACT AERO Inhale 2 puffs into the lungs in the morning and at bedtime. 10.7 g 11   nicotine (NICODERM CQ) 21 mg/24hr patch Place 1 patch (21 mg total) onto the skin daily. 28 patch 0   No current facility-administered medications on file prior to visit.     ROS see history of present illness  Objective  Physical Exam Vitals:   04/24/22 1642  BP: 110/64  Pulse: 78  Resp: 20  Temp: 98 F (36.7 C)  SpO2: 95%    BP Readings from Last 3 Encounters:  04/24/22 110/64  04/04/22 110/68  03/28/22 115/74   Wt Readings from Last 3 Encounters:  04/24/22 154 lb 12.8 oz (70.2 kg)  04/04/22 157 lb 6.4 oz (71.4 kg)  03/28/22 154 lb (69.9 kg)    Physical Exam Constitutional:      General: He is not in acute distress.    Appearance: He is not diaphoretic.  Cardiovascular:     Rate and Rhythm: Normal rate and regular rhythm.     Heart sounds: Normal heart  sounds.  Pulmonary:     Effort: Pulmonary effort is normal.     Breath sounds: Normal breath sounds.  Musculoskeletal:       Back:  Skin:    General: Skin is warm and dry.  Neurological:     Mental Status: He is alert.      Assessment/Plan: Please see individual problem list.  Problem List Items Addressed This Visit     Nicotine dependence, cigarettes, uncomplicated    Patient is ready to quit smoking.  He is willing to try Chantix again.  He notes this is worked in the past.  Discussed monitoring for abnormal dreams.  Advised to pick a quit date and start the Chantix 1 week prior.      Relevant Medications   varenicline (CHANTIX PAK) 0.5 MG X 11 & 1 MG X 42 tablet   Rib pain - Primary    Possible rib strain though given his prior surgery we will get an x-ray of his ribs and chest.  I did discuss taking ibuprofen over-the-counter for pain.  I also offered a tramadol prescription though he noted he had some leftover from his surgery.  He noted no history of seizures.  We will see what his x-ray reveals and then determine if anything else needs to be  done.      Relevant Orders   DG Ribs Unilateral W/Chest Left     Return in about 3 months (around 07/25/2022), or if symptoms worsen or fail to improve, for CPE.   Tommi Rumps, MD [place]

## 2022-04-24 NOTE — Assessment & Plan Note (Signed)
Patient is ready to quit smoking.  He is willing to try Chantix again.  He notes this is worked in the past.  Discussed monitoring for abnormal dreams.  Advised to pick a quit date and start the Chantix 1 week prior.

## 2022-04-24 NOTE — Patient Instructions (Signed)
We will get an x-ray and contact you with the results.

## 2022-04-26 ENCOUNTER — Other Ambulatory Visit: Payer: Self-pay

## 2022-04-26 LAB — ACID FAST CULTURE WITH REFLEXED SENSITIVITIES (MYCOBACTERIA): Acid Fast Culture: NEGATIVE

## 2022-04-26 MED ORDER — NICOTINE 21 MG/24HR TD PT24: 21.0000 mg | MEDICATED_PATCH | Freq: Every day | TRANSDERMAL | 0 refills | Status: DC

## 2022-04-26 MED ORDER — VARENICLINE TARTRATE 0.5 MG X 11 & 1 MG X 42 PO TBPK: ORAL_TABLET | ORAL | 0 refills | Status: DC

## 2022-05-01 ENCOUNTER — Ambulatory Visit (INDEPENDENT_AMBULATORY_CARE_PROVIDER_SITE_OTHER): Payer: Self-pay | Admitting: Physician Assistant

## 2022-05-01 VITALS — BP 131/78 | HR 83 | Resp 20 | Ht 70.0 in | Wt 155.0 lb

## 2022-05-01 DIAGNOSIS — R911 Solitary pulmonary nodule: Secondary | ICD-10-CM

## 2022-05-01 DIAGNOSIS — Z9889 Other specified postprocedural states: Secondary | ICD-10-CM

## 2022-05-01 DIAGNOSIS — S2232XD Fracture of one rib, left side, subsequent encounter for fracture with routine healing: Secondary | ICD-10-CM

## 2022-05-01 NOTE — Patient Instructions (Signed)
You may return to work on 05/27/2022 without restriction.  Follow-up as needed

## 2022-05-01 NOTE — Progress Notes (Signed)
[address]       [place] [address]             [phone]       HPI: [name] is a 61 year old male with past history of heavy tobacco abuse, emphysema, arthritis, and pulmonary nodules.  He was noted to have a left upper lobe nodule in November 2022.  He underwent navigational biopsy that was not definitive.  PET scan showed this nodule to be hypermetabolic.  He underwent robotic left upper lobe wedge resection on 03/14/2022 by Dr. Roxan Hockey.  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  AFB and fungal cultures were negative.  He had an uncomplicated postoperative course and was discharged home on postop day 2.  He was seen in follow-up by Dr. Roxan Hockey on 03/28/2022.  At that time he was having some chest discomfort but was managing this with Tylenol alone.  He was recently seen by his PCP on 04/24/2022 and at that time reported worsening left-sided chest wall pain.  Chest x-ray with rib detail was obtained and demonstrated a "acute fracture of the left eighth rib".  Paul Fields was encouraged to use ibuprofen and tramadol which she had on hand for pain control.  He was referred to [place] for further evaluation. Paul Fields tells me he noticed a popping sensation in his left chest when he repositioned himself in bed several days ago.  He has had more discomfort in his left chest since that time.  When asked about the use of pain medication as previously recommended, he states "I do not like to take anything". 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  He requests an extension to July 31.   Current Outpatient Medications  Medication Sig Dispense Refill   albuterol (VENTOLIN HFA) 108 (90 Base) MCG/ACT inhaler Inhale 2 puffs into the lungs every 6 (six) hours as needed for wheezing or shortness of breath. 8 g 0   Budeson-Glycopyrrol-Formoterol  (BREZTRI AEROSPHERE) 160-9-4.8 MCG/ACT AERO Inhale 2 puffs into the lungs in the morning and at bedtime. 10.7 g 11   nicotine (NICODERM CQ) 21 mg/24hr patch Place 1 patch (21 mg total) onto the skin daily. 28 patch 0   varenicline (CHANTIX PAK) 0.5 MG X 11 & 1 MG X 42 tablet Take one 0.5 mg tablet by mouth once daily for 3 days, then increase to one 0.5 mg tablet twice daily for 4 days, then increase to one 1 mg tablet twice daily. 53 tablet 0   No current facility-administered medications for this visit.    Physical Exam: Vital signs BP 131/78 Pulse 83 Respirations 20 SPO2 98%  General: Mr. From [name] appears to be in mild distress related to his left chest discomfort. Chest: Moderate tenderness adjacent to the port incisions at his left lateral chest.  All the incisions have healed nicely.  There is no palpable deformity, hematoma, or induration.  Diagnostic Tests:  CLINICAL DATA:  Left rib pain   EXAM: LEFT RIBS AND CHEST - 3+ VIEW   COMPARISON:  None Available.   FINDINGS: The lungs are symmetrically well expanded. Surgical staple line noted at the left apex in keeping with wedge resection. No focal pulmonary infiltrate. No pneumothorax or pleural effusion. Cardiac size within normal limits. There is an acute fracture of the  left eighth rib laterally.   IMPRESSION: Acute left eighth rib fracture. No pneumothorax.     Electronically Signed   By: Fidela Salisbury M.D.   On: 04/25/2022 23:57  Impression/plan: 61 year old male with the above described past medical history underwent robotic assisted wedge resection of a left upper lobe mass that turned out to be a necrotizing granuloma.  AFB and fungal cultures are negative.  He recently saw his PCP and described increasing discomfort in his left lateral chest after experiencing a popping sensation while repositioning himself in bed.  The x-ray with left rib detail shows a fracture of the left rib.  Again, Paul Fields tells  me he is able to tolerate the pain without any medications.  I explained that there was no other treatment required and that this would heal on its own without intervention.  At this point, he does not feel he can return to work as previously planned on 05/13/2022.  I offered him a 2-week extension to 05/27/2022 and we have given him a letter for his employer to this effect. I explained that rib fractures were quite slow to heal and he may continue to have some soreness at that time.  If he does not feel he is able to return to work by 05/27/2022, he will contact us for further evaluation.  Antony Odea, PA-C [facility] [phone]

## 2022-05-07 ENCOUNTER — Telehealth: Payer: Self-pay | Admitting: *Deleted

## 2022-05-07 ENCOUNTER — Ambulatory Visit: Payer: BC Managed Care – PPO

## 2022-05-07 NOTE — Telephone Encounter (Signed)
Paperwork to extend LOA until 05/27/2022 faxed to [place] @ [phone]. Patient aware.

## 2022-06-17 ENCOUNTER — Telehealth: Payer: Self-pay | Admitting: Pulmonary Disease

## 2022-06-17 NOTE — Telephone Encounter (Signed)
Spoke to patient.  He would like a letter to excuse him from work until he follows up with Dr. Patsey Berthold on 06/20/2022. He reports of rib fracture. C/o left side rib pain and dry cough. Sx started after recent thorascopy wedge resection.   Dr. Patsey Berthold, please advise. Thanks

## 2022-06-17 NOTE — Telephone Encounter (Signed)
He needs to get the note from the thoracic team that saw him to diagnose the rib fracture.  I cannot give a note without having seen him for that problem.

## 2022-06-17 NOTE — Telephone Encounter (Signed)
Spoke to patient and relayed below message/recommendations. He voiced his understanding.  Nothing further needed.

## 2022-06-18 ENCOUNTER — Telehealth: Payer: Self-pay | Admitting: Family Medicine

## 2022-06-18 ENCOUNTER — Encounter: Payer: Self-pay | Admitting: Family

## 2022-06-18 ENCOUNTER — Ambulatory Visit: Payer: BC Managed Care – PPO | Admitting: Family

## 2022-06-18 VITALS — BP 136/78 | HR 82 | Temp 98.2°F | Ht 71.0 in | Wt 152.0 lb

## 2022-06-18 DIAGNOSIS — R0781 Pleurodynia: Secondary | ICD-10-CM | POA: Diagnosis not present

## 2022-06-18 MED ORDER — LIDOCAINE 5 % EX PTCH: 1.0000 | MEDICATED_PATCH | CUTANEOUS | 1 refills | Status: DC

## 2022-06-18 NOTE — Telephone Encounter (Signed)
Pharmacy called stating they do not have the Lidocaine 5 percent patches because they are out of stock and the pharmacy want the provider to approve the 4 percent over the counter licocadine patches

## 2022-06-18 NOTE — Patient Instructions (Signed)
Start naproxen twice daily with food as prescribed.  I think this medication will be most helpful at all.  I want you to be diligent with heat to the left side.  I also prescribed lidocaine patches.  You may use hydrocodone at night as it is sedating if needed for moderate to severe pain.  I placed a referral to sports medicine in [place] if pain persist.  We also can repeat x-ray images if you would like  Let us know if you dont hear back within a week in regards to an appointment being scheduled.

## 2022-06-18 NOTE — Progress Notes (Unsigned)
Subjective:    Patient ID: Paul Fields, male    DOB: 04-12-1961, 61 y.o.   MRN: [id number]  CC: Paul Fields is a 61 y.o. male who presents today for an acute visit.    HPI: Complains left sided anterior proximal to axillary rib discomfort x 4 days ago, he heard a popping sound when he turned to speak to grandchildren.    Endorses chronic dry cough.   Numbness to left side of chest.   He has been taking naproxen prn with hydrocodone as prescribed urgent care.   Pain to turn to left, when press on it. [place] when takes a deep breath.   No cp, sob, back pain.    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 He returned to work 05/27/22 and over the past couple of months, left sided rib pain has worsened 'little by little'.   He is completed chantix and smoking less, such as half pack per day.  No vaping.   No h/o GIB.    Seen 06/15/2022 for rib pain on the left side urgent care [facility]. Describes he had a surgery back in May of this year.  Seen by primary 04/24/2022 for left rib discomfort  Left chest and ribs 04/24/2022 with acute left eighth rib fracture.  No pneumothorax.  History of left-sided wedge resection Dr Roxan Hockey which was necrotiziing granuloma.    Seen by general surgery 05/01/2022  History of smoking, emphysema.  scheduled with Dr. Patsey Berthold in 2 days  HISTORY:  Past Medical History:  Diagnosis Date  . Arthritis   . Clavicle fracture 1986  . COPD (chronic obstructive pulmonary disease) ([place])   . Diverticulosis   . Elbow fracture, right 1968  . Emphysema lung ([place])   . Hemorrhoid   . Major depressive disorder, recurrent episode ([place]) 03/07/2016   when mom passed   . Polyp of colon, adenomatous    possible based on genetic testing   Past Surgical History:  Procedure Laterality Date  . COLONOSCOPY    . COLONOSCOPY W/ BIOPSIES  04/2015   polyps-benign  . ELBOW FRACTURE SURGERY Right 1967   Pins, then  hardware removal  . ELBOW HARDWARE REMOVAL Right 1969   right  . INTERCOSTAL NERVE BLOCK Left 03/14/2022   Procedure: INTERCOSTAL NERVE BLOCK;  Surgeon: [name], MD;  Location: [facility];  Service: Thoracic;  Laterality: Left;  . LYMPH NODE DISSECTION Left 03/14/2022   Procedure: LYMPH NODE DISSECTION;  Surgeon: [name], MD;  Location: [facility];  Service: Thoracic;  Laterality: Left;  . POLYPECTOMY    . polyps  2013   20 polyps biopsies as tubular adenomas  . right toe surgery  2018  . WISDOM TOOTH EXTRACTION  1983   Family History  Problem Relation Age of Onset  . COPD Father   . Arthritis Father   . Parkinsonism Mother   . Diabetes Mother        borderline  . Lung cancer Mother   . Cancer Mother        Lung  . Colon cancer Maternal Uncle        diagnosed in his 39s  . Alzheimer's disease Paternal Aunt   . COPD Paternal Uncle   . Lung cancer Maternal Grandfather        Ecologist  . Prostate cancer Maternal Uncle        diagnosed in his 90s  . Alzheimer's disease Paternal  Aunt   . Heart attack Paternal Uncle   . Brain cancer Paternal Uncle 26  . Lymphoma Cousin        paternal cousin  . COPD Brother        prostate  . Cancer Paternal Grandfather        lung  . Heart disease Neg Hx   . Stomach cancer Neg Hx   . Rectal cancer Neg Hx   . Colon polyps Neg Hx   . Esophageal cancer Neg Hx     Allergies: Patient has no known allergies. Current Outpatient Medications on File Prior to Visit  Medication Sig Dispense Refill  . albuterol (VENTOLIN HFA) 108 (90 Base) MCG/ACT inhaler Inhale 2 puffs into the lungs every 6 (six) hours as needed for wheezing or shortness of breath. 8 g 0  . Budeson-Glycopyrrol-Formoterol (BREZTRI AEROSPHERE) 160-9-4.8 MCG/ACT AERO Inhale 2 puffs into the lungs in the morning and at bedtime. 10.7 g 11  . HYDROcodone-acetaminophen (NORCO/VICODIN) 5-325 MG tablet Take one tablet at night for pain; may take up to every 6 hours as  needed for pain if not working or driving    . naproxen (NAPROSYN) 500 MG tablet Take by mouth.    . nicotine (NICODERM CQ) 21 mg/24hr patch Place 1 patch (21 mg total) onto the skin daily. 28 patch 0  . varenicline (CHANTIX PAK) 0.5 MG X 11 & 1 MG X 42 tablet Take one 0.5 mg tablet by mouth once daily for 3 days, then increase to one 0.5 mg tablet twice daily for 4 days, then increase to one 1 mg tablet twice daily. 53 tablet 0   No current facility-administered medications on file prior to visit.    Social History   Tobacco Use  . Smoking status: Every Day    Packs/day: 3.00    Years: 45.00    Total pack years: 135.00    Types: Cigarettes  . Smokeless tobacco: Never  . Tobacco comments:    0.5PPD 04/04/2022  Vaping Use  . Vaping Use: Never used  Substance Use Topics  . Alcohol use: No    Comment: History of 6 pk beer on weekends- Last [facility] 11/2015  . Drug use: No    Review of Systems    Objective:    BP 136/78   Pulse 82   Temp 98.2 F (36.8 C) (Oral)   Ht '5\' 11"'$  (1.803 m)   Wt 152 lb (68.9 kg)   SpO2 95%   BMI 21.20 kg/m   BP Readings from Last 3 Encounters:  06/18/22 136/78  05/01/22 131/78  04/24/22 110/64    Physical Exam     Assessment & Plan:      I am having Paul Fields maintain his [employer], albuterol, nicotine, varenicline, naproxen, and HYDROcodone-acetaminophen.   No orders of the defined types were placed in this encounter.   Return precautions given.   Risks, benefits, and alternatives of the medications and treatment plan prescribed today were discussed, and patient expressed understanding.   Education regarding symptom management and diagnosis given to patient on AVS.  Continue to follow with [name], MD for routine health maintenance.   Paul Fields and I agreed with plan.   Mable Paris, FNP

## 2022-06-19 NOTE — Assessment & Plan Note (Signed)
Acute on chronic.  Etiology of pain is nonspecific at this time. Per patient chest x-ray obtained at [facility] 06/15/22 did not reveal rib fracture.  Currently requesting this chest x-ray.  We opted not to pursue a second chest x-ray today.  In the absence of any back pain today, we opted not to pursue thoracic x-ray . we are aggreed second opinion with sports medicine and I placed a referral as ultrasound may be better modality.  In the meantime he will keep  following up with Dr. Patsey Berthold.  I advised heat and ice alternating.  He may also use a lidocaine pain patch. Work not provided and advised to stay out of work until symptom improves. Close follow up

## 2022-06-19 NOTE — Telephone Encounter (Signed)
Call patient Lidocaine patch 5% appears to be out of stock.  He may purchase 4% over-the-counter lidocaine patch.  There is a brand called Salonpas he may also try. Please make sure that we have called [facility] urgent care and requested chest x-ray.  I wanted a copy from 06/15/22

## 2022-06-20 ENCOUNTER — Ambulatory Visit (INDEPENDENT_AMBULATORY_CARE_PROVIDER_SITE_OTHER): Payer: BC Managed Care – PPO | Admitting: Pulmonary Disease

## 2022-06-20 ENCOUNTER — Encounter: Payer: Self-pay | Admitting: Pulmonary Disease

## 2022-06-20 VITALS — BP 124/70 | HR 78 | Temp 98.2°F | Ht 70.0 in | Wt 152.4 lb

## 2022-06-20 DIAGNOSIS — J449 Chronic obstructive pulmonary disease, unspecified: Secondary | ICD-10-CM | POA: Diagnosis not present

## 2022-06-20 DIAGNOSIS — F17210 Nicotine dependence, cigarettes, uncomplicated: Secondary | ICD-10-CM

## 2022-06-20 DIAGNOSIS — R0781 Pleurodynia: Secondary | ICD-10-CM | POA: Diagnosis not present

## 2022-06-20 MED ORDER — KETOROLAC TROMETHAMINE 10 MG PO TABS
10.0000 mg | ORAL_TABLET | Freq: Four times a day (QID) | ORAL | 0 refills | Status: DC | PRN
Start: 2022-06-20 — End: 2022-06-25

## 2022-06-20 NOTE — Telephone Encounter (Signed)
Spoke to patient in regards to his Lidocain patches. Patient is ok with getting the otc  4% [facility] to request chest xray from 06/15/22

## 2022-06-20 NOTE — Progress Notes (Signed)
Subjective:    Patient ID: Paul Fields, male    DOB: 16-Aug-1961, 61 y.o.   MRN: [id number] Patient Care Team: [name], MD as PCP - General (Family Medicine)  Chief Complaint  Patient presents with   Follow-up    HPI Patient is a 61 year old current smoker (half PPD, 67.5 PY) who presents for follow-up on the issue of COPD and a left upper lobe lung nodule.  Patient was last seen on 04 April 2022.  At that time he had been doing well after robotic bronchoscopy on 20 April and robotic assisted left upper lobe wedge resection on 18 May.  Patient had a necrotizing granuloma which was causing his abnormalities noted on chest CT and PET/CT. Fungal cultures and AFB cultures have been negative.  The patient at that time appeared to be recovering well from his resection with some minimal residual pain.  He was released to work and states that he was there less than 2 weeks when he started having left-sided anterior chest pain again and has been noted to have an acute eighth rib fracture.  He does not recall specific trauma however he works in a foundry.  He states that symptoms have been present for approximately a week after he heard a "pop" on his left chest.  He has not had any fevers, chills or sweats.  No increased shortness of breath over baseline.  He is irascible today and appears in discomfort.  He was given some lidocaine patches but this is not helping.  Was given Percocet but this was also not effective.  He was also provided with Naprosyn by primary care but also not effective.  He states that he will never have any interventions in his chest again.  I explained the rationale of why he had to have the interventions as he is exceedingly high risk due to his tobacco use history.  He is smoking half a pack of cigarettes per day.  Uses patches and Chantix quits for maybe a few days and then starts back up again.  He is currently under increased stress due to the imminent death of his  brother who is suffering from glioblastoma.  This is certainly weighing in on him as well.  No other symptomatology endorsed.  DATA 02/03/2018 PFTs: FEV1 2.10 L or 58% predicted, FVC 3.40 L or 75% predicted, FEV1/FVC 62%, significant bronchodilator response both in FEV1 and reduced air trapping.  Lung volumes show significant air trapping at 158% fusion capacity normal. 05/05/2018 PFTs: FEV1 2.59 L or 71% predicted, FVC 3.98 L or 88% predicted, FEV1/FVC 65%, no significant bronchodilator response, diffusion capacity normal.  Hyperinflation and air trapping noted. 01/17/2021 CT chest: Calcified mediastinal nodes, 6 mm posterior medial right upper lobe groundglass nodule and 0.6 mm nodule in the right upper lobe. 04/17/2021 PFTs: FEV1 2.50 L or 75% predicted, FVC 4.01 L or 91% predicted, FEV1/FVC 62%. Hyperinflation and air trapping noted.  All airways component with bronchodilator response.  Diffusion capacity mildly reduced. 08/31/2021 CT chest: Interval development of a 7 mm irregular left upper lobe pulmonary nodule noncontrasted CT 3 to 6 months is recommended other pulmonary nodules stable.  Patient scheduled for a 3 month follow-up. 01/04/2022 CT chest: Interval enlargement of the previously noted left upper lobe pulmonary nodule measuring 1.2 x 0.5 cm. 01/22/2022 PET/CT: Hypermetabolic nodule in the left upper lobe concerning for primary bronchogenic carcinoma.  No evidence of mediastinal nodal metastasis.  No distant metastatic disease.  Scattered pulmonary nodules  and calcified small mediastinal lymph nodes consistent with prior granulomatous disease.  Review of Systems A 10 point review of systems was performed and it is as noted above otherwise negative.  Patient Active Problem List   Diagnosis Date Noted   Pneumothorax after biopsy 02/14/2022   Nodule of upper lobe of left lung 04/04/2016   Centrilobular emphysema ([place]) 01/14/2018   Nicotine dependence, cigarettes, uncomplicated  00/86/7619   Rib pain 04/24/2022   S/P robot-assisted surgical procedure 03/14/2022   Vertigo 05/11/2021   Fixed pupil of left eye 01/09/2021   GERD (gastroesophageal reflux disease) 04/24/2018   Personal history of tobacco use, presenting hazards to health 03/14/2016   Hx of adenomatous colonic polyps 08/11/2012   Social History   Tobacco Use   Smoking status: Every Day    Packs/day: 3.00    Years: 45.00    Total pack years: 135.00    Types: Cigarettes   Smokeless tobacco: Never   Tobacco comments:    0.5PPD 06/20/22 wearing patches and taking chantix and will quit for a while.  hfb  Substance Use Topics   Alcohol use: No    Comment: History of 6 pk beer on weekends- Last [facility] 11/2015   No Known Allergies  Current Meds  Medication Sig   albuterol (VENTOLIN HFA) 108 (90 Base) MCG/ACT inhaler Inhale 2 puffs into the lungs every 6 (six) hours as needed for wheezing or shortness of breath.   Budeson-Glycopyrrol-Formoterol (BREZTRI AEROSPHERE) 160-9-4.8 MCG/ACT AERO Inhale 2 puffs into the lungs in the morning and at bedtime.   HYDROcodone-acetaminophen (NORCO/VICODIN) 5-325 MG tablet Take one tablet at night for pain; may take up to every 6 hours as needed for pain if not working or driving   ketorolac (TORADOL) 10 MG tablet Take 1 tablet (10 mg total) by mouth every 6 (six) hours as needed.   lidocaine (LIDODERM) 5 % Place 1 patch onto the skin daily. Remove & Discard patch within 12 hours.   nicotine (NICODERM CQ) 21 mg/24hr patch Place 1 patch (21 mg total) onto the skin daily.   varenicline (CHANTIX PAK) 0.5 MG X 11 & 1 MG X 42 tablet Take one 0.5 mg tablet by mouth once daily for 3 days, then increase to one 0.5 mg tablet twice daily for 4 days, then increase to one 1 mg tablet twice daily.   [DISCONTINUED] naproxen (NAPROSYN) 500 MG tablet Take by mouth.   Immunization History  Administered Date(s) Administered   Influenza,inj,Quad PF,6+ Mos 09/10/2013   PFIZER Comirnaty(Gray  Top)Covid-19 Tri-Sucrose Vaccine 02/06/2020, 02/27/2020   PNEUMOCOCCAL CONJUGATE-20 01/09/2021   Pneumococcal Polysaccharide-23 05/27/2012   Tdap 05/27/2012   Zoster Recombinat (Shingrix) 01/09/2021, 04/17/2021      Objective:   Physical Exam BP 124/70 (BP Location: Left Arm, Patient Position: Sitting, Cuff Size: Normal)   Pulse 78   Temp 98.2 F (36.8 C) (Oral)   Ht '5\' 10"'$  (1.778 m)   Wt 152 lb 6.4 oz (69.1 kg)   SpO2 97%   BMI 21.87 kg/m  GENERAL: Thin well-developed gentleman in no acute distress, fully ambulatory.  No conversational dyspnea.  Appears uncomfortable and exhibits ill temperature today.   HEAD: Normocephalic, atraumatic. EYES: Pupils equal, round, reactive to light.  No scleral icterus. MOUTH: Poor dentition, oral mucosa moist. NECK: Supple. No thyromegaly. Trachea midline. No JVD.  No adenopathy. PULMONARY: Good air entry bilaterally.  Coarse, no other adventitious sounds.  CARDIOVASCULAR: S1 and S2. Regular rate and rhythm.  No rubs, murmurs or  gallops heard. ABDOMEN: Benign. MUSCULOSKELETAL: No joint deformity, no clubbing, no edema. NEUROLOGIC: No focal deficit, no gait disturbance, speech is fluent. SKIN: Intact,warm,dry.  On limited exam, no rashes. PSYCH: Mood and behavior normal     Assessment & Plan:     ICD-10-CM   1. Rib pain  R07.81    Eighth rib fracture appears to be healing Patient has been referred to sports medicine by primary care Trial of Toradol p.o. for pain Discontinue Naprosyn    2. COPD, moderate (HCC)  J44.9    Continue Breztri 2 puffs twice a day Continue as needed albuterol    3. Tobacco dependence due to cigarettes  F17.210    Patient counseled regards to discontinuation of smoking     Meds ordered this encounter  Medications   ketorolac (TORADOL) 10 MG tablet    Sig: Take 1 tablet (10 mg total) by mouth every 6 (six) hours as needed.    Dispense:  20 tablet    Refill:  0   We will see the patient in follow-up in 4  to 6 weeks time he is to contact us prior to that time should any new difficulties arise.  Renold Don, MD Advanced Bronchoscopy PCCM [facility]    *This note was dictated using voice recognition software/Dragon.  Despite best efforts to proofread, errors can occur which can change the meaning. Any transcriptional errors that result from this process are unintentional and may not be fully corrected at the time of dictation.

## 2022-06-20 NOTE — Patient Instructions (Signed)
We are changing your anti-inflammatory to ketorolac (Toradol) you can take this 1 tablet up to 4 times a day if needed however you cannot take this more than 5 days due to effects on the kidney.  Make sure you drink plenty of water when you take it.  Do not take the Naprosyn while taking the ketorolac.  Keep your appointment with sports medicine.  We will see you in follow-up in 4 to 6 weeks time call sooner should any new problems arise.

## 2022-06-21 ENCOUNTER — Encounter: Payer: Self-pay | Admitting: Pulmonary Disease

## 2022-06-24 NOTE — Progress Notes (Unsigned)
I, Paul Fields, LAT, ATC acting as a scribe for Paul Leader, MD.  Subjective:    CC: Rib pain  HPI: Pt is a 61 y/o male c/o rib pain. Pt is L-hand dominate. Of note pt is a current smoker, COPD, w/ a nodule on his L lung, upper lobe. Pt was seen at Pulmonology on 8/24 c/o L-sided chest/rib pain w/o specific trauma, but does recall hearing a "pop" in his L chest 1 week ago when turning to reach for something. Pt had L lung surgery in May 2023. Pt locates pain to the L-side of the posterior aspect of the rib cage and wrap around to the anterior rib cage. 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  Usually approximates that he has to throw a heavy steel chain about 100 times during his day with his left arm which is very challenging with his current rib and flank pain.  Aggravates: everything Treatments tried: naproxen, toradol  Dx imaging: 04/24/22 Ribs w/ Chest L XR  Pertinent review of Systems: No fevers or chills  Relevant historical information: Emphysema.  History of a left rib fracture eighth rib June 2023.   Objective:    Vitals:   06/25/22 0825  BP: 134/84  Pulse: 87  SpO2: 97%   General: Well Developed, well nourished, and in no acute distress.   MSK: Left flank/rib: Tender palpation left lateral posterior to anterior ribs. Lungs: Breath sounds present bilaterally.  Some crackles present left lung field.  Lab and Radiology Results  X-ray images chest and left ribs obtained today personally and independently interpreted. Nondisplaced seventh rib fracture just superior to an 8th rib fracture with callus formation. This is best seen on the oblique film. No pneumothorax or pleural effusion. Await formal radiology review    Impression and Recommendations:    Assessment and Plan: 61 y.o.  male with new left rib fracture in the setting of a 42-monthold rib fracture.  Plan for rib binder.  Consider physical therapy.  Out of work for at least 3 weeks.  We will may update that work returns status based on how he feels.  Tylenol and ibuprofen for pain control.  We talked about opiates and decided against it for now.  Check back in 1 month..[place] PDMP not reviewed this encounter. Orders Placed This Encounter  Procedures   DG Ribs Unilateral W/Chest Left    Standing Status:   Future    Number of Occurrences:   1    Standing Expiration Date:   07/26/2022    Order Specific Question:   Reason for Exam (SYMPTOM  OR DIAGNOSIS REQUIRED)    Answer:   rib pain    Order Specific Question:   Preferred imaging location?    Answer:   [facility]  Ambulatory referral to Physical Therapy    Referral Priority:   Routine    Referral Type:   Physical Medicine    Referral Reason:   Specialty Services Required    Requested Specialty:   Physical Therapy    Number of Visits Requested:   1   No orders of the defined types were placed in this encounter.   Discussed warning signs or symptoms. Please see discharge instructions. Patient expresses understanding.   The above documentation has been  reviewed and is accurate and complete Paul Fields, M.D.

## 2022-06-25 ENCOUNTER — Ambulatory Visit: Payer: BC Managed Care – PPO | Admitting: Family Medicine

## 2022-06-25 ENCOUNTER — Ambulatory Visit (INDEPENDENT_AMBULATORY_CARE_PROVIDER_SITE_OTHER): Payer: BC Managed Care – PPO

## 2022-06-25 VITALS — BP 134/84 | HR 87 | Ht 70.0 in | Wt 150.8 lb

## 2022-06-25 DIAGNOSIS — R0781 Pleurodynia: Secondary | ICD-10-CM

## 2022-06-25 DIAGNOSIS — S2232XA Fracture of one rib, left side, initial encounter for closed fracture: Secondary | ICD-10-CM

## 2022-06-25 NOTE — Patient Instructions (Addendum)
Thank you for coming in today.   Please get an Xray today before you leave   Recheck in 1 month.   Use a rib binder.   Try heat.   I've referred you to Physical Therapy.  Let us know if you don't hear from them in one week.

## 2022-06-26 NOTE — Progress Notes (Signed)
Rib x-ray shows a new left seventh rib fracture.  You do have a healing eighth rib fracture but it looks like a new seventh rib fracture on the left which is probably the source of your pain.  Lets recheck in 1 month but if you cannot return to work as arranged in 3 weeks let me know.

## 2022-06-27 ENCOUNTER — Encounter: Payer: Self-pay | Admitting: Family Medicine

## 2022-06-28 MED ORDER — HYDROCODONE-ACETAMINOPHEN 5-325 MG PO TABS: 1.0000 | ORAL_TABLET | Freq: Four times a day (QID) | ORAL | 0 refills | Status: DC | PRN

## 2022-07-02 ENCOUNTER — Telehealth: Payer: Self-pay

## 2022-07-02 NOTE — Telephone Encounter (Signed)
Patient states he saw Mable Paris, FNP, on 06/18/2022 for his ribs.  Patient states he is following-up on some short term disability paperwork that [facility] faxed to [place].  Patient states they have not received the completed form back yet.

## 2022-07-02 NOTE — Telephone Encounter (Signed)
Spoke to patient to inquire about paperwork from the [place] . Patient stated that they he stated just sent it out today on9/5/23

## 2022-07-02 NOTE — Telephone Encounter (Signed)
Patient states he has the short term disability form from [employer] [facility] that he would like for Mable Paris, FNP, to complete for him.  Patient asked if he can email it to [place].  I gave patient our email address:  [email or web address].

## 2022-07-03 NOTE — Telephone Encounter (Signed)
We finally have the form and I have given it to [facility].

## 2022-07-03 NOTE — Telephone Encounter (Signed)
Patient states he is calling to see if we have received his short-term disability paperwork.  Patient states [employer] [facility] told him that they faxed it yesterday and patient states he emailed it to our generic email address.  I let patient know that we have checked all of the faxes we have received and our generic email address, and we have not received it.  I asked patient to please contact [employer] and have them fax it to [place] at [phone].

## 2022-07-04 NOTE — Telephone Encounter (Signed)
Pt called and want to know when the second part is faxed to [facility]

## 2022-07-05 NOTE — Telephone Encounter (Signed)
Called patient and advised him that we had gotten his paperwork from [facility] and had faxed it back to them on 07/05/22?

## 2022-07-15 NOTE — Telephone Encounter (Signed)
Last note with M. Arnette was faxed to [facility] and confirmation given.  Savas Elvin,cma

## 2022-07-15 NOTE — Telephone Encounter (Signed)
Pt called stating [facility] called him stating they need doctor notes

## 2022-07-16 NOTE — Telephone Encounter (Signed)
Fax sent.

## 2022-07-25 NOTE — Progress Notes (Signed)
   I, Peterson Lombard, LAT, ATC acting as a scribe for Lynne Leader, MD.  Paul Fields is a 61 y.o. male who presents to [place] at [facility] today for f/u L-sided rib fx. 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 Pt had L lung surgery in May 2023. Pt was last seen by Dr. Georgina Snell on 06/25/22 and was advised to use Tylenol or IBU for pain control and wear a rib binder. Today, pt reports he is feeling a good bit better. Pt note he will just get a "pulling" sensation more on the L-anterior aspect of his rib cage.  Overall he is feeling a lot better.  He is eager to return to work on this upcoming Monday, October 2.  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  Dx imaging: 06/25/22 L Ribs w/ chest XR 04/24/22 L Ribs w/ Chest XR  Pertinent review of systems: No fevers or chills  Relevant historical information: Emphysema   Exam:  BP 128/78   Pulse 77   Ht '5\' 10"'$  (1.778 m)   Wt 153 lb (69.4 kg)   SpO2 96%   BMI 21.95 kg/m  General: Well Developed, well nourished, and in no acute distress.   MSK: Left rib normal-appearing Mildly tender to palpation. Normal thoracic motion.  Upper extremity strength is intact.  Upper extremity motion is intact.    Lab and Radiology Results DG Ribs Unilateral W/Chest Left  Result Date: 06/25/2022 CLINICAL DATA:  Possible trauma, pain EXAM: LEFT RIBS AND CHEST - 3+ VIEW COMPARISON:  Previous studies including the examination of 04/24/2022. FINDINGS: There is new there is cortical irregularity in the posterolateral aspect of left seventh rib consistent with recent fracture. Deformity in the posterolateral aspect of left eighth rib would be consistent with healed or healing fracture. Lung fields are clear of any infiltrates or pulmonary edema. There is no pleural  effusion or pneumothorax. Surgical staples are seen in left upper lung field. There small scattered calcific densities, possibly granulomas in both lungs. IMPRESSION: There is recent undisplaced fracture in the posterolateral aspect of left seventh rib. There is no evidence of pulmonary contusion or pleural effusion or pneumothorax. Electronically Signed   By: Elmer Picker M.D.   On: 06/25/2022 16:07   I, Lynne Leader, personally (independently) visualized and performed the interpretation of the images attached in this note.     Assessment and Plan: 61 y.o. male with left rib fracture.  Patient had an acute left rib fracture when last seen a month ago.  Clinically he has improved quite a bit.  He is not completely asymptomatic at this time which is not a surprise but he is probably well enough to attempt to return to work.  I think you will be successful at his return to work but it is possible he will be unable to return to work as predicted on Monday, October 2 and will have to pull him out of work and fill out disability paperwork again.  Discussed precautions.  Check back as needed.    Discussed warning signs or symptoms. Please see discharge instructions. Patient expresses understanding.   The above documentation has been reviewed and is accurate and complete Lynne Leader, M.D.

## 2022-07-26 ENCOUNTER — Ambulatory Visit (INDEPENDENT_AMBULATORY_CARE_PROVIDER_SITE_OTHER): Payer: BC Managed Care – PPO | Admitting: Family Medicine

## 2022-07-26 VITALS — BP 128/78 | HR 77 | Ht 70.0 in | Wt 153.0 lb

## 2022-07-26 DIAGNOSIS — S2232XD Fracture of one rib, left side, subsequent encounter for fracture with routine healing: Secondary | ICD-10-CM | POA: Diagnosis not present

## 2022-07-26 NOTE — Patient Instructions (Signed)
Thank you for coming in today.   Check back as needed

## 2022-07-30 ENCOUNTER — Ambulatory Visit: Payer: BC Managed Care – PPO | Admitting: Pulmonary Disease

## 2022-08-20 ENCOUNTER — Encounter: Payer: Self-pay | Admitting: Pulmonary Disease

## 2022-08-20 ENCOUNTER — Ambulatory Visit (INDEPENDENT_AMBULATORY_CARE_PROVIDER_SITE_OTHER): Payer: BC Managed Care – PPO | Admitting: Pulmonary Disease

## 2022-08-20 VITALS — BP 110/70 | HR 64 | Temp 97.8°F | Ht 70.0 in | Wt 153.6 lb

## 2022-08-20 DIAGNOSIS — H6692 Otitis media, unspecified, left ear: Secondary | ICD-10-CM | POA: Diagnosis not present

## 2022-08-20 DIAGNOSIS — F17210 Nicotine dependence, cigarettes, uncomplicated: Secondary | ICD-10-CM | POA: Diagnosis not present

## 2022-08-20 DIAGNOSIS — J4489 Other specified chronic obstructive pulmonary disease: Secondary | ICD-10-CM

## 2022-08-20 DIAGNOSIS — J8410 Pulmonary fibrosis, unspecified: Secondary | ICD-10-CM

## 2022-08-20 MED ORDER — CIPROFLOXACIN-DEXAMETHASONE 0.3-0.1 % OT SUSP: 4.0000 [drp] | Freq: Two times a day (BID) | OTIC | 0 refills | Status: DC

## 2022-08-20 MED ORDER — AMOXICILLIN-POT CLAVULANATE 875-125 MG PO TABS: 1.0000 | ORAL_TABLET | Freq: Two times a day (BID) | ORAL | 0 refills | Status: DC

## 2022-08-20 MED ORDER — ALBUTEROL SULFATE HFA 108 (90 BASE) MCG/ACT IN AERS: 2.0000 | INHALATION_SPRAY | Freq: Four times a day (QID) | RESPIRATORY_TRACT | 0 refills | Status: DC | PRN

## 2022-08-20 NOTE — Progress Notes (Signed)
Subjective:    Patient ID: Paul Fields, male    DOB: Aug 20, 1961, 61 y.o.   MRN: [id number] Patient Care Team: Glori Luis, MD as PCP - General [facility])  Chief Complaint  Patient presents with   Follow-up    COPD. SOB when working. Normal wheezing. Not much cough.   HPI Patient is a 61 year old current smoker (half PPD, 67.5 PY) who presents for follow-up on the issue of COPD and a left upper lobe lung nodule.  Patient was last seen on 20 June 2022.  Recall he had robotic bronchoscopy on 20 April and robotic assisted left upper lobe wedge resection on 18 May May 23.  Patient had a necrotizing granuloma which was causing his abnormalities noted on chest CT and PET/CT. Fungal cultures and AFB cultures have been negative.  The patient at that time appeared to be recovering well from his resection with some minimal residual pain.  At his prior visit however he had had a fracture of his eighth rib and he was in a great deal of pain.  This however resolved eventually and he is now back to baseline.  He notes that he has had some shortness of breath at his work in [employer].  Does note some wheezing occasionally.  He has not had any cough of note.  He unfortunately continues to smoke.  Over the last 2 to 3 days he has noted fullness in his left ear and it has become painful.  He has also noted some drainage coming from the ear.  No fevers, chills or sweats.  He is compliant with Breztri 2 puffs twice a day.  Uses albuterol as rescue at least twice a day.  He has not had any chest pain since his last visit.  No orthopnea or paroxysmal nocturnal dyspnea.  No lower extremity edema nor calf tenderness.  Does not endorse any other symptomatology.  He is to resume lung cancer screening.  DATA 02/03/2018 PFTs: FEV1 2.10 L or 58% predicted, FVC 3.40 L or 75% predicted, FEV1/FVC 62%, significant bronchodilator response both in FEV1 and reduced air trapping.  Lung volumes show significant  air trapping at 158% fusion capacity normal. 05/05/2018 PFTs: FEV1 2.59 L or 71% predicted, FVC 3.98 L or 88% predicted, FEV1/FVC 65%, no significant bronchodilator response, diffusion capacity normal.  Hyperinflation and air trapping noted. 01/17/2021 CT chest: Calcified mediastinal nodes, 6 mm posterior medial right upper lobe groundglass nodule and 0.6 mm nodule in the right upper lobe. 04/17/2021 PFTs: FEV1 2.50 L or 75% predicted, FVC 4.01 L or 91% predicted, FEV1/FVC 62%. Hyperinflation and air trapping noted.  All airways component with bronchodilator response.  Diffusion capacity mildly reduced. 08/31/2021 CT chest: Interval development of a 7 mm irregular left upper lobe pulmonary nodule noncontrasted CT 3 to 6 months is recommended other pulmonary nodules stable.  Patient scheduled for a 3 month follow-up. 01/04/2022 CT chest: Interval enlargement of the previously noted left upper lobe pulmonary nodule measuring 1.2 x 0.5 cm. 01/22/2022 PET/CT: Hypermetabolic nodule in the left upper lobe concerning for primary bronchogenic carcinoma.  No evidence of mediastinal nodal metastasis.  No distant metastatic disease.  Scattered pulmonary nodules and calcified small mediastinal lymph nodes consistent with prior granulomatous disease. 03/14/2022 surgical pathology (wedge resection): Necrotizing granuloma.  Cultures negative.  Review of Systems A 10 point review of systems was performed and it is as noted above otherwise negative.  Patient Active Problem List   Diagnosis Date Noted  Pneumothorax after biopsy 02/14/2022    Priority: 1.   Nodule of upper lobe of left lung 04/04/2016    Priority: 2.   Centrilobular emphysema (HCC) 01/14/2018    Priority: 3.   Nicotine dependence, cigarettes, uncomplicated 05/27/2012    Priority: 4.   Left rib fracture 04/24/2022   S/P robot-assisted surgical procedure 03/14/2022   Vertigo 05/11/2021   Fixed pupil of left eye 01/09/2021   GERD  (gastroesophageal reflux disease) 04/24/2018   Personal history of tobacco use, presenting hazards to health 03/14/2016   Hx of adenomatous colonic polyps 08/11/2012   Social History   Tobacco Use   Smoking status: Every Day    Packs/day: 3.00    Years: 45.00    Additional pack years: 0.00    Total pack years: 135.00    Types: Cigarettes   Smokeless tobacco: Never   Tobacco comments:    0.5PPD 06/20/22 wearing patches and taking chantix and will quit for a while.  Hfb    5-7 cigarettes a day. Khj  08/20/2022  Substance Use Topics   Alcohol use: No    Comment: History of 6 pk beer on weekends- Last [facility] 11/2015   No Known Allergies  Current Meds  Medication Sig   albuterol (VENTOLIN HFA) 108 (90 Base) MCG/ACT inhaler Inhale 2 puffs into the lungs every 6 (six) hours as needed for wheezing or shortness of breath.   Budeson-Glycopyrrol-Formoterol (BREZTRI AEROSPHERE) 160-9-4.8 MCG/ACT AERO Inhale 2 puffs into the lungs in the morning and at bedtime.   nicotine (NICODERM CQ) 21 mg/24hr patch Place 1 patch (21 mg total) onto the skin daily.   Immunization History  Administered Date(s) Administered   Influenza,inj,Quad PF,6+ Mos 09/10/2013   PFIZER Comirnaty(Gray Top)Covid-19 Tri-Sucrose Vaccine 02/06/2020, 02/27/2020   PNEUMOCOCCAL CONJUGATE-20 01/09/2021   Pneumococcal Polysaccharide-23 05/27/2012   Tdap 05/27/2012   Zoster Recombinat (Shingrix) 01/09/2021, 04/17/2021       Objective:   Physical Exam BP 110/70 (BP Location: Left Arm, Cuff Size: Normal)   Pulse 64   Temp 97.8 F (36.6 C)   Ht 5\' 10"  (1.778 m)   Wt 153 lb 9.6 oz (69.7 kg)   SpO2 96%   BMI 22.04 kg/m   SpO2: 96 % O2 Device: None (Room air)  GENERAL: Thin well-developed gentleman in no acute distress, fully ambulatory.  No conversational dyspnea.   HEAD: Normocephalic, atraumatic. EARS: Left otitis media and externa, Ruman impaction on right. EYES: Pupils equal, round, reactive to light.  No scleral  icterus. MOUTH: Poor dentition, oral mucosa moist. NECK: Supple. No thyromegaly. Trachea midline. No JVD.  No adenopathy. PULMONARY: Good air entry bilaterally.  Coarse, no other adventitious sounds.  CARDIOVASCULAR: S1 and S2. Regular rate and rhythm.  No rubs, murmurs or gallops heard. ABDOMEN: Benign. MUSCULOSKELETAL: No joint deformity, no clubbing, no edema. NEUROLOGIC: No focal deficit, no gait disturbance, speech is fluent. SKIN: Intact,warm,dry.  On limited exam, no rashes. PSYCH: Mood and behavior normal.    Assessment & Plan:     ICD-10-CM   1. COPD with asthma  J44.89 albuterol (VENTOLIN HFA) 108 (90 Base) MCG/ACT inhaler   New Breztri and as needed albuterol Medications refilled today    2. Otitis of left ear  H66.92    Both otitis media and externa Augmentin 7-day course Ciprodex Otic Follow-up with primary care    3. Pulmonary granuloma (HCC)  J84.10    Status post wedge resection Resume lung cancer screening program    4. Tobacco  dependence due to cigarettes  F17.210    Patient counseled regards to discontinuation of smoking Total counseling time 3 to 5 minutes     Meds ordered this encounter  Medications   albuterol (VENTOLIN HFA) 108 (90 Base) MCG/ACT inhaler    Sig: Inhale 2 puffs into the lungs every 6 (six) hours as needed for wheezing or shortness of breath.    Dispense:  8 g    Refill:  0   ciprofloxacin-dexamethasone (CIPRODEX) OTIC suspension    Sig: Place 4 drops into the left ear 2 (two) times daily.    Dispense:  7.5 mL    Refill:  0   amoxicillin-clavulanate (AUGMENTIN) 875-125 MG tablet    Sig: Take 1 tablet by mouth 2 (two) times daily.    Dispense:  14 tablet    Refill:  0   Will see the patient in follow-up in 3 months time he is to call sooner should any new problems arise.  He has been advised to follow-up with primary care with regards to his findings of otitis on the left ear.  Gailen Shelter, MD Advanced Bronchoscopy PCCM  [facility]    *This note was dictated using voice recognition software/Dragon.  Despite best efforts to proofread, errors can occur which can change the meaning. Any transcriptional errors that result from this process are unintentional and may not be fully corrected at the time of dictation.

## 2022-08-20 NOTE — Patient Instructions (Addendum)
Refilled your albuterol.  Our records indicate that you still have enough refills [facility] the [facility].  We have sent prescriptions for your ear infection to the pharmacy  We will see you in follow-up in 3 months time call sooner should any new problems arise.

## 2023-02-17 ENCOUNTER — Ambulatory Visit
Admission: RE | Admit: 2023-02-17 | Discharge: 2023-02-17 | Disposition: A | Discharge status: Home / Self Care | Payer: BC Managed Care – PPO | Source: Ambulatory Visit | Attending: Family Medicine | Admitting: Family Medicine

## 2023-02-17 DIAGNOSIS — Z122 Encounter for screening for malignant neoplasm of respiratory organs: Secondary | ICD-10-CM | POA: Diagnosis not present

## 2023-02-17 DIAGNOSIS — F17210 Nicotine dependence, cigarettes, uncomplicated: Secondary | ICD-10-CM | POA: Diagnosis present

## 2023-02-17 DIAGNOSIS — Z87891 Personal history of nicotine dependence: Secondary | ICD-10-CM | POA: Diagnosis present

## 2023-02-26 ENCOUNTER — Other Ambulatory Visit: Payer: Self-pay | Admitting: Acute Care

## 2023-02-26 ENCOUNTER — Encounter: Payer: Self-pay | Admitting: Pulmonary Disease

## 2023-02-26 DIAGNOSIS — Z87891 Personal history of nicotine dependence: Secondary | ICD-10-CM

## 2023-02-26 DIAGNOSIS — F17210 Nicotine dependence, cigarettes, uncomplicated: Secondary | ICD-10-CM

## 2023-02-26 DIAGNOSIS — Z122 Encounter for screening for malignant neoplasm of respiratory organs: Secondary | ICD-10-CM

## 2023-05-24 IMAGING — MR MR HEAD WO/W CM
16 series · 48 of 48 positions shown · IV contrast (gadavist)
Comparison: CT angiogram head/neck 09/02/2020

CLINICAL DATA: Vertigo R42 ([id number]). Dizziness,
persistent/recurrent, cardiac or vascular cause suspected.
Additional history provided: Patient reports dizziness when going
from laying to sitting or sitting to laying for 3 months. Patient
reports he has to wait for dizziness to pass before he can stand and
walk.

EXAM:
MRI HEAD WITHOUT AND WITH CONTRAST
TECHNIQUE: Multiplanar, multiecho pulse sequences of the brain and surrounding
structures were obtained without and with intravenous contrast.
CONTRAST:  7mL GADAVIST GADOBUTROL 1 MMOL/ML IV SOLN

[Series 5: ax dwi_tracew · axial · 3.0mm · 0.65mm/px · z∈[-71,+84]mm · 2 of 48 slices shown]
[im 1/48]
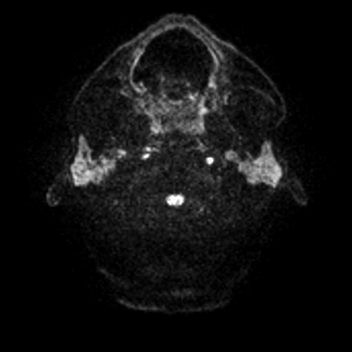
[im 48/48]
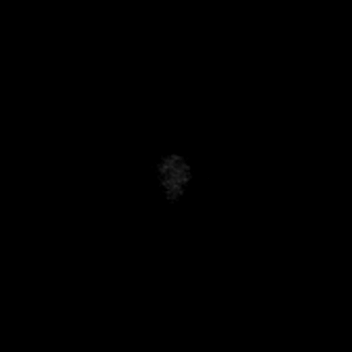

[Series 7: cor dwi_tracew · coronal · 5.0mm · 0.65mm/px · 2 of 40 slices shown]
[im 1/40]
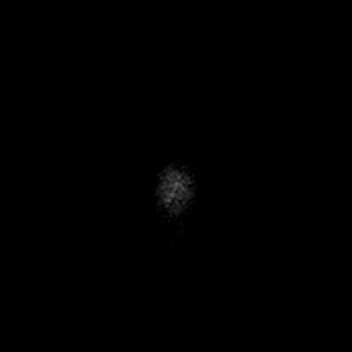
[im 40/40]
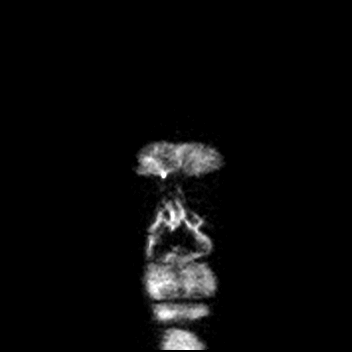

[Series 8: cor dwi_adc · coronal · 5.0mm · 0.65mm/px · 2 of 39 slices shown]
[im 1/39]
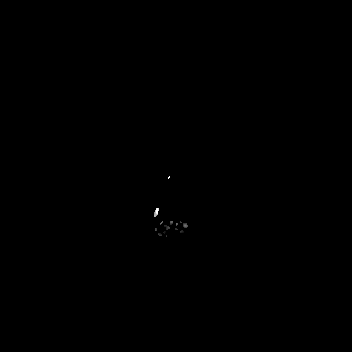
[im 39/39]
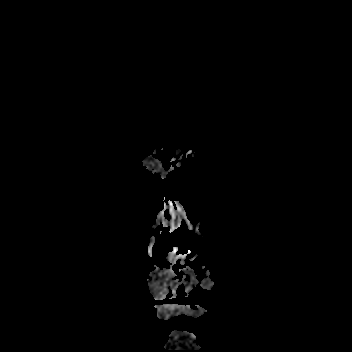

[Series 9: T1 · sagittal · 5.0mm · 0.62mm/px · 1 of 25 slices shown (1 of 2)]
[im 1/25]
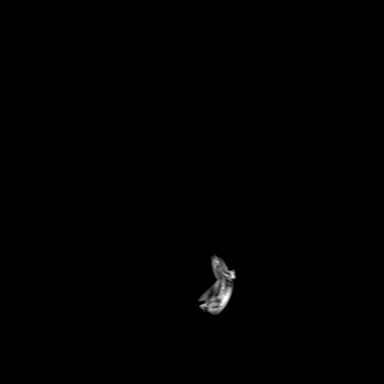

[Series 10: T2 · axial · 5.0mm · 0.53mm/px · 1 of 25 slices shown]
[im 1/25]
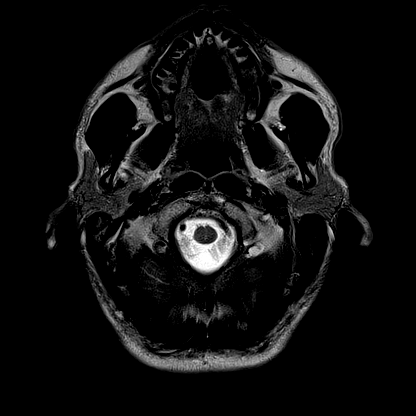

[Series 11: mag_images · axial · 3.0mm · 0.90mm/px · z∈[-81,+95]mm · 3 of 60 slices shown]
[im 1/60]
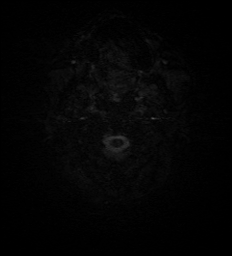
[im 30/60]
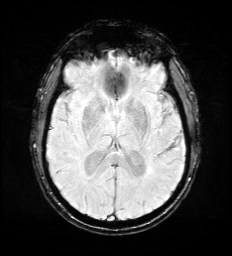
[im 60/60]
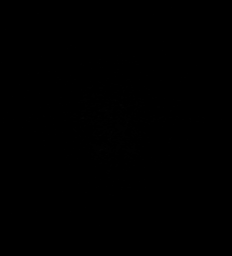

[Series 12: pha_images · axial · 3.0mm · 0.90mm/px · z∈[-81,+95]mm · 3 of 59 slices shown]
[im 1/59]
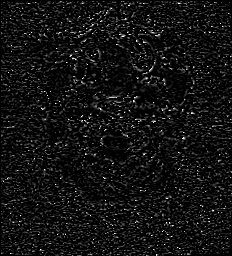
[im 30/59]
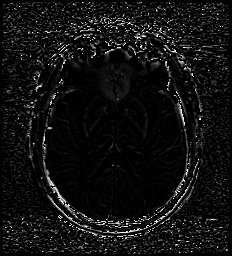
[im 59/59]
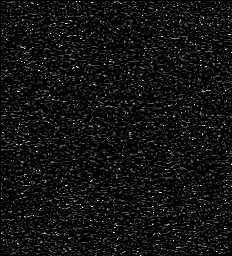

[Series 13: swi_images · axial · 3.0mm · 0.90mm/px · z∈[-81,+95]mm · 3 of 60 slices shown]
[im 1/60]
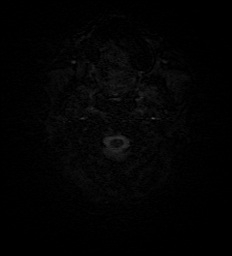
[im 30/60]
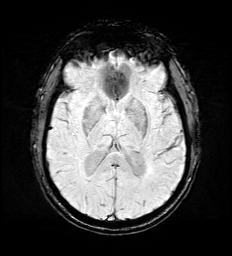
[im 60/60]
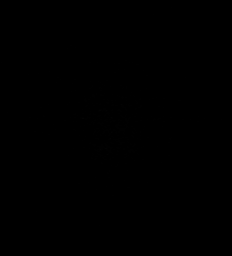

[Series 14: mip_images(sw) · axial · 24.0mm · 0.90mm/px · z∈[-71,+85]mm · 3 of 53 slices shown]
[im 1/53]
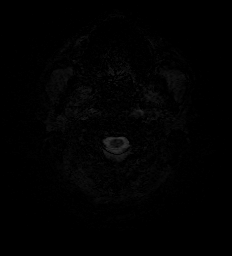
[im 27/53]
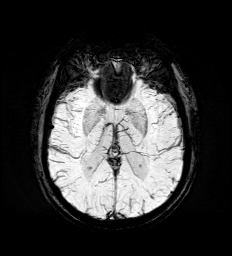
[im 53/53]
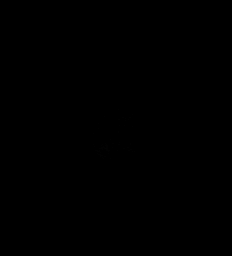

[Series 15: FLAIR · axial · 3.0mm · 0.53mm/px · z∈[-74,+88]mm · 3 of 55 slices shown]
[im 1/55]
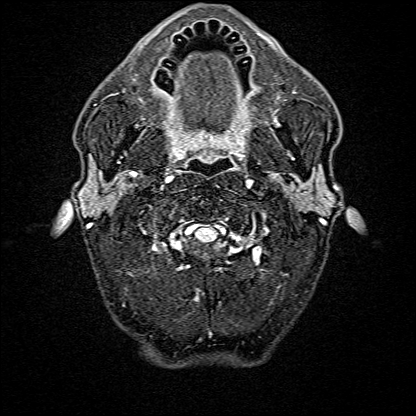
[im 28/55]
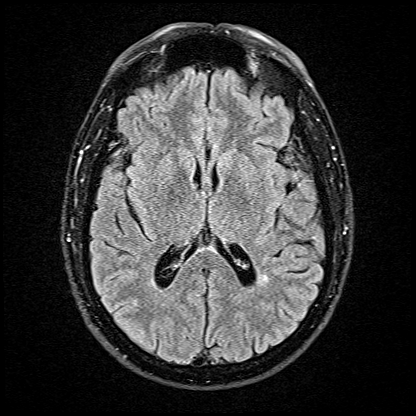
[im 55/55]
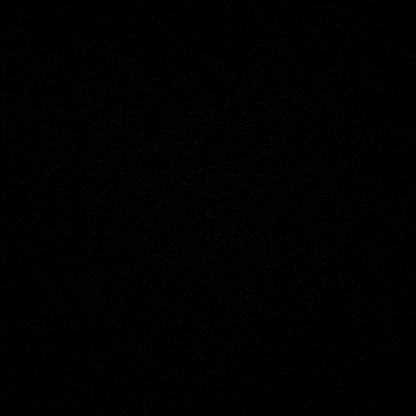

[Series 16: T1 · axial · 1.0mm · 0.98mm/px · z∈[-80,+95]mm · 9 of 176 slices shown (2 of 2)]
[im 1/176]
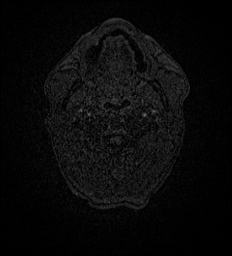
[im 22/176]
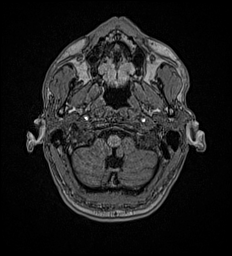
[im 44/176]
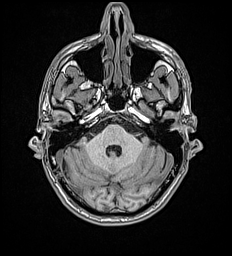
[im 66/176]
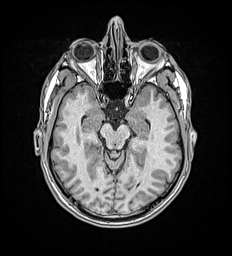
[im 88/176]
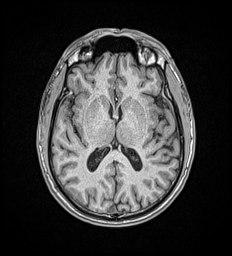
[im 110/176]
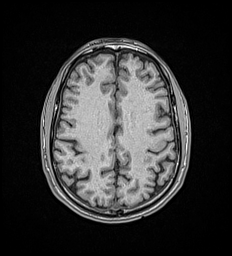
[im 132/176]
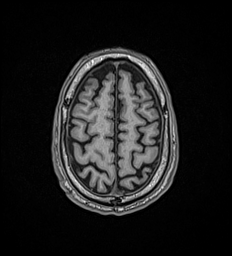
[im 154/176]
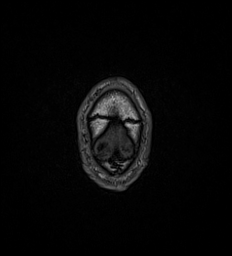
[im 176/176]
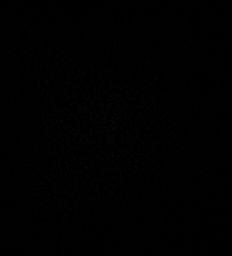

[Series 17: T2 post-contrast · coronal · 5.0mm · 0.57mm/px · 2 of 29 slices shown (1 of 2)]
[im 1/29]
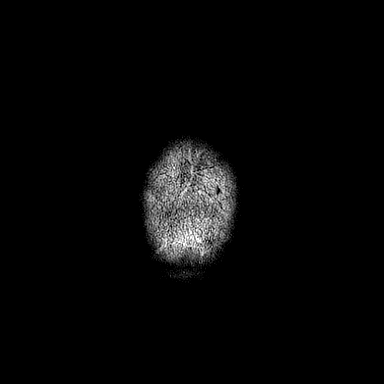
[im 29/29]
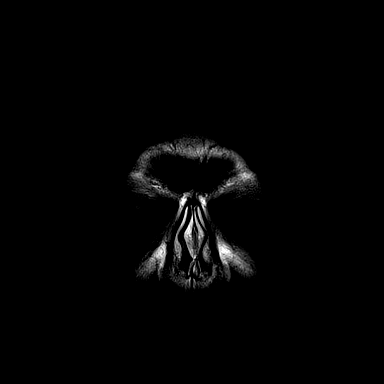

[Series 18: T1 post-contrast · axial · 1.0mm · 0.98mm/px · z∈[-80,+94]mm · 9 of 174 slices shown (1 of 3)]
[im 1/174]
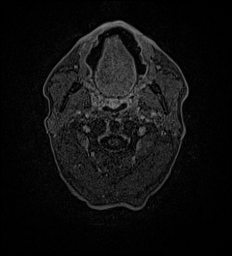
[im 22/174]
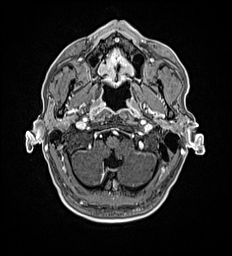
[im 44/174]
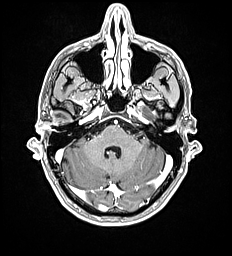
[im 65/174]
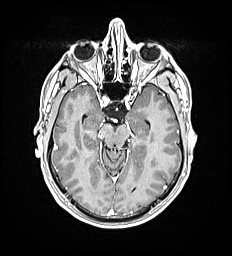
[im 87/174]
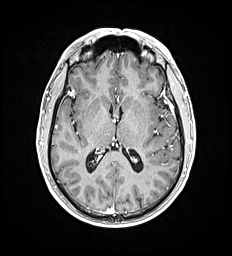
[im 109/174]
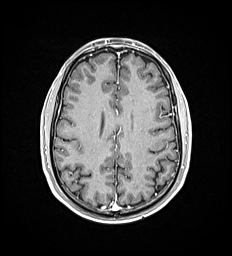
[im 130/174]
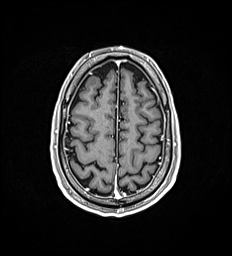
[im 152/174]
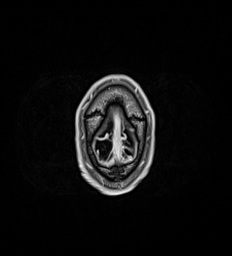
[im 174/174]
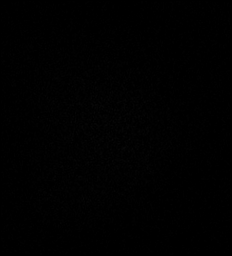

[Series 19: T1 post-contrast · coronal · 5.0mm · 0.57mm/px · 2 of 29 slices shown (2 of 3)]
[im 1/29]
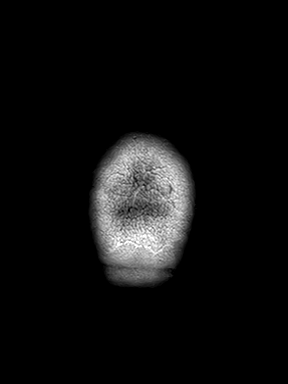
[im 29/29]
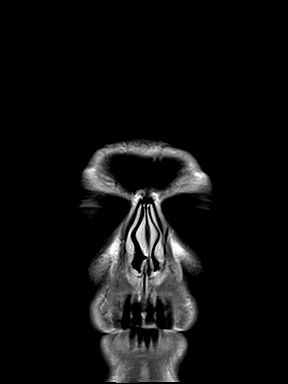

[Series 20: T1 post-contrast · sagittal · 5.0mm · 0.62mm/px · 1 of 25 slices shown (3 of 3)]
[im 1/25]
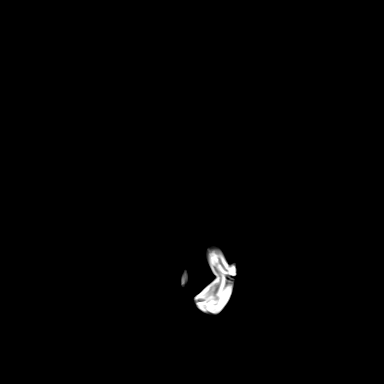

[Series 21: T2 post-contrast · coronal · 5.0mm · 0.57mm/px · 2 of 29 slices shown (2 of 2)]
[im 1/29]
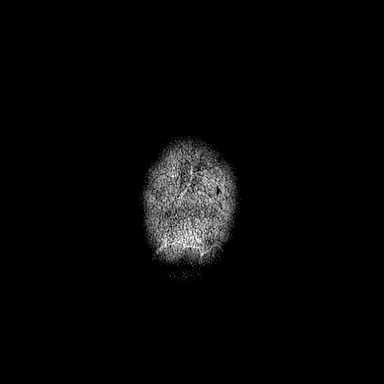
[im 29/29]
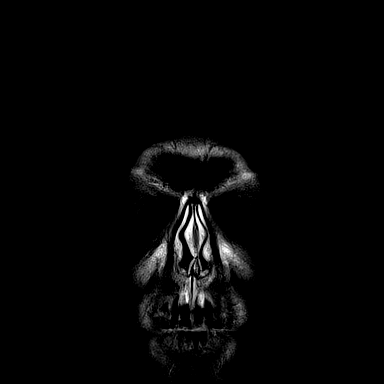

[48 of 48 positions shown; findings below may reference images not displayed]

FINDINGS: Brain:

Cerebral volume is normal for age.

A few small scattered foci of T2/FLAIR hyperintensity within the
cerebral white matter are nonspecific, but compatible with minimal
changes of chronic small vessel ischemia.

There is no acute infarct.

No evidence of an intracranial mass.

No chronic intracranial blood products.

No extra-axial fluid collection.

No midline shift.

No abnormal intracranial enhancement.

Vascular: Expected proximal arterial flow voids.

Skull and upper cervical spine: No focal suspicious marrow lesion.

Sinuses/Orbits: Visualized orbits show no acute finding. No
significant paranasal sinus disease.
IMPRESSION: No evidence of acute intracranial abnormality.

Minimal chronic small-vessel ischemic changes within the cerebral
white matter.

Otherwise unremarkable MRI appearance of the brain.

## 2023-07-02 ENCOUNTER — Telehealth: Payer: Self-pay | Admitting: Pulmonary Disease

## 2023-07-02 DIAGNOSIS — J4489 Other specified chronic obstructive pulmonary disease: Secondary | ICD-10-CM

## 2023-07-02 MED ORDER — ALBUTEROL SULFATE HFA 108 (90 BASE) MCG/ACT IN AERS: 2.0000 | INHALATION_SPRAY | Freq: Four times a day (QID) | RESPIRATORY_TRACT | 2 refills | Status: DC | PRN

## 2023-07-02 MED ORDER — BREZTRI AEROSPHERE 160-9-4.8 MCG/ACT IN AERO: 2.0000 | INHALATION_SPRAY | Freq: Two times a day (BID) | RESPIRATORY_TRACT | 3 refills | Status: DC

## 2023-07-02 NOTE — Telephone Encounter (Signed)
I have sent in refills on both medications. Enough to last him until his appt in Dec. Lm x1 for the patient.

## 2023-07-02 NOTE — Telephone Encounter (Signed)
Patient states needs refill for Albuterol inhaler and Breztri inhaler. Pharmacy is [facility] [address]. Patient scheduled 10/08/2023 with Dr. Jayme Cloud. Patient phone number is [phone].

## 2023-07-02 NOTE — Telephone Encounter (Signed)
Informed patient refills have been sent to pharmacy.

## 2023-07-14 ENCOUNTER — Ambulatory Visit: Payer: BC Managed Care – PPO | Admitting: Pulmonary Disease

## 2023-07-15 ENCOUNTER — Other Ambulatory Visit: Payer: Self-pay

## 2023-07-15 MED ORDER — LEVALBUTEROL TARTRATE 45 MCG/ACT IN AERO: 2.0000 | INHALATION_SPRAY | Freq: Four times a day (QID) | RESPIRATORY_TRACT | 5 refills | Status: DC | PRN

## 2023-07-15 NOTE — Progress Notes (Signed)
Received a fax from [facility]. Albuterol is not covered by his insurance. Xopenex is the preferred inhaler.  Per Dr. Jayme Cloud- send in Xopenex HFA 2 puffs every 6 hours as needed. I have sent in the Xopenex to the patient's pharmacy.  Nothing further needed.

## 2023-10-08 ENCOUNTER — Encounter: Payer: Self-pay | Admitting: Pulmonary Disease

## 2023-10-08 ENCOUNTER — Ambulatory Visit: Payer: BC Managed Care – PPO | Admitting: Pulmonary Disease

## 2023-10-08 VITALS — BP 110/78 | HR 71 | Temp 96.9°F | Ht 70.0 in | Wt 151.0 lb

## 2023-10-08 DIAGNOSIS — F17210 Nicotine dependence, cigarettes, uncomplicated: Secondary | ICD-10-CM

## 2023-10-08 DIAGNOSIS — J4489 Other specified chronic obstructive pulmonary disease: Secondary | ICD-10-CM

## 2023-10-08 DIAGNOSIS — J8410 Pulmonary fibrosis, unspecified: Secondary | ICD-10-CM | POA: Diagnosis not present

## 2023-10-08 MED ORDER — TRELEGY ELLIPTA 100-62.5-25 MCG/ACT IN AEPB
1.0000 | INHALATION_SPRAY | Freq: Every day | RESPIRATORY_TRACT | 11 refills | Status: AC
Start: 2023-10-29 — End: ?

## 2023-10-08 MED ORDER — TRELEGY ELLIPTA 100-62.5-25 MCG/ACT IN AEPB: 1.0000 | INHALATION_SPRAY | Freq: Every day | RESPIRATORY_TRACT | 0 refills | Status: DC

## 2023-10-08 MED ORDER — ALBUTEROL SULFATE HFA 108 (90 BASE) MCG/ACT IN AERS
2.0000 | INHALATION_SPRAY | Freq: Four times a day (QID) | RESPIRATORY_TRACT | 2 refills | Status: AC | PRN
Start: 2023-10-08 — End: ?

## 2023-10-08 NOTE — Patient Instructions (Addendum)
VISIT SUMMARY:  During today's visit, we discussed your concerns about your current inhaler not being covered by insurance, your ongoing symptoms of shortness of breath and wheezing, and your recent unexplained weight loss. We also talked about your smoking habits and the importance of quitting. We reviewed your recent medical history, including your last chest CT and pulmonary function tests.  YOUR PLAN:  -CHRONIC OBSTRUCTIVE PULMONARY DISEASE (COPD): COPD is a chronic lung condition that makes it hard to breathe. We discussed switching your inhaler to Trelegy, which you should use one puff daily. I provided samples for Trelegy. It's important to use albuterol for sudden symptoms. We also ordered new pulmonary function tests to check your lung function. Please follow up in three months.  -SMOKING CESSATION: Smoking is worsening your COPD and overall health. We talked about trying lollipops or other oral substitutes to help reduce your smoking. Quitting smoking is crucial for managing your COPD. You have failed Chantix and patches. Recommend website: becomeanex.org which has suggestions for quitting smoking.  Website is endorsed by the [facility]  -UNINTENTIONAL WEIGHT LOSS: You've experienced significant weight loss despite eating more. This could be due to a metabolic condition like hyperthyroidism. I recommend seeing a primary care provider for thyroid function tests and further evaluation.  -GENERAL HEALTH MAINTENANCE: You are up to date on your pneumonia vaccine but declined the flu vaccine. Staying current with vaccinations is important for your health. Please reconsider getting the flu vaccine.  INSTRUCTIONS:  Please follow up in three months. Make sure to get the pulmonary function tests done before your next visit. Also, schedule an appointment with a primary care provider for thyroid function tests and further evaluation of your weight loss.

## 2023-10-08 NOTE — Progress Notes (Signed)
Subjective:    Patient ID: Paul Fields, male    DOB: May 03, 1961, 62 y.o.   MRN: [id number]  Patient Care Team: Glori Luis, MD as PCP - General (Family Medicine) Salena Saner, MD as Consulting Physician (Pulmonary Disease)  Chief Complaint  Patient presents with   Follow-up    DOE. Wheezing. Cough with clear sputum in the mornings.     BACKGROUND/INTERVAL: Patient is a 62 year old current smoker (1 PPD, 67.5 PY) who presents for follow-up on the issue of COPD and a left upper lobe lung nodule.  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  He was last seen here on 20 August 2022.  Since that time he has not had any exacerbations however he discontinued use of Breztri and albuterol and all other medications.  He stated that he lost coverage for the [facility] however he did not let us know this.  He does have moderate COPD.  He unfortunately is back to smoking 1 pack of cigarettes per day.  He presents today for follow-up.  HPI Discussed the use of AI scribe software for clinical note transcription with the patient, who gave verbal consent to proceed.  History of Present Illness   The patient, with a history of moderate COPD, presents with concerns about his current inhaler, Breztri, no longer being covered by his insurance. He has not been using any inhalers, including albuterol, which he did not pick up from the pharmacy. He reports experiencing both shortness of breath and wheezing, particularly in the mornings, and describes his sputum as whiteish-yellow.  The patient continues to smoke, despite a previous lung surgery. He has tried quitting with Chantix and patches, but these attempts were unsuccessful.  He is not motivated to quit.  Additionally, the patient's wife has noticed unexplained weight loss over the past four months, despite the patient's high caloric intake. The patient has no primary care physician at present, as his previous  doctor is leaving the practice. He is seeking to establish care with a new provider.  The patient's last chest CT was in April, with no concerning findings. He is due for a follow-up in the coming year. The patient's last pulmonary function tests were in 2022, and he is due for repeat testing. He is up to date on his pneumonia vaccine but declined the flu vaccine.     DATA 02/03/2018 PFTs: FEV1 2.10 L or 58% predicted, FVC 3.40 L or 75% predicted, FEV1/FVC 62%, significant bronchodilator response both in FEV1 and reduced air trapping.  Lung volumes show significant air trapping at 158% fusion capacity normal. 05/05/2018 PFTs: FEV1 2.59 L or 71% predicted, FVC 3.98 L or 88% predicted, FEV1/FVC 65%, no significant bronchodilator response, diffusion capacity normal.  Hyperinflation and air trapping noted. 01/17/2021 CT chest: Calcified mediastinal nodes, 6 mm posterior medial right upper lobe groundglass nodule and 0.6 mm nodule in the right upper lobe. 04/17/2021 PFTs: FEV1 2.50 L or 75% predicted, FVC 4.01 L or 91% predicted, FEV1/FVC 62%. Hyperinflation and air trapping noted.  All airways component with bronchodilator response.  Diffusion capacity mildly reduced. 08/31/2021 CT chest: Interval development of a 7 mm irregular left upper lobe pulmonary nodule noncontrasted CT 3 to 6 months is recommended other pulmonary nodules stable.  Patient scheduled for a 3 month follow-up. 01/04/2022 CT chest: Interval enlargement of the previously noted left upper lobe pulmonary nodule measuring 1.2 x 0.5 cm. 01/22/2022 PET/CT: Hypermetabolic nodule in the left upper  lobe concerning for primary bronchogenic carcinoma.  No evidence of mediastinal nodal metastasis.  No distant metastatic disease.  Scattered pulmonary nodules and calcified small mediastinal lymph nodes consistent with prior granulomatous disease. 03/14/2022 surgical pathology (wedge resection): Necrotizing granuloma.  Cultures negative.  Review of  Systems A 10 point review of systems was performed and it is as noted above otherwise negative.   Patient Active Problem List   Diagnosis Date Noted   Pneumothorax after biopsy 02/14/2022    Priority: 1.   Nodule of upper lobe of left lung 04/04/2016    Priority: 2.   Centrilobular emphysema (HCC) 01/14/2018    Priority: 3.   Nicotine dependence, cigarettes, uncomplicated 05/27/2012    Priority: 4.   Left rib fracture 04/24/2022   S/P robot-assisted surgical procedure 03/14/2022   Vertigo 05/11/2021   Fixed pupil of left eye 01/09/2021   GERD (gastroesophageal reflux disease) 04/24/2018   Personal history of tobacco use, presenting hazards to health 03/14/2016   Hx of adenomatous colonic polyps 08/11/2012    Social History   Tobacco Use   Smoking status: Every Day    Current packs/day: 3.00    Average packs/day: 3.0 packs/day for 45.0 years (135.0 ttl pk-yrs)    Types: Cigarettes   Smokeless tobacco: Never   Tobacco comments:    1 PPD khj 10/08/2023    Started smoking in 1978    Heaviest was 3PPD  Substance Use Topics   Alcohol use: No    Comment: History of 6 pk beer on weekends- Last [facility] 11/2015    No Known Allergies  Current Meds  Medication Sig   albuterol (VENTOLIN HFA) 108 (90 Base) MCG/ACT inhaler Inhale 2 puffs into the lungs every 6 (six) hours as needed for wheezing or shortness of breath.   Fluticasone-Umeclidin-Vilant (TRELEGY ELLIPTA) 100-62.5-25 MCG/ACT AEPB Inhale 1 puff into the lungs daily.   [START ON 10/29/2023] Fluticasone-Umeclidin-Vilant (TRELEGY ELLIPTA) 100-62.5-25 MCG/ACT AEPB Inhale 1 puff into the lungs daily.    Immunization History  Administered Date(s) Administered   Influenza,inj,Quad PF,6+ Mos 09/10/2013   PFIZER Comirnaty(Gray Top)Covid-19 Tri-Sucrose Vaccine 02/06/2020, 02/27/2020   PNEUMOCOCCAL CONJUGATE-20 01/09/2021   Pneumococcal Polysaccharide-23 05/27/2012   Tdap 05/27/2012   Zoster Recombinant(Shingrix) 01/09/2021,  04/17/2021       Objective:     BP 110/78 (BP Location: Right Arm, Cuff Size: Normal)   Pulse 71   Temp (!) 96.9 F (36.1 C)   Ht 5\' 10"  (1.778 m)   Wt 151 lb (68.5 kg)   SpO2 99%   BMI 21.67 kg/m   SpO2: 99 % O2 Device: None (Room air)  GENERAL: Thin well-developed gentleman in no acute distress, fully ambulatory.  No conversational dyspnea.   HEAD: Normocephalic, atraumatic. EARS: Left otitis media and externa, Ruman impaction on right. EYES: Pupils equal, round, reactive to light.  No scleral icterus. MOUTH: Poor dentition, oral mucosa moist. NECK: Supple. No thyromegaly. Trachea midline. No JVD.  No adenopathy. PULMONARY: Good air entry bilaterally.  Scattered wheezes and rhonchi noted. CARDIOVASCULAR: S1 and S2. Regular rate and rhythm.  No rubs, murmurs or gallops heard. ABDOMEN: Benign. MUSCULOSKELETAL: No joint deformity, no clubbing, no edema. NEUROLOGIC: No focal deficit, no gait disturbance, speech is fluent. SKIN: Intact,warm,dry.  On limited exam, no rashes. PSYCH: Mood and behavior normal.    Assessment & Plan:     ICD-10-CM   1. COPD with asthma (HCC)  J44.89 Pulmonary Function Test [facility] Only   Noted to be stage 2 COPD  previously Reassess with PFTs    2. Pulmonary granuloma [facility])  J84.10    Status post resection May 2023 No sequela Patient back to lung cancer screening program    3. Tobacco dependence due to cigarettes  F17.210    Patient counseled regards to discontinuation of smoking      Orders Placed This Encounter  Procedures   Pulmonary Function Test [facility] Only    Standing Status:   Future    Standing Expiration Date:   10/07/2024    Order Specific Question:   Full PFT: includes the following: basic spirometry, spirometry pre & post bronchodilator, diffusion capacity (DLCO), lung volumes    Answer:   Full PFT    Order Specific Question:   This test can only be performed at    Answer:   [facility]    Meds ordered this encounter   Medications   Fluticasone-Umeclidin-Vilant (TRELEGY ELLIPTA) 100-62.5-25 MCG/ACT AEPB    Sig: Inhale 1 puff into the lungs daily.    Dispense:  28 each    Refill:  0    Order Specific Question:   Lot Number?    Answer:   [id number]    Order Specific Question:   Expiration Date?    Answer:   02/25/2025    Order Specific Question:   Quantity    Answer:   2   albuterol (VENTOLIN HFA) 108 (90 Base) MCG/ACT inhaler    Sig: Inhale 2 puffs into the lungs every 6 (six) hours as needed for wheezing or shortness of breath.    Dispense:  8 g    Refill:  2   Fluticasone-Umeclidin-Vilant (TRELEGY ELLIPTA) 100-62.5-25 MCG/ACT AEPB    Sig: Inhale 1 puff into the lungs daily.    Dispense:  28 each    Refill:  11   Discussion:    Chronic Obstructive Pulmonary Disease (COPD) Moderate COPD with persistent dyspnea and wheezing, exacerbated by smoking. Previous medications like Markus Daft are no longer covered by insurance, and albuterol was not picked up. Discussed Trelegy (one puff daily) as an alternative, provided samples and sent prescription to pharmacy. Emphasized the necessity of albuterol for acute symptoms and smoking cessation for COPD management. - Prescribe Trelegy inhaler, one puff daily - Provide samples and prescription for Trelegy - Renew albuterol prescription for use as needed - Order pulmonary function tests - Follow up in three months  Smoking Cessation Continues to smoke despite previous attempts with Chantix and nicotine patches, contributing to COPD symptoms and overall health decline. Discussed potential use of lollipops or other oral substitutes to reduce smoking frequency. - Encourage smoking cessation - Discuss potential use of lollipops or other oral substitutes  Unintentional Weight Loss Significant weight loss over the past four months despite increased caloric intake. Recent chest CT in April was normal. Differential diagnosis includes metabolic conditions such as  hyperthyroidism. Discussed the need for thyroid function tests and further metabolic workup with primary care provider. - Refer to primary care for thyroid function tests and further metabolic workup  General Health Maintenance Up to date on pneumonia vaccine but declined flu vaccine. Discussed the importance of staying current with vaccinations. - Ensure up-to-date vaccinations, including flu vaccine if reconsidered  Follow-up - Follow up in three months.     Advised if symptoms do not improve or worsen, to please contact office for sooner follow up or seek emergency care.    I spent 60 minutes of dedicated to the care of this patient on the date  of this encounter to include pre-visit review of records, face-to-face time with the patient discussing conditions above, post visit ordering of testing, clinical documentation with the electronic health record, making appropriate referrals as documented, and communicating necessary findings to members of the patients care team.     C. Danice Goltz, MD Advanced Bronchoscopy PCCM [facility]    *This note was generated using voice recognition software/Dragon and/or AI transcription program.  Despite best efforts to proofread, errors can occur which can change the meaning. Any transcriptional errors that result from this process are unintentional and may not be fully corrected at the time of dictation.

## 2024-01-01 ENCOUNTER — Ambulatory Visit: Payer: BC Managed Care – PPO | Attending: Pulmonary Disease

## 2024-01-01 DIAGNOSIS — J4489 Other specified chronic obstructive pulmonary disease: Secondary | ICD-10-CM | POA: Diagnosis not present

## 2024-01-01 DIAGNOSIS — J439 Emphysema, unspecified: Secondary | ICD-10-CM | POA: Insufficient documentation

## 2024-01-01 DIAGNOSIS — R062 Wheezing: Secondary | ICD-10-CM | POA: Insufficient documentation

## 2024-01-01 DIAGNOSIS — R059 Cough, unspecified: Secondary | ICD-10-CM | POA: Diagnosis not present

## 2024-01-01 DIAGNOSIS — F17210 Nicotine dependence, cigarettes, uncomplicated: Secondary | ICD-10-CM | POA: Diagnosis not present

## 2024-01-01 DIAGNOSIS — J449 Chronic obstructive pulmonary disease, unspecified: Secondary | ICD-10-CM | POA: Diagnosis present

## 2024-01-01 LAB — PULMONARY FUNCTION TEST ARMC ONLY
DL/VA % pred: 72 %
DL/VA: 2.99 ml/min/mmHg/L
DLCO unc % pred: 86 %
DLCO unc: 23.42 ml/min/mmHg
FEF 25-75 Post: 1.75 L/s
FEF 25-75 Pre: 1.18 L/s
FEF2575-%Change-Post: 49 %
FEF2575-%Pred-Post: 63 %
FEF2575-%Pred-Pre: 42 %
FEV1-%Change-Post: 17 %
FEV1-%Pred-Post: 79 %
FEV1-%Pred-Pre: 67 %
FEV1-Post: 2.75 L
FEV1-Pre: 2.35 L
FEV1FVC-%Change-Post: 6 %
FEV1FVC-%Pred-Pre: 75 %
FEV6-%Change-Post: 8 %
FEV6-%Pred-Post: 102 %
FEV6-%Pred-Pre: 93 %
FEV6-Post: 4.51 L
FEV6-Pre: 4.14 L
FEV6FVC-%Change-Post: 0 %
FEV6FVC-%Pred-Post: 103 %
FEV6FVC-%Pred-Pre: 104 %
FVC-%Change-Post: 9 %
FVC-%Pred-Post: 98 %
FVC-%Pred-Pre: 89 %
FVC-Post: 4.57 L
FVC-Pre: 4.17 L
Post FEV1/FVC ratio: 60 %
Post FEV6/FVC ratio: 99 %
Pre FEV1/FVC ratio: 56 %
Pre FEV6/FVC Ratio: 99 %
RV % pred: 202 %
RV: 4.72 L
TLC % pred: 130 %
TLC: 9.13 L

## 2024-01-01 MED ORDER — ALBUTEROL SULFATE (2.5 MG/3ML) 0.083% IN NEBU
2.5000 mg | INHALATION_SOLUTION | Freq: Once | RESPIRATORY_TRACT | Status: AC
  Administered 2024-01-01: 2.5 mg via RESPIRATORY_TRACT
  Filled 2024-01-01: qty 3

## 2024-01-08 ENCOUNTER — Ambulatory Visit: Payer: BC Managed Care – PPO | Admitting: Pulmonary Disease

## 2024-01-08 ENCOUNTER — Encounter: Payer: Self-pay | Admitting: Pulmonary Disease

## 2024-01-08 VITALS — BP 104/60 | HR 82 | Temp 97.1°F | Ht 70.0 in | Wt 152.6 lb

## 2024-01-08 DIAGNOSIS — F17210 Nicotine dependence, cigarettes, uncomplicated: Secondary | ICD-10-CM | POA: Diagnosis not present

## 2024-01-08 DIAGNOSIS — J8410 Pulmonary fibrosis, unspecified: Secondary | ICD-10-CM | POA: Diagnosis not present

## 2024-01-08 DIAGNOSIS — J4489 Other specified chronic obstructive pulmonary disease: Secondary | ICD-10-CM | POA: Diagnosis not present

## 2024-01-08 NOTE — Progress Notes (Signed)
 Subjective:    Patient ID: Paul Fields, male    DOB: 06/01/1961, 63 y.o.   MRN: [id number]  Patient Care Team: Glori Luis, MD (Inactive) as PCP - General (Family Medicine) Salena Saner, MD as Consulting Physician (Pulmonary Disease)  Chief Complaint  Patient presents with   Follow-up    DOE. Little wheezing. Occasional cough.     BACKGROUND/INTERVAL: Patient is a 63 year old current smoker (1 PPD, 67.5 PY) who presents for follow-up on the issue of COPD and a left upper lobe lung nodule.  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  He was last seen here on 08 October 2019 for. He does have moderate COPD.  He unfortunately is back to smoking 1 pack of cigarettes per day.  He presents today for follow-up.   HPI Discussed the use of AI scribe software for clinical note transcription with the patient, who gave verbal consent to proceed.  History of Present Illness   The patient, with COPD and emphysema, presents for follow-up.  His breathing is described as 'pretty good' despite continuing to smoke about a pack of cigarettes a day, although he mentions not smoking entire cigarettes. He uses Trelegy inhaler sporadically, stating he uses it 'once in a while' when he remembers.  He recently underwent a breathing test, which showed improvement with a nebulizer. His lung function improved by 17% after the nebulizer, from 67% to 79%.  He has a history of lobectomy and this did not not change his lung function much since studies done in 2022. He attributes his stable condition to being active and working all the time.  Regarding smoking cessation, he has previously used Chantix, which was effective for about three months, and has tried nicotine patches. 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  He is scheduled for a lung cancer screening test in April and has received a letter to make an  appointment for this. He typically goes to [place] for such tests.     DATA 02/03/2018 PFTs: FEV1 2.10 L or 58% predicted, FVC 3.40 L or 75% predicted, FEV1/FVC 62%, significant bronchodilator response both in FEV1 and reduced air trapping.  Lung volumes show significant air trapping at 158% fusion capacity normal. 05/05/2018 PFTs: FEV1 2.59 L or 71% predicted, FVC 3.98 L or 88% predicted, FEV1/FVC 65%, no significant bronchodilator response, diffusion capacity normal.  Hyperinflation and air trapping noted. 01/17/2021 CT chest: Calcified mediastinal nodes, 6 mm posterior medial right upper lobe groundglass nodule and 0.6 mm nodule in the right upper lobe. 04/17/2021 PFTs: FEV1 2.50 L or 75% predicted, FVC 4.01 L or 91% predicted, FEV1/FVC 62%. Hyperinflation and air trapping noted.  All airways component with bronchodilator response.  Diffusion capacity mildly reduced. 08/31/2021 CT chest: Interval development of a 7 mm irregular left upper lobe pulmonary nodule noncontrasted CT 3 to 6 months is recommended other pulmonary nodules stable.  Patient scheduled for a 3 month follow-up. 01/04/2022 CT chest: Interval enlargement of the previously noted left upper lobe pulmonary nodule measuring 1.2 x 0.5 cm. 01/22/2022 PET/CT: Hypermetabolic nodule in the left upper lobe concerning for primary bronchogenic carcinoma.  No evidence of mediastinal nodal metastasis.  No distant metastatic disease.  Scattered pulmonary nodules and calcified small mediastinal lymph nodes consistent with prior granulomatous disease. 03/14/2022 surgical pathology (wedge resection): Necrotizing granuloma.  Cultures negative. 01/01/2024 PFTs: FEV1 2.35 L or 67% predicted, FVC 4.17  L or 89% predicted, FEV1/FVC was 56%.  There was a 17% change in FEV1 after bronchodilator.  Lung volumes show hyperinflation and air trapping diffusion capacity is mildly reduced.   Review of Systems A 10 point review of systems was performed and it  is as noted above otherwise negative.   Patient Active Problem List   Diagnosis Date Noted   Pneumothorax after biopsy 02/14/2022    Priority: 1.   Nodule of upper lobe of left lung 04/04/2016    Priority: 2.   Centrilobular emphysema (HCC) 01/14/2018    Priority: 3.   Nicotine dependence, cigarettes, uncomplicated 05/27/2012    Priority: 4.   Left rib fracture 04/24/2022   S/P robot-assisted surgical procedure 03/14/2022   Vertigo 05/11/2021   Fixed pupil of left eye 01/09/2021   GERD (gastroesophageal reflux disease) 04/24/2018   Personal history of tobacco use, presenting hazards to health 03/14/2016   Hx of adenomatous colonic polyps 08/11/2012    Social History   Tobacco Use   Smoking status: Every Day    Current packs/day: 3.00    Average packs/day: 3.0 packs/day for 45.0 years (135.0 ttl pk-yrs)    Types: Cigarettes   Smokeless tobacco: Never   Tobacco comments:    1 PPD khj 01/08/2024    Started smoking in 1978    Heaviest was 3PPD  Substance Use Topics   Alcohol use: No    Comment: History of 6 pk beer on weekends- Last [facility] 11/2015    No Known Allergies  Current Meds  Medication Sig   albuterol (VENTOLIN HFA) 108 (90 Base) MCG/ACT inhaler Inhale 2 puffs into the lungs every 6 (six) hours as needed for wheezing or shortness of breath.   Fluticasone-Umeclidin-Vilant (TRELEGY ELLIPTA) 100-62.5-25 MCG/ACT AEPB Inhale 1 puff into the lungs daily.    Immunization History  Administered Date(s) Administered   Influenza,inj,Quad PF,6+ Mos 09/10/2013   PFIZER Comirnaty(Gray Top)Covid-19 Tri-Sucrose Vaccine 02/06/2020, 02/27/2020   PNEUMOCOCCAL CONJUGATE-20 01/09/2021   Pneumococcal Polysaccharide-23 05/27/2012   Tdap 05/27/2012   Zoster Recombinant(Shingrix) 01/09/2021, 04/17/2021        Objective:     BP 104/60 (BP Location: Right Arm, Cuff Size: Normal)   Pulse 82   Temp (!) 97.1 F (36.2 C)   Ht 5\' 10"  (1.778 m)   Wt 152 lb 9.6 oz (69.2 kg)   SpO2  96%   BMI 21.90 kg/m   SpO2: 96 % O2 Device: None (Room air)  GENERAL: Thin well-developed gentleman in no acute distress, fully ambulatory.  No conversational dyspnea.   HEAD: Normocephalic, atraumatic. EARS: Left otitis media and externa, Ruman impaction on right. EYES: Pupils equal, round, reactive to light.  No scleral icterus. MOUTH: Poor dentition, oral mucosa moist. NECK: Supple. No thyromegaly. Trachea midline. No JVD.  No adenopathy. PULMONARY: Good air entry bilaterally.  Scattered wheezes and rhonchi noted. CARDIOVASCULAR: S1 and S2. Regular rate and rhythm.  No rubs, murmurs or gallops heard. ABDOMEN: Benign. MUSCULOSKELETAL: No joint deformity, no clubbing, no edema. NEUROLOGIC: No focal deficit, no gait disturbance, speech is fluent. SKIN: Intact,warm,dry.  On limited exam, no rashes. PSYCH: Mood and behavior normal.   Assessment & Plan:     ICD-10-CM   1. COPD with asthma (HCC)  J44.89     2. Pulmonary granuloma (HCC)  J84.10     3. Tobacco dependence due to cigarettes  F17.210      Discussion:    Chronic Obstructive Pulmonary Disease (COPD) with emphysema and asthma component  COPD with emphysema and an asthma component, classified as moderate to severe. Pulmonary function tests showed improvement from 67% to 79% post-nebulizer, indicating a reversible component consistent with asthma. Despite partial lung resection, his condition has remained stable since 2022. He continues to smoke approximately one pack per day, exacerbating his condition. Inconsistent use of Trelegy inhaler affects symptom management and lung function. - Advise daily use of Trelegy inhaler with proper mouth rinsing post-use to prevent oral thrush. - Encourage smoking cessation with options including Chantix, nicotine patches, and non-nicotine flavored air devices. - Schedule lung cancer screening in April and ensure follow-up for appointment scheduling.  Smoking cessation Continues to smoke  despite understanding the negative impact on COPD. Previous attempts with Chantix and nicotine patches were unsuccessful. Discussed alternative methods such as non-nicotine flavored air devices (e.g. FUM) to reduce nicotine dependency, which are cost-effective and provide a non-nicotine option. - Provide information on non-nicotine flavored air devices as a smoking cessation aid. - Encourage another attempt at smoking cessation using available aids and support.      Advised if symptoms do not improve or worsen, to please contact office for sooner follow up or seek emergency care.    I spent 32 minutes of dedicated to the care of this patient on the date of this encounter to include pre-visit review of records, face-to-face time with the patient discussing conditions above, post visit ordering of testing, clinical documentation with the electronic health record, making appropriate referrals as documented, and communicating necessary findings to members of the patients care team.     C. Danice Goltz, MD Advanced Bronchoscopy PCCM [facility]    *This note was generated using voice recognition software/Dragon and/or AI transcription program.  Despite best efforts to proofread, errors can occur which can change the meaning. Any transcriptional errors that result from this process are unintentional and may not be fully corrected at the time of dictation.

## 2024-01-08 NOTE — Progress Notes (Deleted)
 Subjective:    Patient ID: Paul Fields, male    DOB: [birth year], 63 y.o.   MRN: [id number]  Patient Care Team: Glori Luis, MD (Inactive) as PCP - General (Family Medicine) Salena Saner, MD as Consulting Physician (Pulmonary Disease)  Chief Complaint  Patient presents with  . Follow-up    DOE. Little wheezing. Occasional cough.     BACKGROUND/INTERVAL:  HPI    Review of Systems A 10 point review of systems was performed and it is as noted above otherwise negative.   Patient Active Problem List   Diagnosis Date Noted  . Pneumothorax after biopsy 02/14/2022    Priority: 1.  . Nodule of upper lobe of left lung 04/04/2016    Priority: 2.  . Centrilobular emphysema (HCC) 01/14/2018    Priority: 3.  . Nicotine dependence, cigarettes, uncomplicated 05/27/2012    Priority: 4.  . Left rib fracture 04/24/2022  . S/P robot-assisted surgical procedure 03/14/2022  . Vertigo 05/11/2021  . Fixed pupil of left eye 01/09/2021  . GERD (gastroesophageal reflux disease) 04/24/2018  . Personal history of tobacco use, presenting hazards to health 03/14/2016  . Hx of adenomatous colonic polyps 08/11/2012    Social History   Tobacco Use  . Smoking status: Every Day    Current packs/day: 3.00    Average packs/day: 3.0 packs/day for 45.0 years (135.0 ttl pk-yrs)    Types: Cigarettes  . Smokeless tobacco: Never  . Tobacco comments:    1 PPD khj 01/08/2024    Started smoking in 1978    Heaviest was 3PPD  Substance Use Topics  . Alcohol use: No    Comment: History of 6 pk beer on weekends- Last [facility] 11/2015    No Known Allergies  Current Meds  Medication Sig  . albuterol (VENTOLIN HFA) 108 (90 Base) MCG/ACT inhaler Inhale 2 puffs into the lungs every 6 (six) hours as needed for wheezing or shortness of breath.  . Fluticasone-Umeclidin-Vilant (TRELEGY ELLIPTA) 100-62.5-25 MCG/ACT AEPB Inhale 1 puff into the lungs daily.    Immunization History  Administered  Date(s) Administered  . Influenza,inj,Quad PF,6+ Mos 09/10/2013  . PFIZER Comirnaty(Gray Top)Covid-19 Tri-Sucrose Vaccine 02/06/2020, 02/27/2020  . PNEUMOCOCCAL CONJUGATE-20 01/09/2021  . Pneumococcal Polysaccharide-23 05/27/2012  . Tdap 05/27/2012  . Zoster Recombinant(Shingrix) 01/09/2021, 04/17/2021        Objective:     BP 104/60 (BP Location: Right Arm, Cuff Size: Normal)   Pulse 82   Temp (!) 97.1 F (36.2 C)   Ht 5\' 10"  (1.778 m)   Wt 152 lb 9.6 oz (69.2 kg)   SpO2 96%   BMI 21.90 kg/m   SpO2: 96 % O2 Device: None (Room air)  GENERAL: HEAD: Normocephalic, atraumatic.  EYES: Pupils equal, round, reactive to light.  No scleral icterus.  MOUTH:  NECK: Supple. No thyromegaly. Trachea midline. No JVD.  No adenopathy. PULMONARY: Good air entry bilaterally.  No adventitious sounds. CARDIOVASCULAR: S1 and S2. Regular rate and rhythm.  ABDOMEN: MUSCULOSKELETAL: No joint deformity, no clubbing, no edema.  NEUROLOGIC:  SKIN: Intact,warm,dry. PSYCH:        Assessment & Plan:   No diagnosis found.  No orders of the defined types were placed in this encounter.   No orders of the defined types were placed in this encounter.      Advised if symptoms do not improve or worsen, to please contact office for sooner follow up or seek emergency care.    I spent xxx minutes  of dedicated to the care of this patient on the date of this encounter to include pre-visit review of records, face-to-face time with the patient discussing conditions above, post visit ordering of testing, clinical documentation with the electronic health record, making appropriate referrals as documented, and communicating necessary findings to members of the patients care team.     C. Danice Goltz, MD [facility]    *This note was generated using voice recognition software/Dragon and/or AI transcription program.  Despite best efforts to  proofread, errors can occur which can change the meaning. Any transcriptional errors that result from this process are unintentional and may not be fully corrected at the time of dictation.

## 2024-01-08 NOTE — Patient Instructions (Addendum)
 VISIT SUMMARY:  You came in today for a follow-up on your COPD and emphysema. We discussed your current symptoms, smoking habits, and recent test results. Your lung function has shown improvement with the use of a nebulizer, but you continue to smoke about a pack of cigarettes a day. We also talked about your inconsistent use of the Trelegy inhaler and your upcoming lung cancer screening.  YOUR PLAN:  -CHRONIC OBSTRUCTIVE PULMONARY DISEASE (COPD) WITH EMPHYSEMA AND ASTHMA COMPONENT: COPD is a chronic lung disease that makes it hard to breathe, and emphysema is a type of COPD that damages the air sacs in your lungs. Your condition has an asthma component, which means part of your breathing difficulty is reversible. Your lung function improved from 67% to 79% after using a nebulizer. It's important to use your Trelegy inhaler daily and rinse your mouth afterward to prevent oral thrush. We also discussed the importance of quitting smoking to help manage your symptoms.  -SMOKING CESSATION: Smoking makes your COPD worse. You've tried Chantix and nicotine patches before without long-term success. We talked about trying non-nicotine flavored air devices as a new method to help you quit smoking. These devices are cost-effective and provide a non-nicotine option to reduce your dependency. We encourage you to make another attempt at quitting smoking using these aids and support. Recommend: FUM (tryfum.com).  Also recommend the website becomeanex.org from the [facility] which can provide you with tips and support for quitting smoking.  INSTRUCTIONS:  Please make sure to use your Trelegy inhaler daily and rinse your mouth afterward. Schedule your lung cancer screening for April and follow up on the appointment. Consider trying non-nicotine flavored air devices to help with smoking cessation, and feel free to reach out if you need additional support or resources.  See you in follow-up in 4 months time call sooner  should any new problems arise.

## 2024-01-12 ENCOUNTER — Telehealth: Payer: Self-pay | Admitting: Family Medicine

## 2024-01-12 NOTE — Telephone Encounter (Signed)
 Dr Birdie Sons is no longer at this location. Please call the office to schedule a Transfer of Care to either Dr Charlann Lange, Darleen Crocker or Kara Dies, NP. Purvis Sheffield, please schedule a TOC visit for this patient. [facility]   Thank you

## 2024-01-26 ENCOUNTER — Encounter: Payer: Self-pay | Admitting: Pulmonary Disease

## 2024-02-19 ENCOUNTER — Ambulatory Visit
Admission: RE | Admit: 2024-02-19 | Discharge: 2024-02-19 | Disposition: A | Discharge status: Home / Self Care | Source: Ambulatory Visit | Attending: Acute Care | Admitting: Acute Care

## 2024-02-19 DIAGNOSIS — Z87891 Personal history of nicotine dependence: Secondary | ICD-10-CM | POA: Insufficient documentation

## 2024-02-19 DIAGNOSIS — F17210 Nicotine dependence, cigarettes, uncomplicated: Secondary | ICD-10-CM | POA: Diagnosis present

## 2024-02-19 DIAGNOSIS — Z122 Encounter for screening for malignant neoplasm of respiratory organs: Secondary | ICD-10-CM | POA: Diagnosis present

## 2024-03-17 ENCOUNTER — Telehealth: Payer: Self-pay

## 2024-03-17 NOTE — Telephone Encounter (Signed)
 Results have been reviewed by Margit Shelling, NP. Please advise patient that he will complete a 6 month follow up scan to evaluate nodule stability. Previous nodule 8.5 mm now 9.3 mm since last years scan.  Please order 6 month f/u scan. Send results and plan to PCP.   Dr. Viva Grise, I included you in on the message just as an fyi.   IMPRESSION: Lung-RADS 2, benign appearance or behavior. Continue annual screening with low-dose chest CT without contrast in 12 months.   9.3 mm irregular nodular opacity in the right upper lobe, as above, favored to be grossly unchanged. Attention on annual follow-up is suggested.   Aortic Atherosclerosis (ICD10-I70.0) and Emphysema (ICD10-J43.9).

## 2024-03-18 NOTE — Telephone Encounter (Signed)
 LVM to review recent results. Requested call back.

## 2024-03-19 ENCOUNTER — Other Ambulatory Visit: Payer: Self-pay

## 2024-03-19 DIAGNOSIS — Z87891 Personal history of nicotine dependence: Secondary | ICD-10-CM

## 2024-03-19 DIAGNOSIS — Z122 Encounter for screening for malignant neoplasm of respiratory organs: Secondary | ICD-10-CM

## 2024-03-19 DIAGNOSIS — R911 Solitary pulmonary nodule: Secondary | ICD-10-CM

## 2024-03-19 NOTE — Telephone Encounter (Signed)
 I have called and spoke with the patient. I have reviewed recent CT results. He is in agreement to complete a 6 month f/u scan to evaluate stability of the 9.3 mm nodule. 6 month order placed and appt scheduled for 08/25/2025. Results and plan to PCP.

## 2024-03-19 NOTE — Telephone Encounter (Signed)
 4 month f/u appt also scheduled with Dr. Viva Grise per patient request. Advises providers schedule was not open the day he was in the office.

## 2024-03-24 ENCOUNTER — Ambulatory Visit

## 2024-03-24 VITALS — BP 104/70 | HR 63 | Temp 97.9°F | Ht 71.0 in | Wt 151.0 lb

## 2024-03-24 DIAGNOSIS — R7303 Prediabetes: Secondary | ICD-10-CM | POA: Diagnosis not present

## 2024-03-24 DIAGNOSIS — Z125 Encounter for screening for malignant neoplasm of prostate: Secondary | ICD-10-CM

## 2024-03-24 DIAGNOSIS — R634 Abnormal weight loss: Secondary | ICD-10-CM | POA: Insufficient documentation

## 2024-03-24 DIAGNOSIS — Z23 Encounter for immunization: Secondary | ICD-10-CM | POA: Diagnosis not present

## 2024-03-24 DIAGNOSIS — F988 Other specified behavioral and emotional disorders with onset usually occurring in childhood and adolescence: Secondary | ICD-10-CM | POA: Insufficient documentation

## 2024-03-24 DIAGNOSIS — Z1329 Encounter for screening for other suspected endocrine disorder: Secondary | ICD-10-CM

## 2024-03-24 DIAGNOSIS — J432 Centrilobular emphysema: Secondary | ICD-10-CM | POA: Diagnosis not present

## 2024-03-24 DIAGNOSIS — Z13220 Encounter for screening for lipoid disorders: Secondary | ICD-10-CM

## 2024-03-24 DIAGNOSIS — Z130 Encounter for screening for diseases of the blood and blood-forming organs and certain disorders involving the immune mechanism: Secondary | ICD-10-CM

## 2024-03-24 DIAGNOSIS — Z860101 Personal history of adenomatous and serrated colon polyps: Secondary | ICD-10-CM

## 2024-03-24 DIAGNOSIS — F17200 Nicotine dependence, unspecified, uncomplicated: Secondary | ICD-10-CM

## 2024-03-24 HISTORY — DX: Abnormal weight loss: R63.4

## 2024-03-24 LAB — LIPID PANEL
Cholesterol: 196 mg/dL (ref 0–200)
HDL: 64 mg/dL (ref >39.00)
LDL Cholesterol: 114 mg/dL — ABNORMAL HIGH (ref 0–99)
NonHDL: 132.23
Total CHOL/HDL Ratio: 3
Triglycerides: 91 mg/dL (ref 0.0–149.0)
VLDL: 18.2 mg/dL (ref 0.0–40.0)

## 2024-03-24 LAB — COMPREHENSIVE METABOLIC PANEL WITH GFR
ALT: 15 U/L (ref 0–53)
AST: 13 U/L (ref 0–37)
Albumin: 4.7 g/dL (ref 3.5–5.2)
Alkaline Phosphatase: 49 U/L (ref 39–117)
BUN: 14 mg/dL (ref 6–23)
CO2: 33 meq/L — ABNORMAL HIGH (ref 19–32)
Calcium: 9.5 mg/dL (ref 8.4–10.5)
Chloride: 102 meq/L (ref 96–112)
Creatinine, Ser: 0.96 mg/dL (ref 0.40–1.50)
GFR: 83.39 mL/min (ref >60.00)
Glucose, Bld: 94 mg/dL (ref 70–99)
Potassium: 5.3 meq/L — ABNORMAL HIGH (ref 3.5–5.1)
Sodium: 140 meq/L (ref 135–145)
Total Bilirubin: 0.6 mg/dL (ref 0.2–1.2)
Total Protein: 6.5 g/dL (ref 6.0–8.3)

## 2024-03-24 LAB — CBC WITH DIFFERENTIAL/PLATELET
Basophils Absolute: 0 10*3/uL (ref 0.0–0.1)
Basophils Relative: 0.8 % (ref 0.0–3.0)
Eosinophils Absolute: 0.1 10*3/uL (ref 0.0–0.7)
Eosinophils Relative: 2.8 % (ref 0.0–5.0)
HCT: 48.6 % (ref 39.0–52.0)
Hemoglobin: 16.1 g/dL (ref 13.0–17.0)
Lymphocytes Relative: 35.7 % (ref 12.0–46.0)
Lymphs Abs: 1.8 10*3/uL (ref 0.7–4.0)
MCHC: 33.1 g/dL (ref 30.0–36.0)
MCV: 97 fl (ref 78.0–100.0)
Monocytes Absolute: 0.4 10*3/uL (ref 0.1–1.0)
Monocytes Relative: 7.2 % (ref 3.0–12.0)
Neutro Abs: 2.7 10*3/uL (ref 1.4–7.7)
Neutrophils Relative %: 53.5 % (ref 43.0–77.0)
Platelets: 245 10*3/uL (ref 150.0–400.0)
RBC: 5.01 Mil/uL (ref 4.22–5.81)
RDW: 13 % (ref 11.5–15.5)
WBC: 5 10*3/uL (ref 4.0–10.5)

## 2024-03-24 LAB — TSH: TSH: 0.81 u[IU]/mL (ref 0.35–5.50)

## 2024-03-24 LAB — HEMOGLOBIN A1C: Hgb A1c MFr Bld: 6 % (ref 4.6–6.5)

## 2024-03-24 LAB — PSA: PSA: 0.81 ng/mL (ref 0.10–4.00)

## 2024-03-24 NOTE — Assessment & Plan Note (Signed)
 Check PSA. ?

## 2024-03-24 NOTE — Assessment & Plan Note (Signed)
 Discussed behavioral strategies to reduce nail biting.  Consider referral to behavioral therapy or counseling if anxiety or stress is a significant factor.

## 2024-03-24 NOTE — Assessment & Plan Note (Signed)
 Pre contemplation phase, has tried Chantix , nicotine  patch in the past. Stopped smoking for about 3 months on Chantix  and continued smoking when on nicotine  patch. Will continue counseling again during f/u visit.

## 2024-03-24 NOTE — Assessment & Plan Note (Signed)
 Not compliant with current treatment with Trelegy and albuterol . Strongly encouraged patient to start treatment as counseled by pulmonology which is Trelegy once a day and prn albuterol  every 6 hourly as needed for wheezing, sob. Patient has refills from pulmonology as well.

## 2024-03-24 NOTE — Progress Notes (Signed)
 Established patient of Dr. Lovetta Rucks presenting for transfer of care.    Subjective  Patient ID: Paul Fields, male    DOB: 11-21-1960  Age: 63 y.o. MRN: [id number]  Chief Complaint  Patient presents with   Establish Care   Transitions Of Care    He  has a past medical history of Arthritis, Clavicle fracture (1986), COPD (chronic obstructive pulmonary disease) (HCC), Diverticulosis, Elbow fracture, right (1968), Emphysema lung (HCC), Hemorrhoid, Major depressive disorder, recurrent episode (HCC) (03/07/2016), and Polyp of colon, adenomatous.  HPI 1) Emphysema, continues to smoke cigarette \:  - Smokes about one pack of cigarettes a day.  - Continues to have cough for about 2 months. He has noted worsening of his cough. Cough is worse first thing in the morning, dry in nature.  - He is established with pulmonology and is on Albuterol  prn but has not been using for about a month or so, needs refill. He has been using Trelegy Ellipta  once in a while only instead of using it every day.  - Has tried Nicotine  patch, Chantix  in the past.   2) Colorectal cancer screening: Has colonoscopy scheduled for 04/16/24.  3) Patient's wife is present during today's appointment. She is concerned that patient despite eating whatever he wants he is not gaining weight.  Denies heat/cold intolerance, nausea, vomiting, fever, chills.   4) Prediabetes:  12/14/2019 A1c was 6.3%.  5) Onychophagia: Known h/o for years.    ROS As per HPI    Objective:      BP 104/70   Pulse 63   Temp 97.9 F (36.6 C) (Oral)   Ht 5\' 11"  (1.803 m)   Wt 151 lb (68.5 kg)   SpO2 96%   BMI 21.06 kg/m      03/24/2024   11:08 AM 06/18/2022   10:13 AM 04/24/2022    4:45 PM  Depression screen PHQ 2/9  Decreased Interest 0 0 0  Down, Depressed, Hopeless 0 0 0  PHQ - 2 Score 0 0 0  Altered sleeping 0    Tired, decreased energy 0    Change in appetite 0    Feeling bad or failure about yourself  0    Trouble  concentrating 0    Moving slowly or fidgety/restless 0    Suicidal thoughts 0    PHQ-9 Score 0    Difficult doing work/chores Not difficult at all        03/24/2024   11:09 AM 06/18/2022   10:13 AM  GAD 7 : Generalized Anxiety Score  Nervous, Anxious, on Edge 0 0  Control/stop worrying 0 0  Worry too much - different things 0 0  Trouble relaxing 0 0  Restless 0 0  Easily annoyed or irritable 0 0  Afraid - awful might happen 0 0  Total GAD 7 Score 0 0  Anxiety Difficulty Not difficult at all Not difficult at all      03/24/2024   11:08 AM 06/18/2022   10:13 AM 04/24/2022    4:45 PM  Depression screen PHQ 2/9  Decreased Interest 0 0 0  Down, Depressed, Hopeless 0 0 0  PHQ - 2 Score 0 0 0  Altered sleeping 0    Tired, decreased energy 0    Change in appetite 0    Feeling bad or failure about yourself  0    Trouble concentrating 0    Moving slowly or fidgety/restless 0    Suicidal thoughts 0  PHQ-9 Score 0    Difficult doing work/chores Not difficult at all        03/24/2024   11:09 AM 06/18/2022   10:13 AM  GAD 7 : Generalized Anxiety Score  Nervous, Anxious, on Edge 0 0  Control/stop worrying 0 0  Worry too much - different things 0 0  Trouble relaxing 0 0  Restless 0 0  Easily annoyed or irritable 0 0  Afraid - awful might happen 0 0  Total GAD 7 Score 0 0  Anxiety Difficulty Not difficult at all Not difficult at all   SDOH Screenings   Food Insecurity: No Food Insecurity (03/23/2024)  Housing: Unknown (03/23/2024)  Transportation Needs: No Transportation Needs (03/23/2024)  Depression (PHQ2-9): Low Risk  (03/24/2024)  Financial Resource Strain: Medium Risk (03/23/2024)  Physical Activity: Sufficiently Active (03/23/2024)  Social Connections: Unknown (03/23/2024)  Stress: No Stress Concern Present (03/23/2024)  Tobacco Use: High Risk (03/24/2024)     Physical Exam Constitutional:      General: He is not in acute distress.    Appearance: Normal appearance.   HENT:     Head: Normocephalic and atraumatic.     Right Ear: Tympanic membrane normal.     Left Ear: Tympanic membrane normal.     Nose: No congestion.     Mouth/Throat:     Mouth: Mucous membranes are moist.  Neck:     Thyroid : No thyroid  mass or thyroid  tenderness.  Cardiovascular:     Rate and Rhythm: Normal rate and regular rhythm.  Pulmonary:     Effort: Pulmonary effort is normal.     Breath sounds: Normal breath sounds.  Abdominal:     General: Bowel sounds are normal. There is no distension.     Palpations: Abdomen is soft.     Tenderness: There is no guarding.  Musculoskeletal:     Right hand: Normal pulse.     Left hand: Normal pulse.     Cervical back: Neck supple. No rigidity.     Right lower leg: No edema.     Left lower leg: No edema.     Comments: Finger nails b/l: short, jagged edges, without sign of infection.   Skin:    General: Skin is warm.  Neurological:     Mental Status: He is alert and oriented to person, place, and time.  Psychiatric:        Mood and Affect: Mood normal.        Behavior: Behavior normal.        No results found for any visits on 03/24/24.  The ASCVD Risk score (Arnett DK, et al., 2019) failed to calculate for the following reasons:   Cannot find a previous HDL lab   Cannot find a previous total cholesterol lab    Assessment & Plan:  Prediabetes Assessment & Plan: Reviewed last A1c, repeat today  Orders: -     Hemoglobin A1c  Prostate cancer screening Assessment & Plan: Check PSA.   Orders: -     PSA  Lipid screening Assessment & Plan: Check PSA.   Orders: -     Lipid panel  Centrilobular emphysema (HCC) Assessment & Plan: Not compliant with current treatment with Trelegy and albuterol . Strongly encouraged patient to start treatment as counseled by pulmonology which is Trelegy once a day and prn albuterol  every 6 hourly as needed for wheezing, sob. Patient has refills from pulmonology as well.    Need  for tetanus booster Assessment & Plan: Update  immunization today after counseling him on potential s/e including pain on site of immunization, fatigue. Patient agreeable.   Orders: -     Tdap vaccine greater than or equal to 7yo IM  Weight decreasing Assessment & Plan: Check TSH, CBC, CMP.  Weight has been stable since his last visit with Dr. Lovetta Rucks in 2023.   Orders: -     TSH -     Comprehensive metabolic panel with GFR -     CBC with Differential/Platelet  Onychophagia Assessment & Plan: Discussed behavioral strategies to reduce nail biting.  Consider referral to behavioral therapy or counseling if anxiety or stress is a significant factor.   Nicotine  dependence with current use Assessment & Plan: Pre contemplation phase, has tried Chantix , nicotine  patch in the past. Stopped smoking for about 3 months on Chantix  and continued smoking when on nicotine  patch. Will continue counseling again during f/u visit.    Hx of adenomatous colonic polyps Assessment & Plan: Has appointment for colonoscopy scheduled for 04/16/24     Return in about 1 year (around 03/24/2025) for Welcome to medicare .   Jacklin Mascot, MD

## 2024-03-24 NOTE — Assessment & Plan Note (Signed)
 Has appointment for colonoscopy scheduled for 04/16/24

## 2024-03-24 NOTE — Assessment & Plan Note (Signed)
 Update immunization today after counseling him on potential s/e including pain on site of immunization, fatigue. Patient agreeable.

## 2024-03-24 NOTE — Assessment & Plan Note (Signed)
 Check TSH, CBC, CMP.  Weight has been stable since his last visit with Dr. Lovetta Rucks in 2023.

## 2024-03-24 NOTE — Assessment & Plan Note (Signed)
 Reviewed last A1c, repeat today

## 2024-03-25 ENCOUNTER — Ambulatory Visit: Payer: Self-pay

## 2024-03-25 DIAGNOSIS — E875 Hyperkalemia: Secondary | ICD-10-CM | POA: Insufficient documentation

## 2024-03-25 DIAGNOSIS — E782 Mixed hyperlipidemia: Secondary | ICD-10-CM | POA: Insufficient documentation

## 2024-03-25 MED ORDER — ROSUVASTATIN CALCIUM 20 MG PO TABS
20.0000 mg | ORAL_TABLET | Freq: Every day | ORAL | 0 refills | Status: DC
Start: 2024-03-25 — End: 2024-05-19

## 2024-03-29 ENCOUNTER — Other Ambulatory Visit (INDEPENDENT_AMBULATORY_CARE_PROVIDER_SITE_OTHER)

## 2024-03-29 ENCOUNTER — Ambulatory Visit (AMBULATORY_SURGERY_CENTER)

## 2024-03-29 VITALS — Ht 70.0 in | Wt 154.0 lb

## 2024-03-29 DIAGNOSIS — Z860100 Personal history of colon polyps, unspecified: Secondary | ICD-10-CM

## 2024-03-29 DIAGNOSIS — E875 Hyperkalemia: Secondary | ICD-10-CM | POA: Diagnosis not present

## 2024-03-29 MED ORDER — NA SULFATE-K SULFATE-MG SULF 17.5-3.13-1.6 GM/177ML PO SOLN
1.0000 | Freq: Once | ORAL | 0 refills | Status: AC
Start: 2024-03-29 — End: 2024-03-29

## 2024-03-29 NOTE — Progress Notes (Signed)

## 2024-03-30 ENCOUNTER — Ambulatory Visit: Payer: Self-pay

## 2024-03-30 LAB — POTASSIUM: Potassium: 4.1 meq/L (ref 3.5–5.1)

## 2024-04-01 ENCOUNTER — Encounter: Payer: Self-pay | Admitting: Internal Medicine

## 2024-04-16 ENCOUNTER — Encounter: Payer: Self-pay | Admitting: Internal Medicine

## 2024-04-16 ENCOUNTER — Ambulatory Visit: Admitting: Internal Medicine

## 2024-04-16 VITALS — BP 115/56 | HR 69 | Temp 98.2°F | Resp 11

## 2024-04-16 DIAGNOSIS — D125 Benign neoplasm of sigmoid colon: Secondary | ICD-10-CM

## 2024-04-16 DIAGNOSIS — K5140 Inflammatory polyps of colon without complications: Secondary | ICD-10-CM

## 2024-04-16 DIAGNOSIS — D123 Benign neoplasm of transverse colon: Secondary | ICD-10-CM

## 2024-04-16 DIAGNOSIS — D122 Benign neoplasm of ascending colon: Secondary | ICD-10-CM

## 2024-04-16 DIAGNOSIS — K648 Other hemorrhoids: Secondary | ICD-10-CM | POA: Diagnosis not present

## 2024-04-16 DIAGNOSIS — Z1211 Encounter for screening for malignant neoplasm of colon: Secondary | ICD-10-CM | POA: Diagnosis present

## 2024-04-16 DIAGNOSIS — Z860100 Personal history of colon polyps, unspecified: Secondary | ICD-10-CM

## 2024-04-16 MED ORDER — SODIUM CHLORIDE 0.9 % IV SOLN: 500.0000 mL | Freq: Once | INTRAVENOUS | Status: DC

## 2024-04-16 NOTE — Patient Instructions (Signed)
 Educational handout provided to patient related to Hemorrhoids & Polyps  Resume previous diet  Continue present medications  Awaiting pathology results  REPEAT COLONOSCOPY IN 3 YEARS FOR SURVEILLANCE    YOU HAD AN ENDOSCOPIC PROCEDURE TODAY AT THE [facility]:   Refer to the procedure report that was given to you for any specific questions about what was found during the examination.  If the procedure report does not answer your questions, please call your gastroenterologist to clarify.  If you requested that your care partner not be given the details of your procedure findings, then the procedure report has been included in a sealed envelope for you to review at your convenience later.  YOU SHOULD EXPECT: Some feelings of bloating in the abdomen. Passage of more gas than usual.  Walking can help get rid of the air that was put into your GI tract during the procedure and reduce the bloating. If you had a lower endoscopy (such as a colonoscopy or flexible sigmoidoscopy) you may notice spotting of blood in your stool or on the toilet paper. If you underwent a bowel prep for your procedure, you may not have a normal bowel movement for a few days.  Please Note:  You might notice some irritation and congestion in your nose or some drainage.  This is from the oxygen used during your procedure.  There is no need for concern and it should clear up in a day or so.  SYMPTOMS TO REPORT IMMEDIATELY:  Following lower endoscopy (colonoscopy or flexible sigmoidoscopy):  Excessive amounts of blood in the stool  Significant tenderness or worsening of abdominal pains  Swelling of the abdomen that is new, acute  Fever of 100F or higher  For urgent or emergent issues, a gastroenterologist can be reached at any hour by calling [phone]. Do not use MyChart messaging for urgent concerns.    DIET:  We do recommend a small meal at first, but then you may proceed to your regular diet.  Drink  plenty of fluids but you should avoid alcoholic beverages for 24 hours.  ACTIVITY:  You should plan to take it easy for the rest of today and you should NOT DRIVE or use heavy machinery until tomorrow (because of the sedation medicines used during the test).    FOLLOW UP: Our staff will call the number listed on your records the next business day following your procedure.  We will call around 7:15- 8:00 am to check on you and address any questions or concerns that you may have regarding the information given to you following your procedure. If we do not reach you, we will leave a message.     If any biopsies were taken you will be contacted by phone or by letter within the next 1-3 weeks.  Please call us  at [phone] if you have not heard about the biopsies in 3 weeks.    SIGNATURES/CONFIDENTIALITY: You and/or your care partner have signed paperwork which will be entered into your electronic medical record.  These signatures attest to the fact that that the information above on your After Visit Summary has been reviewed and is understood.  Full responsibility of the confidentiality of this discharge information lies with you and/or your care-partner.

## 2024-04-16 NOTE — Progress Notes (Signed)
 Called to room to assist during endoscopic procedure.  Patient ID and intended procedure confirmed with present staff. Received instructions for my participation in the procedure from the performing physician.

## 2024-04-16 NOTE — Progress Notes (Signed)
 GASTROENTEROLOGY PROCEDURE H&P NOTE   Primary Care Physician: Bair, Kalpana, MD    Reason for Procedure:  History of multiple adenomatous colon polyps  Plan:    Surveillance colonoscopy  Patient is appropriate for endoscopic procedure(s) in the ambulatory ([facility]) setting.  The nature of the procedure, as well as the risks, benefits, and alternatives were carefully and thoroughly reviewed with the patient. Ample time for discussion and questions allowed. The patient understood, was satisfied, and agreed to proceed.     HPI: Paul Fields is a 63 y.o. male who presents for surveillance colonoscopy.  Medical history as below.  Tolerated the prep.  No recent chest pain or shortness of breath.  No abdominal pain today.  Past Medical History:  Diagnosis Date   Arthritis    Clavicle fracture 1986   COPD (chronic obstructive pulmonary disease) (HCC)    Diverticulosis    Elbow fracture, right 1968   Emphysema lung (HCC)    Hemorrhoid    Major depressive disorder, recurrent episode (HCC) 03/07/2016   when mom passed    Polyp of colon, adenomatous    possible based on genetic testing    Past Surgical History:  Procedure Laterality Date   COLONOSCOPY     COLONOSCOPY W/ BIOPSIES  04/2015   polyps-benign   ELBOW FRACTURE SURGERY Right 1967   Pins, then hardware removal   ELBOW HARDWARE REMOVAL Right 1969   right   INTERCOSTAL NERVE BLOCK Left 03/14/2022   Procedure: INTERCOSTAL NERVE BLOCK;  Surgeon: Zelphia Higashi, MD;  Location[facility] OR;  Service: Thoracic;  Laterality: Left;   LYMPH NODE DISSECTION Left 03/14/2022   Procedure: LYMPH NODE DISSECTION;  Surgeon: Zelphia Higashi, MD;  Location: [facility] OR;  Service: Thoracic;  Laterality: Left;   POLYPECTOMY     polyps  2013   20 polyps biopsies as tubular adenomas   right toe surgery  2018   WISDOM TOOTH EXTRACTION  1983    Prior to Admission medications   Medication Sig Start Date End Date Taking? Authorizing  Provider  albuterol  (VENTOLIN  HFA) 108 (90 Base) MCG/ACT inhaler Inhale 2 puffs into the lungs every 6 (six) hours as needed for wheezing or shortness of breath. 10/08/23  Yes Marc Senior, MD  rosuvastatin  (CRESTOR ) 20 MG tablet Take 1 tablet (20 mg total) by mouth daily. 03/25/24  Yes Bair, Kalpana, MD  Fluticasone -Umeclidin-Vilant (TRELEGY ELLIPTA ) 100-62.5-25 MCG/ACT AEPB Inhale 1 puff into the lungs daily. 10/29/23   Marc Senior, MD    Current Outpatient Medications  Medication Sig Dispense Refill   albuterol  (VENTOLIN  HFA) 108 (90 Base) MCG/ACT inhaler Inhale 2 puffs into the lungs every 6 (six) hours as needed for wheezing or shortness of breath. 8 g 2   rosuvastatin  (CRESTOR ) 20 MG tablet Take 1 tablet (20 mg total) by mouth daily. 90 tablet 0   Fluticasone -Umeclidin-Vilant (TRELEGY ELLIPTA ) 100-62.5-25 MCG/ACT AEPB Inhale 1 puff into the lungs daily. 28 each 11   Current Facility-Administered Medications  Medication Dose Route Frequency Provider Last Rate Last Admin   0.9 %  sodium chloride  infusion  500 mL Intravenous Once Evianna Chandran, Amber Bail, MD        Allergies as of 04/16/2024   (No Known Allergies)    Family History  Problem Relation Age of Onset   COPD Father    Arthritis Father    Parkinsonism Mother    Diabetes Mother        borderline   Lung cancer  Mother    Cancer Mother        Lung   Colon cancer Maternal Uncle        diagnosed in his 72s   Alzheimer's disease Paternal Aunt    COPD Paternal Uncle    Lung cancer Maternal Grandfather        coal miner   Prostate cancer Maternal Uncle        diagnosed in his 75s   Alzheimer's disease Paternal Aunt    Heart attack Paternal Uncle    Brain cancer Paternal Uncle 32   Lymphoma Cousin        paternal cousin   COPD Brother        prostate   Cancer Paternal Grandfather        lung   Heart disease Neg Hx    Stomach cancer Neg Hx    Rectal cancer Neg Hx    Colon polyps Neg Hx    Esophageal cancer Neg Hx      Social History   Socioeconomic History   Marital status: Married    Spouse name: Edwina Gram   Number of children: 3   Years of education: Not on file   Highest education level: Associate degree: occupational, Scientist, product/process development, or vocational program  Occupational History   Occupation: truck Air traffic controller: [employer]   Tobacco Use   Smoking status: Every Day    Current packs/day: 3.00    Average packs/day: 3.0 packs/day for 45.0 years (135.0 ttl pk-yrs)    Types: Cigarettes   Smokeless tobacco: Never   Tobacco comments:    1 PPD khj 01/08/2024    Started smoking in 1978    Heaviest was 3PPD  Vaping Use   Vaping status: Never Used  Substance and Sexual Activity   Alcohol use: No    Comment: History of 6 pk beer on weekends- Last [facility] 11/2015   Drug use: No   Sexual activity: Not on file  Other Topics Concern   Not on file  Social History Narrative   Not on file   Social Drivers of Health   Financial Resource Strain: Medium Risk (03/23/2024)   Overall Financial Resource Strain (CARDIA)    Difficulty of Paying Living Expenses: Somewhat hard  Food Insecurity: No Food Insecurity (03/23/2024)   Hunger Vital Sign    Worried About Running Out of Food in the Last Year: Never true    Ran Out of Food in the Last Year: Never true  Transportation Needs: No Transportation Needs (03/23/2024)   PRAPARE - Administrator, Civil Service (Medical): No    Lack of Transportation (Non-Medical): No  Physical Activity: Sufficiently Active (03/23/2024)   Exercise Vital Sign    Days of Exercise per Week: 7 days    Minutes of Exercise per Session: 120 min  Stress: No Stress Concern Present (03/23/2024)   [employer] of Occupational Health - Occupational Stress Questionnaire    Feeling of Stress : Not at all  Social Connections: Unknown (03/23/2024)   Social Connection and Isolation Panel    Frequency of Communication with Friends and Family: Twice a week    Frequency of Social  Gatherings with Friends and Family: More than three times a week    Attends Religious Services: Patient declined    Database administrator or Organizations: Yes    Attends Banker Meetings: Never    Marital Status: Married  Catering manager Violence: Not on file  Physical Exam: Vital signs in last 24 hours: @BP  125/75   Pulse 75   Temp 98.2 F (36.8 C) (Temporal)   SpO2 97%  GEN: NAD EYE: Sclerae anicteric ENT: MMM CV: Non-tachycardic Pulm: CTA b/l GI: Soft, NT/ND NEURO:  Alert & Oriented x 3   Laurell Pond, MD [facility]  04/16/2024 10:50 AM

## 2024-04-16 NOTE — Progress Notes (Signed)
 Pt's states no medical or surgical changes since previsit or office visit.

## 2024-04-16 NOTE — Op Note (Signed)
 [facility] Patient Name: Paul Fields Procedure Date: 04/16/2024 10:47 AM MRN: [id number] Endoscopist: Nannette Babe , MD, [id number] Age: 63 Referring MD:  Date of Birth: 23-Apr-1961 Gender: Male Account #: [id number] Procedure:                Colonoscopy Indications:              High risk colon cancer surveillance: Personal                            history of multiple adenomas, Last colonoscopy:                            January 2020 (TA x 5), June 2016 (TA x 5), Dec 2013                            (TA x 9), Sept 2013 (TA and SSP x 22) Medicines:                Monitored Anesthesia Care Procedure:                Pre-Anesthesia Assessment:                           - Prior to the procedure, a History and Physical                            was performed, and patient medications and                            allergies were reviewed. The patient's tolerance of                            previous anesthesia was also reviewed. The risks                            and benefits of the procedure and the sedation                            options and risks were discussed with the patient.                            All questions were answered, and informed consent                            was obtained. Prior Anticoagulants: The patient has                            taken no anticoagulant or antiplatelet agents. ASA                            Grade Assessment: II - A patient with mild systemic                            disease. After reviewing the risks and benefits,  the patient was deemed in satisfactory condition to                            undergo the procedure.                           After obtaining informed consent, the colonoscope                            was passed under direct vision. Throughout the                            procedure, the patient's blood pressure, pulse, and                            oxygen saturations were  monitored continuously. The                            Colonoscope was introduced through the anus and                            advanced to the cecum, identified by appendiceal                            orifice and ileocecal valve. The colonoscopy was                            performed without difficulty. The patient tolerated                            the procedure well. The quality of the bowel                            preparation was good. The ileocecal valve,                            appendiceal orifice, and rectum were photographed. Scope In: 10:59:05 AM Scope Out: 11:21:50 AM Scope Withdrawal Time: 0 hours 19 minutes 4 seconds  Total Procedure Duration: 0 hours 22 minutes 45 seconds  Findings:                 The digital rectal exam was normal.                           Two sessile polyps were found in the ascending                            colon. The polyps were 4 to 5 mm in size. These                            polyps were removed with a cold snare. Resection                            and retrieval were complete.  Four sessile polyps were found in the transverse                            colon. The polyps were 5 to 15 mm in size. These                            polyps were removed with a cold snare. Resection                            and retrieval were complete.                           A 3 mm polyp was found in the sigmoid colon. The                            polyp was sessile. The polyp was removed with a                            cold snare. Resection and retrieval were complete.                           Internal hemorrhoids were found during                            retroflexion. The hemorrhoids were small. Complications:            No immediate complications. Estimated Blood Loss:     Estimated blood loss was minimal. Impression:               - Two 4 to 5 mm polyps in the ascending colon,                            removed  with a cold snare. Resected and retrieved.                           - Four 5 to 15 mm polyps in the transverse colon,                            removed with a cold snare. Resected and retrieved.                           - One 3 mm polyp in the sigmoid colon, removed with                            a cold snare. Resected and retrieved.                           - Small internal hemorrhoids. Recommendation:           - Patient has a contact number available for                            emergencies. The signs and symptoms of potential  delayed complications were discussed with the                            patient. Return to normal activities tomorrow.                            Written discharge instructions were provided to the                            patient.                           - Resume previous diet.                           - Continue present medications.                           - Await pathology results.                           - Repeat colonoscopy in 3 years for surveillance. Nannette Babe, MD 04/16/2024 11:25:47 AM This report has been signed electronically.

## 2024-04-16 NOTE — Progress Notes (Signed)
 Transferred to PACU, Arousable, stable.

## 2024-04-19 ENCOUNTER — Telehealth: Payer: Self-pay

## 2024-04-19 NOTE — Telephone Encounter (Signed)
  Follow up Call-     04/16/2024   10:12 AM  Call back number  Post procedure Call Back phone  # [phone]  Permission to leave phone message Yes     Patient questions:  Do you have a fever, pain , or abdominal swelling? No. Pain Score  0 *  Have you tolerated food without any problems? Yes.    Have you been able to return to your normal activities? Yes.    Do you have any questions about your discharge instructions: Diet   No. Medications  No. Follow up visit  No.  Do you have questions or concerns about your Care? No.  Actions: * If pain score is 4 or above: No action needed, pain <4.

## 2024-04-20 LAB — SURGICAL PATHOLOGY

## 2024-04-26 ENCOUNTER — Ambulatory Visit: Payer: Self-pay | Admitting: Internal Medicine

## 2024-05-19 ENCOUNTER — Encounter: Payer: Self-pay | Admitting: Pulmonary Disease

## 2024-05-19 ENCOUNTER — Ambulatory Visit

## 2024-05-19 ENCOUNTER — Ambulatory Visit: Payer: Self-pay

## 2024-05-19 ENCOUNTER — Ambulatory Visit: Admitting: Pulmonary Disease

## 2024-05-19 VITALS — BP 118/68 | HR 63 | Temp 97.7°F | Resp 20 | Ht 70.0 in | Wt 149.0 lb

## 2024-05-19 VITALS — BP 100/60 | HR 58 | Temp 98.1°F | Ht 70.0 in | Wt 149.6 lb

## 2024-05-19 DIAGNOSIS — J449 Chronic obstructive pulmonary disease, unspecified: Secondary | ICD-10-CM

## 2024-05-19 DIAGNOSIS — Z79899 Other long term (current) drug therapy: Secondary | ICD-10-CM | POA: Diagnosis not present

## 2024-05-19 DIAGNOSIS — J4489 Other specified chronic obstructive pulmonary disease: Secondary | ICD-10-CM

## 2024-05-19 DIAGNOSIS — E782 Mixed hyperlipidemia: Secondary | ICD-10-CM

## 2024-05-19 DIAGNOSIS — F17210 Nicotine dependence, cigarettes, uncomplicated: Secondary | ICD-10-CM | POA: Diagnosis not present

## 2024-05-19 LAB — LIPID PANEL
Cholesterol: 118 mg/dL (ref 0–200)
HDL: 57.3 mg/dL (ref >39.00)
LDL Cholesterol: 50 mg/dL (ref 0–99)
NonHDL: 60.48
Total CHOL/HDL Ratio: 2
Triglycerides: 50 mg/dL (ref 0.0–149.0)
VLDL: 10 mg/dL (ref 0.0–40.0)

## 2024-05-19 LAB — HEPATIC FUNCTION PANEL
ALT: 19 U/L (ref 0–53)
AST: 13 U/L (ref 0–37)
Albumin: 4.4 g/dL (ref 3.5–5.2)
Alkaline Phosphatase: 50 U/L (ref 39–117)
Bilirubin, Direct: 0.1 mg/dL (ref 0.0–0.3)
Total Bilirubin: 0.5 mg/dL (ref 0.2–1.2)
Total Protein: 6.4 g/dL (ref 6.0–8.3)

## 2024-05-19 MED ORDER — ROSUVASTATIN CALCIUM 20 MG PO TABS: 20.0000 mg | ORAL_TABLET | Freq: Every day | ORAL | 3 refills | Status: AC

## 2024-05-19 NOTE — Assessment & Plan Note (Signed)
 For primary prevention of ASCVD. Tolerating Rosuvastatin  20 mg well, continue.

## 2024-05-19 NOTE — Progress Notes (Signed)
 Subjective:    Patient ID: Paul Fields, male    DOB: May 10, 1961, 63 y.o.   MRN: [id number]  Patient Care Team: Bair, Kalpana, MD as PCP - General (Family Medicine) Tamea Dedra CROME, MD as Consulting Physician (Pulmonary Disease)  Chief Complaint  Patient presents with   Follow-up    COPD/Asthma, pulmonary granuloma, active smoker, CT: 03/14/24, PFT: 02/22/22, 01/01/24  No changes in his breathing so far, but wife has noted more labored breathing.     BACKGROUND/INTERVAL: Patient is a 63 year old current smoker (1 PPD, 67.5 PY) who presents for follow-up on the issue of COPD and a left upper lobe lung nodule.  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  He was last seen here on 08 January 2024. He does have moderate COPD.  He unfortunately continues to smoke 1 pack of cigarettes per day.  He presents today for follow-up.   HPI Discussed the use of AI scribe software for clinical note transcription with the patient, who gave verbal consent to proceed.  History of Present Illness   Paul Fields is a 63 year old male with COPD and asthma overlap who presents for follow-up of his respiratory conditions.  He presents today with his wife, Olam.  He has a history of COPD with asthma overlap and a prior pulmonary granuloma status post thoracotomy. He experiences difficulty sleeping and 'shallowness' of breath, particularly during physically demanding tasks such as carrying heavy loads. His breathing function decreased slightly in March. He is due for a repeat pulmonary function test in March next year.  He has a significant family history of COPD. His mother died at the age of 58 from the condition, and several of his uncles also had COPD, with seven of them passing away by the age of 58.  He smokes a pack of cigarettes a day and has attempted to quit smoking in the past using various methods, including butterscotch candies, Jolly Ranchers, and nicotine   patches. He previously tried Chantix  but found it ineffective. Nicotine  patches were helpful during a past attempt to quit drinking. He is currently not allowed to smoke in the house due to the presence of grandchildren and limits his smoking to the porch. Despite these efforts, he continues to smoke regularly, which impacts his ability to engage in activities such as watching a movie without interruption.   He does not endorse any increased shortness of breath from baseline.  No fevers, chills or sweats.  No cough or sputum production.  No hemoptysis.  No orthopnea or paroxysmal nocturnal dyspnea.  No lower extremity edema.  No chest pain.  Weight and appetite have been stable.     DATA 02/03/2018 PFTs: FEV1 2.10 L or 58% predicted, FVC 3.40 L or 75% predicted, FEV1/FVC 62%, significant bronchodilator response both in FEV1 and reduced air trapping.  Lung volumes show significant air trapping at 158% fusion capacity normal. 05/05/2018 PFTs: FEV1 2.59 L or 71% predicted, FVC 3.98 L or 88% predicted, FEV1/FVC 65%, no significant bronchodilator response, diffusion capacity normal.  Hyperinflation and air trapping noted. 01/17/2021 CT chest: Calcified mediastinal nodes, 6 mm posterior medial right upper lobe groundglass nodule and 0.6 mm nodule in the right upper lobe. 04/17/2021 PFTs: FEV1 2.50 L or 75% predicted, FVC 4.01 L or 91% predicted, FEV1/FVC 62%. Hyperinflation and air trapping noted.  All airways component with bronchodilator response.  Diffusion capacity mildly reduced. 08/31/2021 CT chest: Interval development of a 7 mm irregular  left upper lobe pulmonary nodule noncontrasted CT 3 to 6 months is recommended other pulmonary nodules stable.  Patient scheduled for a 3 month follow-up. 01/04/2022 CT chest: Interval enlargement of the previously noted left upper lobe pulmonary nodule measuring 1.2 x 0.5 cm. 01/22/2022 PET/CT: Hypermetabolic nodule in the left upper lobe concerning for primary  bronchogenic carcinoma.  No evidence of mediastinal nodal metastasis.  No distant metastatic disease.  Scattered pulmonary nodules and calcified small mediastinal lymph nodes consistent with prior granulomatous disease. 03/14/2022 surgical pathology (wedge resection): Necrotizing granuloma.  Cultures negative. 01/01/2024 PFTs: FEV1 2.35 L or 67% predicted, FVC 4.17 L or 89% predicted, FEV1/FVC was 56%.  There was a 17% change in FEV1 after bronchodilator.  Lung volumes show hyperinflation and air trapping diffusion capacity is mildly reduced.  Review of Systems A 10 point review of systems was performed and it is as noted above otherwise negative.   Patient Active Problem List   Diagnosis Date Noted   Pneumothorax after biopsy 02/14/2022    Priority: 1.   Nodule of upper lobe of left lung 04/04/2016    Priority: 2.   Centrilobular emphysema (HCC) 01/14/2018    Priority: 3.   Nicotine  dependence with current use 05/27/2012    Priority: 4.   On statin therapy 05/19/2024   Mixed hyperlipidemia 03/25/2024   Onychophagia 03/24/2024   Need for tetanus booster 03/24/2024   Prediabetes 03/24/2024   S/P robot-assisted surgical procedure 03/14/2022   Vertigo 05/11/2021   Fixed pupil of left eye 01/09/2021   Prostate cancer screening 12/19/2019   GERD (gastroesophageal reflux disease) 04/24/2018   Personal history of tobacco use, presenting hazards to health 03/14/2016   Hx of adenomatous colonic polyps 08/11/2012    Social History   Tobacco Use   Smoking status: Every Day    Current packs/day: 1.00    Average packs/day: 3.0 packs/day for 47.6 years (141.9 ttl pk-yrs)    Types: Cigarettes    Start date: 1978   Smokeless tobacco: Never   Tobacco comments:    1 PPD khj 01/08/2024    Started smoking in 1978    Heaviest was 3PPD  Substance Use Topics   Alcohol use: No    Comment: History of 6 pk beer on weekends- Last [facility] 11/2015    No Known Allergies  Current Meds  Medication Sig    albuterol  (VENTOLIN  HFA) 108 (90 Base) MCG/ACT inhaler Inhale 2 puffs into the lungs every 6 (six) hours as needed for wheezing or shortness of breath.   Fluticasone -Umeclidin-Vilant (TRELEGY ELLIPTA ) 100-62.5-25 MCG/ACT AEPB Inhale 1 puff into the lungs daily.   rosuvastatin  (CRESTOR ) 20 MG tablet Take 1 tablet (20 mg total) by mouth daily.    Immunization History  Administered Date(s) Administered   Influenza,inj,Quad PF,6+ Mos 09/10/2013   PFIZER Comirnaty(Gray Top)Covid-19 Tri-Sucrose Vaccine 02/06/2020, 02/27/2020   PNEUMOCOCCAL CONJUGATE-20 01/09/2021   Pneumococcal Polysaccharide-23 05/27/2012   Tdap 05/27/2012, 03/24/2024   Zoster Recombinant(Shingrix) 01/09/2021, 04/17/2021        Objective:     BP 100/60 (BP Location: Right Arm, Patient Position: Sitting, Cuff Size: Normal)   Pulse (!) 58   Temp 98.1 F (36.7 C) (Oral)   Ht 5' 10 (1.778 m)   Wt 149 lb 9.6 oz (67.9 kg)   SpO2 98%   BMI 21.47 kg/m   SpO2: 98 %  GENERAL: Thin well-developed gentleman in no acute distress, fully ambulatory.  No conversational dyspnea.   HEAD: Normocephalic, atraumatic. EARS: Left otitis media  and externa, Ruman impaction on right. EYES: Pupils equal, round, reactive to light.  No scleral icterus. MOUTH: Poor dentition, oral mucosa moist. NECK: Supple. No thyromegaly. Trachea midline. No JVD.  No adenopathy. PULMONARY: Good air entry bilaterally.  Scattered wheezes and rhonchi noted. CARDIOVASCULAR: S1 and S2. Regular rate and rhythm.  No rubs, murmurs or gallops heard. ABDOMEN: Benign. MUSCULOSKELETAL: No joint deformity, no clubbing, no edema. NEUROLOGIC: No focal deficit, no gait disturbance, speech is fluent. SKIN: Intact,warm,dry.  On limited exam, no rashes. PSYCH: Mood and behavior normal.        Assessment & Plan:     ICD-10-CM   1. COPD, moderate (HCC)  J44.9     2. COPD with asthma (HCC)  J44.89     3. Tobacco dependence due to cigarettes  F17.210       Discussion:    COPD with asthma overlap, moderate severity Recent pulmonary function tests in March indicated decreased function, yet classified as moderate. Smoking cessation is crucial due to family history of COPD-related deaths. - Recommend smoking cessation using 21 mg nicotine  patches due to smoking a pack a day. Apply in the morning and remove before bed to avoid nightmares/sleep disruption. - Consider using FUM device for distraction and fidgeting, it has no additives or chemicals. - Schedule repeat pulmonary function tests in March next year. - Patient has lung cancer screening CT scheduled for October. - Follow up in 6 months.   Tobacco dependence due to cigarettes Patient was counseled regards to discontinuation of smoking.  Provided patient with resources to assist in smoking cessation.  Advised if symptoms do not improve or worsen, to please contact office for sooner follow up or seek emergency care.    I spent 32 minutes of dedicated to the care of this patient on the date of this encounter to include pre-visit review of records, face-to-face time with the patient discussing conditions above, post visit ordering of testing, clinical documentation with the electronic health record, making appropriate referrals as documented, and communicating necessary findings to members of the patients care team.    C. Leita Sanders, MD Advanced Bronchoscopy PCCM [facility]    *This note was generated using voice recognition software/Dragon and/or AI transcription program.  Despite best efforts to proofread, errors can occur which can change the meaning. Any transcriptional errors that result from this process are unintentional and may not be fully corrected at the time of dictation.

## 2024-05-19 NOTE — Progress Notes (Signed)
 Established Patient Office Visit   Subjective  Patient ID: Paul Fields, male    DOB: December 04, 1960  Age: 63 y.o. MRN: [id number]  Chief Complaint  Patient presents with   Medication Screening    Follow up to see how he is doing on Statin    He  has a past medical history of Arthritis, Clavicle fracture (1986), COPD (chronic obstructive pulmonary disease) (HCC), Diverticulosis, Elbow fracture, right (1968), Emphysema lung (HCC), Emphysema of lung (HCC), Hemorrhoid, Left rib fracture (04/24/2022), Major depressive disorder, recurrent episode (HCC) (03/07/2016), Polyp of colon, adenomatous, and Weight decreasing (03/24/2024).  HPI Discussed the use of AI scribe software for clinical note transcription with the patient, who gave verbal consent to proceed.  History of Present Illness Paul Fields is a 63 year old male who presents for follow-up on cholesterol management. He is accompanied by his family.  He is currently on rosuvastatin  20 mg (started on 03/25/24) for cholesterol management with no side effects.   His potassium levels, previously noted to be elevated, have normalized. He maintains an active lifestyle, exercises regularly, and follows a healthy diet, avoiding junk food. No new concerns.    ROS As per HPI    Objective:     BP 118/68   Pulse 63   Temp 97.7 F (36.5 C)   Resp 20   Ht 5' 10 (1.778 m)   Wt 149 lb (67.6 kg)   SpO2 98%   BMI 21.38 kg/m      05/19/2024    8:08 AM 03/24/2024   11:08 AM 06/18/2022   10:13 AM  Depression screen PHQ 2/9  Decreased Interest 0 0 0  Down, Depressed, Hopeless 0 0 0  PHQ - 2 Score 0 0 0  Altered sleeping 0 0   Tired, decreased energy 0 0   Change in appetite 0 0   Feeling bad or failure about yourself  0 0   Trouble concentrating 0 0   Moving slowly or fidgety/restless 0 0   Suicidal thoughts 0 0   PHQ-9 Score 0 0   Difficult doing work/chores Not difficult at all Not difficult at all       05/19/2024     8:08 AM 03/24/2024   11:09 AM 06/18/2022   10:13 AM  GAD 7 : Generalized Anxiety Score  Nervous, Anxious, on Edge 0 0 0  Control/stop worrying 0 0 0  Worry too much - different things 0 0 0  Trouble relaxing 0 0 0  Restless 0 0 0  Easily annoyed or irritable 0 0 0  Afraid - awful might happen 0 0 0  Total GAD 7 Score 0 0 0  Anxiety Difficulty Not difficult at all Not difficult at all Not difficult at all      05/19/2024    8:08 AM 03/24/2024   11:08 AM 06/18/2022   10:13 AM  Depression screen PHQ 2/9  Decreased Interest 0 0 0  Down, Depressed, Hopeless 0 0 0  PHQ - 2 Score 0 0 0  Altered sleeping 0 0   Tired, decreased energy 0 0   Change in appetite 0 0   Feeling bad or failure about yourself  0 0   Trouble concentrating 0 0   Moving slowly or fidgety/restless 0 0   Suicidal thoughts 0 0   PHQ-9 Score 0 0   Difficult doing work/chores Not difficult at all Not difficult at all       05/19/2024  8:08 AM 03/24/2024   11:09 AM 06/18/2022   10:13 AM  GAD 7 : Generalized Anxiety Score  Nervous, Anxious, on Edge 0 0 0  Control/stop worrying 0 0 0  Worry too much - different things 0 0 0  Trouble relaxing 0 0 0  Restless 0 0 0  Easily annoyed or irritable 0 0 0  Afraid - awful might happen 0 0 0  Total GAD 7 Score 0 0 0  Anxiety Difficulty Not difficult at all Not difficult at all Not difficult at all   SDOH Screenings   Food Insecurity: No Food Insecurity (05/18/2024)  Housing: Unknown (05/18/2024)  Transportation Needs: No Transportation Needs (05/18/2024)  Depression (PHQ2-9): Low Risk  (05/19/2024)  Financial Resource Strain: Low Risk  (05/18/2024)  Recent Concern: Financial Resource Strain - Medium Risk (03/23/2024)  Physical Activity: Sufficiently Active (05/18/2024)  Social Connections: Unknown (05/18/2024)  Stress: No Stress Concern Present (05/18/2024)  Tobacco Use: High Risk (05/19/2024)     Physical Exam Constitutional:      General: He is not in acute  distress.    Appearance: Normal appearance.  HENT:     Head: Normocephalic and atraumatic.     Mouth/Throat:     Mouth: Mucous membranes are moist.  Neck:     Thyroid : No thyroid  mass or thyroid  tenderness.  Cardiovascular:     Rate and Rhythm: Normal rate and regular rhythm.  Pulmonary:     Effort: Pulmonary effort is normal.     Breath sounds: Normal breath sounds.  Abdominal:     General: Bowel sounds are normal.     Palpations: Abdomen is soft.     Tenderness: There is no abdominal tenderness. There is no guarding or rebound.  Musculoskeletal:     Cervical back: Neck supple. No rigidity or tenderness.     Right lower leg: No edema.     Left lower leg: No edema.  Skin:    General: Skin is warm.  Neurological:     Mental Status: He is alert and oriented to person, place, and time.  Psychiatric:        Mood and Affect: Mood normal.        Behavior: Behavior normal.        No results found for any visits on 05/19/24.  The 10-year ASCVD risk score (Arnett DK, et al., 2019) is: 12.5%     Assessment & Plan:  Patient is a pleasant 63 year old male with hyperlipidemia, prediabetes presenting for f/u after initiating statin therapy for hyperlipidemia.   Mixed hyperlipidemia Assessment & Plan: Tolerating Rosuvastatin  20 mg daily. Will obtain hepatic function and lipid panel, anticipate improvement in LDL cholesterol. Labs ordered, refill sent.   Orders: -     Lipid panel -     Hepatic function panel -     Rosuvastatin  Calcium ; Take 1 tablet (20 mg total) by mouth daily.  Dispense: 90 tablet; Refill: 3  On statin therapy Assessment & Plan: For primary prevention of ASCVD. Tolerating Rosuvastatin  20 mg well, continue.   Orders: -     Lipid panel -     Hepatic function panel    Return in about 6 months (around 11/19/2024) for Chronic follow up .   Luke Shade, MD

## 2024-05-19 NOTE — Assessment & Plan Note (Signed)
 Tolerating Rosuvastatin  20 mg daily. Will obtain hepatic function and lipid panel, anticipate improvement in LDL cholesterol. Labs ordered, refill sent.

## 2024-05-19 NOTE — Patient Instructions (Addendum)
 VISIT SUMMARY:  You came in today for a follow-up on your respiratory conditions, specifically COPD with asthma overlap. We discussed your current symptoms, family history, and smoking habits. We also reviewed your recent pulmonary function tests and made plans for future tests and imaging.  YOUR PLAN:  -COPD WITH ASTHMA OVERLAP: COPD with asthma overlap is a condition where symptoms of both chronic obstructive pulmonary disease and asthma are present. Your recent pulmonary function tests showed moderate severity. It's important to quit smoking due to your family history of COPD-related deaths. We recommend using 21 mg nicotine  patches, applying them in the morning and removing them before bed to avoid nightmares. You might also consider using a FUM (tryfum.com) device for distraction and fidgeting, as it is flavorless. We will schedule repeat pulmonary function tests in March next year and a chest CT in October.  You can also use the 1-800-QUIT-NOW website or phone line which is through the [facility] and can also help with quitting smoking.  INSTRUCTIONS:  Please follow up in 6 months for a re-evaluation. Make sure to schedule your chest CT in October and your repeat pulmonary function tests in March next year.

## 2024-08-25 ENCOUNTER — Ambulatory Visit
Admission: RE | Admit: 2024-08-25 | Discharge: 2024-08-25 | Disposition: A | Discharge status: Home / Self Care | Source: Ambulatory Visit | Attending: Acute Care | Admitting: Acute Care

## 2024-08-25 DIAGNOSIS — Z122 Encounter for screening for malignant neoplasm of respiratory organs: Secondary | ICD-10-CM | POA: Diagnosis present

## 2024-08-25 DIAGNOSIS — Z87891 Personal history of nicotine dependence: Secondary | ICD-10-CM | POA: Insufficient documentation

## 2024-08-25 DIAGNOSIS — R911 Solitary pulmonary nodule: Secondary | ICD-10-CM | POA: Diagnosis present

## 2024-09-01 ENCOUNTER — Telehealth: Payer: Self-pay | Admitting: *Deleted

## 2024-09-01 DIAGNOSIS — R911 Solitary pulmonary nodule: Secondary | ICD-10-CM

## 2024-09-01 NOTE — Telephone Encounter (Signed)
 Lauraine Lites, NP has reviewed lung screening CT. Please call pt and advise we will repeat CT in 6 months. Send results to PCP. Place order for 6 month nodule follow up CT.

## 2024-09-01 NOTE — Telephone Encounter (Signed)
 Attempted to call patient and review recent Lung CT results, no answer. LVM.

## 2024-09-03 NOTE — Telephone Encounter (Signed)
 Spoke with pt. Informed him of the results and recommendations for his LDCT. Patient aware of the new or more distinct nodules noted thus the reason for a 6 month follow up scan instead of a yearly. Order placed for follow up scan. Results and plan sent to PCP.

## 2024-11-04 ENCOUNTER — Ambulatory Visit: Payer: Self-pay

## 2024-11-04 NOTE — Telephone Encounter (Signed)
 FYI Only or Action Required?: Action required by provider: request for appointment.  Patient was last seen in primary care on 05/19/2024 by Abbey Bruckner, MD.  Called Nurse Triage reporting Shortness of Breath.  Symptoms began several months ago.  Interventions attempted: Nothing.  Symptoms are: gradually worsening. States SOB x 3 months , mainly in morning but getting worse. Has wheezing. Appointment made in February due to work schedule.  Triage Disposition: See PCP When Office is Open (Within 3 Days)  Patient/caregiver understands and will follow disposition?: Yes     Copied from CRM #[id number]. Topic: Clinical - Red Word Triage >> Nov 04, 2024  1:30 PM Rilla B wrote: [facility] that prompted transfer to Nurse Triage: Diff Breathing worsening    ----------------------------------------------------------------------- From previous Reason for Contact - Scheduling: Patient/patient representative is calling to schedule an appointment. Refer to attachments for appointment information. Reason for Disposition  [1] MODERATE longstanding difficulty breathing (e.g., speaks in phrases, SOB even at rest, pulse 100-120) AND [2] SAME as normal  Answer Assessment - Initial Assessment Questions 1. RESPIRATORY STATUS: Describe your breathing? (e.g., wheezing, shortness of breath, unable to speak, severe coughing)      SOB worse in morning, getting worse 2. ONSET: When did this breathing problem begin?      3 months 3. PATTERN Does the difficult breathing come and go, or has it been constant since it started?      Comes and goes 4. SEVERITY: How bad is your breathing? (e.g., mild, moderate, severe)      moderate 5. RECURRENT SYMPTOM: Have you had difficulty breathing before? If Yes, ask: When was the last time? and What happened that time?      yes 6. CARDIAC HISTORY: Do you have any history of heart disease? (e.g., heart attack, angina, bypass surgery, angioplasty)       NO 7. LUNG HISTORY: Do you have any history of lung disease?  (e.g., pulmonary embolus, asthma, emphysema)     COPD 8. CAUSE: What do you think is causing the breathing problem?      UNSURE 9. OTHER SYMPTOMS: Do you have any other symptoms? (e.g., chest pain, cough, dizziness, fever, runny nose)     WHEEZING 10. O2 SATURATION MONITOR:  Do you use an oxygen saturation monitor (pulse oximeter) at home? If Yes, ask: What is your reading (oxygen level) today? What is your usual oxygen saturation reading? (e.g., 95%)       no 11. PREGNANCY: Is there any chance you are pregnant? When was your last menstrual period?       N/a 12. TRAVEL: Have you traveled out of the country in the last month? (e.g., travel history, exposures)       no  Protocols used: Breathing Difficulty-A-AH

## 2024-11-04 NOTE — Telephone Encounter (Signed)
 Noted. Nothing further needed.

## 2024-12-16 ENCOUNTER — Ambulatory Visit: Admitting: Pulmonary Disease
